# Patient Record
Sex: Female | Born: 1940
Health system: Southern US, Community
[De-identification: ages and names within clinical notes are randomized; demographics above are authoritative.]

## PROBLEM LIST (undated history)

## (undated) DIAGNOSIS — R002 Palpitations: Secondary | ICD-10-CM

## (undated) DIAGNOSIS — K219 Gastro-esophageal reflux disease without esophagitis: Secondary | ICD-10-CM

## (undated) DIAGNOSIS — R079 Chest pain, unspecified: Secondary | ICD-10-CM

## (undated) DIAGNOSIS — K5790 Diverticulosis of intestine, part unspecified, without perforation or abscess without bleeding: Secondary | ICD-10-CM

## (undated) DIAGNOSIS — T7840XA Allergy, unspecified, initial encounter: Secondary | ICD-10-CM

## (undated) DIAGNOSIS — H6060 Unspecified chronic otitis externa, unspecified ear: Secondary | ICD-10-CM

## (undated) DIAGNOSIS — K635 Polyp of colon: Secondary | ICD-10-CM

## (undated) HISTORY — DX: Palpitations: R00.2

## (undated) HISTORY — DX: Chest pain, unspecified: R07.9

## (undated) HISTORY — DX: Allergy, unspecified, initial encounter: T78.40XA

## (undated) HISTORY — DX: Polyp of colon: K63.5

## (undated) HISTORY — DX: Unspecified chronic otitis externa, unspecified ear: H60.60

## (undated) HISTORY — DX: Gastro-esophageal reflux disease without esophagitis: K21.9

## (undated) HISTORY — PX: BREAST SURGERY: SHX581

## (undated) HISTORY — PX: COLONOSCOPY W/ BIOPSIES: SHX1374

## (undated) HISTORY — PX: BUNIONECTOMY: SHX129

## (undated) HISTORY — DX: Diverticulosis of intestine, part unspecified, without perforation or abscess without bleeding: K57.90

---

## 1984-01-31 HISTORY — PX: CATARACT EXTRACTION, BILATERAL: SHX1313

## 2002-04-13 ENCOUNTER — Encounter: Payer: Self-pay | Admitting: Emergency Medicine

## 2002-04-13 ENCOUNTER — Observation Stay (HOSPITAL_COMMUNITY): Admission: EM | Admit: 2002-04-13 | Discharge: 2002-04-14 | Payer: Self-pay | Admitting: Emergency Medicine

## 2002-04-13 ENCOUNTER — Encounter: Payer: Self-pay | Admitting: Orthopedic Surgery

## 2004-05-16 ENCOUNTER — Ambulatory Visit: Payer: Self-pay | Admitting: Family Medicine

## 2004-06-13 ENCOUNTER — Ambulatory Visit: Payer: Self-pay | Admitting: Family Medicine

## 2004-11-30 ENCOUNTER — Encounter (INDEPENDENT_AMBULATORY_CARE_PROVIDER_SITE_OTHER): Payer: Self-pay | Admitting: *Deleted

## 2004-11-30 LAB — CONVERTED CEMR LAB

## 2004-12-01 ENCOUNTER — Ambulatory Visit: Payer: Self-pay | Admitting: Family Medicine

## 2005-01-03 ENCOUNTER — Encounter: Admission: RE | Admit: 2005-01-03 | Discharge: 2005-01-03 | Payer: Self-pay | Admitting: Sports Medicine

## 2005-09-18 ENCOUNTER — Ambulatory Visit: Payer: Self-pay | Admitting: Family Medicine

## 2005-09-18 HISTORY — PX: FRACTURE SURGERY: SHX138

## 2005-09-19 ENCOUNTER — Encounter: Admission: RE | Admit: 2005-09-19 | Discharge: 2005-09-19 | Payer: Self-pay | Admitting: Sports Medicine

## 2005-12-26 ENCOUNTER — Ambulatory Visit: Payer: Self-pay | Admitting: Family Medicine

## 2006-01-24 ENCOUNTER — Ambulatory Visit: Payer: Self-pay | Admitting: Family Medicine

## 2006-01-31 ENCOUNTER — Ambulatory Visit: Payer: Self-pay | Admitting: Sports Medicine

## 2006-01-31 ENCOUNTER — Encounter (INDEPENDENT_AMBULATORY_CARE_PROVIDER_SITE_OTHER): Payer: Self-pay | Admitting: Family Medicine

## 2006-01-31 LAB — CONVERTED CEMR LAB
ALT: 32 units/L (ref 0–35)
AST: 26 units/L (ref 0–37)
Albumin: 4.5 g/dL (ref 3.5–5.2)
Alkaline Phosphatase: 97 units/L (ref 39–117)
BUN: 14 mg/dL (ref 6–23)
CO2: 26 meq/L (ref 19–32)
Calcium: 9.7 mg/dL (ref 8.4–10.5)
Chloride: 104 meq/L (ref 96–112)
Cholesterol: 247 mg/dL — ABNORMAL HIGH (ref 0–200)
Creatinine, Ser: 0.74 mg/dL (ref 0.40–1.20)
Glucose, Bld: 80 mg/dL (ref 70–99)
HDL: 52 mg/dL (ref >39)
LDL Cholesterol: 154 mg/dL — ABNORMAL HIGH (ref 0–99)
Potassium: 4.2 meq/L (ref 3.5–5.3)
Sodium: 142 meq/L (ref 135–145)
TSH: 2.425 microintl units/mL (ref 0.350–5.50)
Total Bilirubin: 0.6 mg/dL (ref 0.3–1.2)
Total CHOL/HDL Ratio: 4.8
Total Protein: 7.4 g/dL (ref 6.0–8.3)
Triglycerides: 206 mg/dL — ABNORMAL HIGH (ref <150)
VLDL: 41 mg/dL — ABNORMAL HIGH (ref 0–40)

## 2006-02-15 ENCOUNTER — Encounter (INDEPENDENT_AMBULATORY_CARE_PROVIDER_SITE_OTHER): Payer: Self-pay | Admitting: Family Medicine

## 2006-02-15 ENCOUNTER — Ambulatory Visit: Payer: Self-pay | Admitting: Family Medicine

## 2006-02-28 ENCOUNTER — Encounter: Admission: RE | Admit: 2006-02-28 | Discharge: 2006-02-28 | Payer: Self-pay | Admitting: Sports Medicine

## 2006-03-29 DIAGNOSIS — H269 Unspecified cataract: Secondary | ICD-10-CM

## 2006-03-30 ENCOUNTER — Encounter (INDEPENDENT_AMBULATORY_CARE_PROVIDER_SITE_OTHER): Payer: Self-pay | Admitting: *Deleted

## 2006-09-19 HISTORY — PX: CHOLECYSTECTOMY: SHX55

## 2006-11-27 ENCOUNTER — Ambulatory Visit: Payer: Self-pay | Admitting: Family Medicine

## 2006-11-27 DIAGNOSIS — I10 Essential (primary) hypertension: Secondary | ICD-10-CM | POA: Insufficient documentation

## 2006-11-27 DIAGNOSIS — G44209 Tension-type headache, unspecified, not intractable: Secondary | ICD-10-CM | POA: Insufficient documentation

## 2006-11-27 DIAGNOSIS — H9190 Unspecified hearing loss, unspecified ear: Secondary | ICD-10-CM | POA: Insufficient documentation

## 2006-11-27 HISTORY — DX: Tension-type headache, unspecified, not intractable: G44.209

## 2006-11-27 HISTORY — DX: Unspecified hearing loss, unspecified ear: H91.90

## 2006-11-27 HISTORY — DX: Essential (primary) hypertension: I10

## 2006-11-29 ENCOUNTER — Encounter (INDEPENDENT_AMBULATORY_CARE_PROVIDER_SITE_OTHER): Payer: Self-pay | Admitting: *Deleted

## 2006-12-04 ENCOUNTER — Encounter (INDEPENDENT_AMBULATORY_CARE_PROVIDER_SITE_OTHER): Payer: Self-pay | Admitting: Family Medicine

## 2006-12-04 ENCOUNTER — Ambulatory Visit: Payer: Self-pay | Admitting: Family Medicine

## 2006-12-04 DIAGNOSIS — F529 Unspecified sexual dysfunction not due to a substance or known physiological condition: Secondary | ICD-10-CM | POA: Insufficient documentation

## 2006-12-04 LAB — CONVERTED CEMR LAB
ALT: 26 units/L (ref 0–35)
AST: 23 units/L (ref 0–37)
Albumin: 4.5 g/dL (ref 3.5–5.2)
Alkaline Phosphatase: 86 units/L (ref 39–117)
BUN: 16 mg/dL (ref 6–23)
CO2: 27 meq/L (ref 19–32)
Calcium: 9.6 mg/dL (ref 8.4–10.5)
Chloride: 106 meq/L (ref 96–112)
Cholesterol: 251 mg/dL — ABNORMAL HIGH (ref 0–200)
Creatinine, Ser: 0.85 mg/dL (ref 0.40–1.20)
Glucose, Bld: 86 mg/dL (ref 70–99)
HDL: 46 mg/dL (ref >39)
LDL Cholesterol: 159 mg/dL — ABNORMAL HIGH (ref 0–99)
Potassium: 4.2 meq/L (ref 3.5–5.3)
Sodium: 144 meq/L (ref 135–145)
Total Bilirubin: 0.5 mg/dL (ref 0.3–1.2)
Total CHOL/HDL Ratio: 5.5
Total Protein: 7.4 g/dL (ref 6.0–8.3)
Triglycerides: 229 mg/dL — ABNORMAL HIGH (ref <150)
VLDL: 46 mg/dL — ABNORMAL HIGH (ref 0–40)

## 2006-12-18 ENCOUNTER — Encounter: Admission: RE | Admit: 2006-12-18 | Discharge: 2006-12-18 | Payer: Self-pay | Admitting: Family Medicine

## 2006-12-18 ENCOUNTER — Ambulatory Visit: Payer: Self-pay | Admitting: Family Medicine

## 2006-12-18 DIAGNOSIS — M1990 Unspecified osteoarthritis, unspecified site: Secondary | ICD-10-CM | POA: Insufficient documentation

## 2006-12-18 DIAGNOSIS — M479 Spondylosis, unspecified: Secondary | ICD-10-CM

## 2006-12-18 HISTORY — DX: Spondylosis, unspecified: M47.9

## 2007-03-13 ENCOUNTER — Encounter: Admission: RE | Admit: 2007-03-13 | Discharge: 2007-03-13 | Payer: Self-pay | Admitting: Family Medicine

## 2007-07-22 ENCOUNTER — Ambulatory Visit: Payer: Self-pay | Admitting: Family Medicine

## 2007-11-14 ENCOUNTER — Encounter (INDEPENDENT_AMBULATORY_CARE_PROVIDER_SITE_OTHER): Payer: Self-pay | Admitting: Family Medicine

## 2007-11-14 ENCOUNTER — Ambulatory Visit: Payer: Self-pay | Admitting: Family Medicine

## 2007-11-14 DIAGNOSIS — R05 Cough: Secondary | ICD-10-CM

## 2007-11-14 DIAGNOSIS — K219 Gastro-esophageal reflux disease without esophagitis: Secondary | ICD-10-CM

## 2007-11-14 HISTORY — DX: Gastro-esophageal reflux disease without esophagitis: K21.9

## 2007-11-14 LAB — CONVERTED CEMR LAB
ALT: 23 units/L (ref 0–35)
AST: 25 units/L (ref 0–37)
Albumin: 4.6 g/dL (ref 3.5–5.2)
Alkaline Phosphatase: 95 units/L (ref 39–117)
BUN: 11 mg/dL (ref 6–23)
CO2: 27 meq/L (ref 19–32)
Calcium: 9.6 mg/dL (ref 8.4–10.5)
Chloride: 105 meq/L (ref 96–112)
Creatinine, Ser: 0.7 mg/dL (ref 0.40–1.20)
Direct LDL: 168 mg/dL — ABNORMAL HIGH
Glucose, Bld: 83 mg/dL (ref 70–99)
H Pylori IgG: NEGATIVE
Potassium: 4.4 meq/L (ref 3.5–5.3)
Sodium: 141 meq/L (ref 135–145)
Total Bilirubin: 0.9 mg/dL (ref 0.3–1.2)
Total Protein: 7.3 g/dL (ref 6.0–8.3)

## 2007-11-25 ENCOUNTER — Ambulatory Visit: Payer: Self-pay | Admitting: Family Medicine

## 2007-11-25 ENCOUNTER — Encounter (INDEPENDENT_AMBULATORY_CARE_PROVIDER_SITE_OTHER): Payer: Self-pay | Admitting: Family Medicine

## 2008-05-18 ENCOUNTER — Encounter: Admission: RE | Admit: 2008-05-18 | Discharge: 2008-05-18 | Payer: Self-pay | Admitting: Family Medicine

## 2008-06-01 ENCOUNTER — Ambulatory Visit: Payer: Self-pay | Admitting: Family Medicine

## 2008-06-01 DIAGNOSIS — N39498 Other specified urinary incontinence: Secondary | ICD-10-CM

## 2008-06-01 HISTORY — DX: Other specified urinary incontinence: N39.498

## 2008-06-01 LAB — CONVERTED CEMR LAB
Direct LDL: 143 mg/dL — ABNORMAL HIGH
Vit D, 25-Hydroxy: 19 ng/mL — ABNORMAL LOW (ref 30–89)

## 2008-06-24 ENCOUNTER — Encounter: Payer: Self-pay | Admitting: Family Medicine

## 2008-09-07 ENCOUNTER — Encounter: Payer: Self-pay | Admitting: Family Medicine

## 2008-11-09 ENCOUNTER — Ambulatory Visit: Payer: Self-pay | Admitting: Family Medicine

## 2008-11-09 DIAGNOSIS — J45901 Unspecified asthma with (acute) exacerbation: Secondary | ICD-10-CM

## 2008-11-09 DIAGNOSIS — J301 Allergic rhinitis due to pollen: Secondary | ICD-10-CM | POA: Insufficient documentation

## 2008-11-09 HISTORY — DX: Unspecified asthma with (acute) exacerbation: J45.901

## 2008-11-09 HISTORY — DX: Allergic rhinitis due to pollen: J30.1

## 2008-12-01 ENCOUNTER — Ambulatory Visit: Payer: Self-pay | Admitting: Family Medicine

## 2008-12-03 ENCOUNTER — Encounter: Payer: Self-pay | Admitting: Family Medicine

## 2008-12-07 ENCOUNTER — Encounter: Payer: Self-pay | Admitting: Family Medicine

## 2009-06-21 ENCOUNTER — Ambulatory Visit: Payer: Self-pay | Admitting: Family Medicine

## 2009-06-21 DIAGNOSIS — L723 Sebaceous cyst: Secondary | ICD-10-CM

## 2009-06-23 ENCOUNTER — Encounter: Admission: RE | Admit: 2009-06-23 | Discharge: 2009-06-23 | Payer: Self-pay | Admitting: Family Medicine

## 2009-06-29 ENCOUNTER — Ambulatory Visit: Payer: Self-pay | Admitting: Family Medicine

## 2009-06-29 ENCOUNTER — Encounter: Payer: Self-pay | Admitting: Family Medicine

## 2009-11-01 ENCOUNTER — Ambulatory Visit: Payer: Self-pay | Admitting: Family Medicine

## 2009-11-21 ENCOUNTER — Encounter: Payer: Self-pay | Admitting: Family Medicine

## 2010-01-17 ENCOUNTER — Ambulatory Visit: Payer: Self-pay | Admitting: Family Medicine

## 2010-02-01 ENCOUNTER — Ambulatory Visit (HOSPITAL_COMMUNITY)
Admission: RE | Admit: 2010-02-01 | Discharge: 2010-02-01 | Payer: Self-pay | Source: Home / Self Care | Admitting: Family Medicine

## 2010-02-01 ENCOUNTER — Other Ambulatory Visit: Payer: Self-pay

## 2010-02-01 ENCOUNTER — Encounter: Payer: Self-pay | Admitting: Family Medicine

## 2010-02-01 ENCOUNTER — Ambulatory Visit: Admission: RE | Admit: 2010-02-01 | Discharge: 2010-02-01 | Payer: Self-pay | Source: Home / Self Care

## 2010-02-01 DIAGNOSIS — R002 Palpitations: Secondary | ICD-10-CM | POA: Insufficient documentation

## 2010-02-01 DIAGNOSIS — R5381 Other malaise: Secondary | ICD-10-CM | POA: Insufficient documentation

## 2010-02-01 DIAGNOSIS — R5383 Other fatigue: Secondary | ICD-10-CM

## 2010-02-01 HISTORY — DX: Palpitations: R00.2

## 2010-02-04 ENCOUNTER — Encounter: Payer: Self-pay | Admitting: Family Medicine

## 2010-02-04 ENCOUNTER — Ambulatory Visit: Admission: RE | Admit: 2010-02-04 | Discharge: 2010-02-04 | Payer: Self-pay | Source: Home / Self Care

## 2010-02-24 ENCOUNTER — Encounter: Payer: Self-pay | Admitting: Family Medicine

## 2010-02-24 LAB — CONVERTED CEMR LAB
ALT: 19 units/L (ref 0–35)
AST: 20 units/L (ref 0–37)
Albumin: 4 g/dL (ref 3.5–5.2)
Alkaline Phosphatase: 63 units/L (ref 39–117)
BUN: 13 mg/dL (ref 6–23)
CO2: 26 meq/L (ref 19–32)
Calcium: 8.8 mg/dL (ref 8.4–10.5)
Chloride: 107 meq/L (ref 96–112)
Cholesterol: 241 mg/dL — ABNORMAL HIGH (ref 0–200)
Creatinine, Ser: 0.74 mg/dL (ref 0.40–1.20)
Glucose, Bld: 78 mg/dL (ref 70–99)
HCT: 45.8 % (ref 36.0–46.0)
HDL: 50 mg/dL (ref >39)
Hemoglobin: 14.8 g/dL (ref 12.0–15.0)
LDL Cholesterol: 157 mg/dL — ABNORMAL HIGH (ref 0–99)
MCV: 91.2 fL (ref 78.0–100.0)
Platelets: 194 10*3/uL (ref 150–400)
RDW: 13.3 % (ref 11.5–15.5)
TSH: 1.388 microintl units/mL (ref 0.350–4.500)
Total Bilirubin: 0.8 mg/dL (ref 0.3–1.2)
Total CHOL/HDL Ratio: 4.8
Total Protein: 6.2 g/dL (ref 6.0–8.3)
Triglycerides: 168 mg/dL — ABNORMAL HIGH (ref <150)
VLDL: 34 mg/dL (ref 0–40)
WBC: 6.2 10*3/uL (ref 4.0–10.5)

## 2010-03-01 NOTE — Assessment & Plan Note (Signed)
Summary: boil on back shoulder/[place]   Vital Signs:  Patient profile:   70 year old female Menstrual status:  postmenopausal Height:      137 inches Weight:      128.3 pounds Temp:     98.1 degrees F Pulse rate:   80 / minute BP sitting:   122 / 76  Vitals Entered By: Golden Circle RN ([name] 21, 2009 2:57 PM)  Primary Care Provider:  Zachery Dauer MD   History of Present Illness: Visit conducted in [place].   Patient with longstanding (several years) epidermoid cyst on the R shoulder which had not been bothering her.  In the past 2 weeks the area has become tender and red; she reports a small amount of purulence oozing from the area in recent days.  Denies objective fevers or chills.  Denies feeling ill otherwise.   No known bleeding disorders, no anticoagulant medications.  Uses Ventolin as needed, not using now.   Current Medications (verified): 1)  Ibuprofen 200 Mg Tabs (Ibuprofen) .... 2 As Needed Headache 2)  Ventolin Hfa 108 (90 Base) Mcg/act Aers (Albuterol Sulfate) .... 2 Puffs As Needed Shortness of Breath  Allergies (verified): 1)  ! Amitriptyline Hcl 2)  Fish Oil (Fish Oil) 3)  * Simvastatin 4)  Lipitor (Atorvastatin Calcium)  Physical Exam  General:  well appearing, no apparent distress.  Neck:  Neck supple.  Msk:  R upper back with 1.5cm2 round fluctuant mass, erythematous and tender to touch.  No surrounding skin erythema. Not suppurative.    Additional Exam:  After discussing risks and benefits to I&D of infected epidermoid cyst, the patient gives verbal and written consent to proceed with procedure.  Consent done in Spanish, and patient given opportunity to ask questions.   Area prepped with betadine, infiltrated with 1%lidocaine with epinephrine.  After adequate anesthesia, 11=blade used to incise a 1cm incision across the most fluctuant part of the lesion.  Cheesy contents evacuated and the walls of the cyst are scraped with the scalpel.  EBL minimal.   Patient reports relief from pain of the distended capsule.  Tolerated procedure well.  Topical antibiotics and gauze covering applied by nursing staff. Given instructions on follow-up.     Impression & Recommendations:  Problem # 1:  EPIDERMOID CYST, BACK (ICD-706.2)  Epidermoid cyst on R upper back, measuring 1.5cm squared, with fluctuance and erythema.  Area incised and drained after verbal and written consent, and application of 1% lido with epinephrine.  Will recheck in 1 week or sooner if needed.  Instructions about indications for earlier follow-up.  We discussed risk of bleeding, infection, injury and recurrence of the cyst.    Orders: [facility]- Est Level  3 (16109) I&D Abcess, simple- [facility] (10060)  Complete Medication List: 1)  Ibuprofen 200 Mg Tabs (Ibuprofen) .... 2 as needed headache 2)  Ventolin Hfa 108 (90 Base) Mcg/act Aers (Albuterol sulfate) .... 2 puffs as needed shortness of breath  Patient Instructions: 1)  Fue un placer verle hoy.  Vito Backers abri' el quiste que tenia infestado en el hombro derecho.  Quiero que vuelva en una semana para chequearle de nuevo.  2)  Si Ud nota que se le pone muy rojo, si duele mucho o si supura pus, o si tiene Ud sintomas de [place], Dentist o se empeora de otra forma, por favor llame para ser atendida lo antes posible.  3)  Mantenga el area tapado por 24 horas despues de la visita de hoy. Entonces puede banarse.  Puede mantener el area tapado por algunos dias para evitar que se le frote con la ropa/brasier. 4)  FOLLOW UP APPT IN 1 WEEK WITH DR Mauricio Po. WOUND CHECK AFTER INCISION AND DRAINAGE

## 2010-03-01 NOTE — Miscellaneous (Signed)
Summary: asthma classification  Clinical Lists Changes  Problems: Changed problem from EXTRINSIC ASTHMA, WITH EXACERBATION (ICD-493.02) to ASTHMA, INTERMITTENT, MILD (ICD-493.90)

## 2010-03-01 NOTE — Miscellaneous (Signed)
Summary: Procedure Consent  Procedure Consent   Imported By: De Nurse 06/30/2009 16:22:17  _____________________________________________________________________  External Attachment:    Type:   Image     Comment:   External Document

## 2010-03-01 NOTE — Miscellaneous (Signed)
Summary: walk in   Clinical Lists Changes walked in with spouse. showed me a large eruption on L shoulder under brastrap. states it is painful. it is raised tense and red. they have a band aid on it. [facility] as there was a cancellation today. they are fine with waiting.. BP 122/76 p80 temp 98.1 wt 128.3.Marland KitchenMarland KitchenGolden Circle RN  [name] 21, 2009 2:36 PM

## 2010-03-01 NOTE — Assessment & Plan Note (Signed)
Summary: F/U WOUND/[facility]   Vital Signs:  Patient profile:   70 year old female Menstrual status:  postmenopausal Weight:      128 pounds Pulse rate:   77 / minute BP sitting:   134 / 82  (left arm)  Vitals Entered By: Arlyss Repress CMA, ([name] 29, 2009 8:38 AM) CC: f/up boil...on back.  Is Patient Diabetic? No Pain Assessment Patient in pain? yes     Location: back Intensity: 2   Primary Care Provider:  Zachery Dauer MD  CC:  f/up boil...on back. .  History of Present Illness: Visit in Spanish.  Husband Lawanna Kobus present for part of the visit.   Hendy reports that the site of I&D on R upper back is better than it was; still with an uncomfortable itching sensation.  No suppuration for a few days now.  Husband has been putting iodine on it.  No fevers or chills.   Reports that she gets short of breath almost nightly, has to use Albuterol HFA nearly every night.  Gets better with this. Worse from March to May/June.  Never had asthma before.  [facility] in Nov 2010 [facility] without reversibility with SABA use.  Attributed her shortness of breath to reflux.   She was seen in an urgent care on [address] in March for the shortness of breath, was told she had hypertension and started on HCTZ 12.5mg  daily.  She has been taking, and her BP is better controlled.   Habits & Providers  Alcohol-Tobacco-Diet     Tobacco Status: quit > 6 months     Tobacco Counseling: not to resume use of tobacco products  Current Medications (verified): 1)  Ibuprofen 200 Mg Tabs (Ibuprofen) .... 2 As Needed Headache 2)  Ventolin Hfa 108 (90 Base) Mcg/act Aers (Albuterol Sulfate) .... 2 Puffs As Needed Shortness of Breath 3)  Bactrim Ds 800-160 Mg Tabs (Sulfamethoxazole-Trimethoprim) .... 2 Tabs By Mouth Two Times A Day For 7 Days 4)  Hydrochlorothiazide 12.5 Mg Tabs (Hydrochlorothiazide) .Marland Kitchen.. 1 Cap By Mouth Daily  Allergies (verified): 1)  ! Amitriptyline Hcl 2)  Fish Oil (Fish Oil) 3)  * Simvastatin 4)   Lipitor (Atorvastatin Calcium)  Social History: Smoking Status:  quit > 6 months  Physical Exam  General:  well appearing, no apparent distress Neck:  Neck supple.  Lungs:  Good air movement.  Trace scattered expiratory wheezes without rales.  Heart:  Normal rate and regular rhythm. S1 and S2 normal without gallop, murmur, click, rub or other extra sounds. Skin:  R upper back; area of erythema that blanches, measures 1x1.5cm.  No suppuration.     Impression & Recommendations:  Problem # 1:  EPIDERMOID CYST, BACK (ICD-706.2)  Improved from date of I&D.  Plan to add systemic bactrim for 1 week due to what appears to be early cellulitis. No signs/symptoms of systemic infection.  Orders: [facility]- Est Level  3 (16109)  Problem # 2:  EXTRINSIC ASTHMA, WITH EXACERBATION (ICD-493.02)  Question RAD associated wtih GERD, also appears to have a seasonal component in Spring.  She reports subjective improvement with albuterol HFA, whcih she is using nearly daily.  May consider formal PFTs to see if corroborates the Spirometry done in this office in Nov 2010.  For now, to increase H1blocker use and schedule follow-up in the coming. month.  Her updated medication list for this problem includes:    Ventolin Hfa 108 (90 Base) Mcg/act Aers (Albuterol sulfate) .Marland Kitchen... 2 puffs as needed shortness of  breath  Orders: [facility]- Est Level  3 (95284)  Problem # 3:  INCREASED BLOOD PRESSURE (ICD-796.2) Given HCTZ at Urgent Care on [address] She has been taking daily. BP controlled today.  Continue for now.  Her updated medication list for this problem includes:    Hydrochlorothiazide 12.5 Mg Tabs (Hydrochlorothiazide) .Marland Kitchen... 1 cap by mouth daily  Complete Medication List: 1)  Ibuprofen 200 Mg Tabs (Ibuprofen) .... 2 as needed headache 2)  Ventolin Hfa 108 (90 Base) Mcg/act Aers (Albuterol sulfate) .... 2 puffs as needed shortness of breath 3)  Bactrim Ds 800-160 Mg Tabs (Sulfamethoxazole-trimethoprim) .... 2  tabs by mouth two times a day for 7 days 4)  Hydrochlorothiazide 12.5 Mg Tabs (Hydrochlorothiazide) .Marland Kitchen.. 1 cap by mouth daily  Patient Instructions: 1)  Fue un placer verle hoy.  Creo que la herida en la espalda se esta' sanando bien hoy.  Estoy recomendando que tome el antibiotico Bactrim DS, 2 tabletas dos veces por dia, por una semana.  2)  Continue usando la bombita de albuterol cuando la necesite para la dificultad en respirar.  3)  Puede usar un antihistaminico (Zyrtec) 10mg  una vez por dia.  4)  Elfredia Nevins que vuelva con el Dr Sheffield Slider para seguimiento del tema de los ronquidos y [place]. Prescriptions: BACTRIM DS 800-160 MG TABS (SULFAMETHOXAZOLE-TRIMETHOPRIM) 2 tabs by mouth two times a day for 7 days  #28 x 0   Entered and Authorized by:   Paula Compton MD   Signed by:   Paula Compton MD on 06/29/2009   Method used:   Electronically to        [facility] [phone]* (retail)       [address]       [place], [place]  [address]       Ph: [phone]       Fax: [phone]   RxID:   [id number]

## 2010-03-01 NOTE — Assessment & Plan Note (Signed)
Summary: FLU SHOT/[facility]  Nurse Visit   Vital Signs:  Patient profile:   70 year old female Menstrual status:  postmenopausal Temp:     98.5 degrees F  Vitals Entered By: Theresia Lo RN (November 01, 2009 4:30 PM)  Allergies: 1)  ! Amitriptyline Hcl 2)  Fish Oil (Fish Oil) 3)  * Simvastatin 4)  Lipitor (Atorvastatin Calcium)  Immunizations Administered:  Influenza Vaccine # 1:    Vaccine Type: Fluvax MCR    Site: left deltoid    Mfr: GlaxoSmithKline    Dose: 0.5 ml    Route: IM    Given by: Theresia Lo RN    Exp. Date: 07/27/2010    Lot #: [id number]    VIS given: 08/24/09 version given November 01, 2009.  Flu Vaccine Consent Questions:    Do you have a history of severe allergic reactions to this vaccine? no    Any prior history of allergic reactions to egg and/or gelatin? no    Do you have a sensitivity to the preservative Thimersol? no    Do you have a past history of Guillan-Barre Syndrome? no    Do you currently have an acute febrile illness? no    Have you ever had a severe reaction to latex? no    Vaccine information given and explained to patient? yes    Are you currently pregnant? no  Orders Added: 1)  Influenza Vaccine MCR [00025] 2)  Administration Flu vaccine - MCR [G0008]

## 2010-03-03 NOTE — Assessment & Plan Note (Signed)
Summary: F/U ON ASTHMA/[facility]   Vital Signs:  Patient profile:   70 year old female Menstrual status:  postmenopausal Height:      66.25 inches Weight:      122 pounds BMI:     19.61 Temp:     98.1 degrees F oral Pulse rate:   84 / minute BP sitting:   156 / 81  (left arm) BP standing:   130 / 76 Cuff size:   regular  Vitals Entered By: Tessie Fass CMA (February 01, 2010 2:54 PM)  Serial Vital Signs/Assessments:  Time      Position  BP       Pulse  Resp  Temp     By 3:08 PM             144/80                         Zachery Dauer MD           Lying LA  136/78                         Zachery Dauer MD           Standing  130/76                         Zachery Dauer MD                                PEF    PreRx  PostRx Time      O2 Sat  O2 Type     L/min  L/min  L/min   By 2:55 PM                       380                   Tessie Fass CMA 2:55 PM                       400                   Molly Maduro Busick CMA 2:55 PM                       380                   Molly Maduro Busick CMA  CC: F/U asthma   Primary Care Provider:  Zachery Dauer MD  CC:  F/U asthma.  History of Present Illness: Breathing much better. Finished Prednisone day before yesterday. Blowing up to 400 on peak flows.  Does have occasional chest pressure not associated with exertion and nocturnal cough.   Feels mildly dizzy with standing and vision gets blurry. Also some spinning feeling. One episode of palpitations  Feels week in the legs at times.   Allergies: 1)  ! Amitriptyline Hcl 2)  Fish Oil (Fish Oil) 3)  * Simvastatin 4)  Lipitor (Atorvastatin Calcium)  Physical Exam  General:  well appearing, no apparent distress Neck:  No thyromegaly Lungs:  normal respiratory effort, no crackles, and no wheezes.   Heart:  regular rhythm and no murmur.   Abdomen:  Mildly tender in the epigastrium that she attributes to cough.  Extremities:  No clubbing,  cyanosis, edema, or deformity noted with normal full range of  motion of all joints.     Impression & Recommendations:  Problem # 1:  ASTHMA, PERSISTENT, MILD (ICD-493.90) Much improved, but still consistent with mild persistent asthma and needs a controller The following medications were removed from the medication list:    Prednisone 20 Mg Tabs (Prednisone) .Marland Kitchen... Take 3 tabs daily for 3 days then 2 daily for 3 days then one daily till gone Her updated medication list for this problem includes:    Proventil Hfa 108 (90 Base) Mcg/act Aers (Albuterol sulfate) .Marland Kitchen... Take 2 puffs every 4 hr as needed    Ipratropium-albuterol 0.5-2.5 (3) Mg/34ml Soln (Ipratropium-albuterol) ..... Use in nebulizer every 4 hr as needed    Qvar 40 Mcg/act Aers (Beclomethasone dipropionate) .Marland Kitchen... Take 2 inhalations two times a day  Orders: [facility]- Est Level  3 (04540)  Problem # 2:  PALPITATIONS (ICD-785.1) EKG normal.Needs recheck of lipids Orders: EKG- [facility] (EKG) [facility]- Est Level  3 (99213)Future Orders: TSH-[facility] ([id number]) ... 02/04/2011  Complete Medication List: 1)  Ibuprofen 200 Mg Tabs (Ibuprofen) .... 2 as needed headache 2)  Proventil Hfa 108 (90 Base) Mcg/act Aers (Albuterol sulfate) .... Take 2 puffs every 4 hr as needed 3)  Ipratropium-albuterol 0.5-2.5 (3) Mg/94ml Soln (Ipratropium-albuterol) .... Use in nebulizer every 4 hr as needed 4)  Qvar 40 Mcg/act Aers (Beclomethasone dipropionate) .... Take 2 inhalations two times a day  Other Orders: Lipid-[facility] (95621-30865) Future Orders: CBC-[facility] (78469) ... 02/04/2011 Comp Met-[facility] ([id number]) ... 02/04/2011  Patient Instructions: 1)  Return in 2 days for fasting labs  2)  Please schedule a follow-up appointment in 1 month.  3)  Voy a [employer] [place]bas de [place]. Prescriptions: QVAR 40 MCG/ACT AERS (BECLOMETHASONE DIPROPIONATE) Take 2 inhalations two times a day  #1 x 11   Entered and Authorized by:   Zachery Dauer MD   Signed by:   Zachery Dauer MD on 02/01/2010   Method used:    Electronically to        [facility] [address] [phone]* (retail)       [address]       [address]       [place], [place]  [address]       Ph: [phone]       Fax: [phone]   RxID:   [id number] QVAR 40 MCG/ACT AERS (BECLOMETHASONE DIPROPIONATE) Take 2 inhalations two times a day  #1 x 11   Entered and Authorized by:   Zachery Dauer MD   Signed by:   Zachery Dauer MD on 02/01/2010   Method used:   [facility]ron[facility] to        [facility]  [address] [id number] (retail)       [address]       [place], [place]  [address]       Ph: [phone]       Fax: [phone]   RxID:   [id number]    Orders Added: 1)  EKG- [facility] [EKG] 2)  [facility]- Est Level  3 [99213] 3)  Lipid-[facility] [80061-22930] 4)  CBC-[facility] [85027] 5)  Comp Met-[facility] [80053-22900] 6)  TSH-[facility] [[id number]]

## 2010-03-03 NOTE — Assessment & Plan Note (Signed)
Summary: [place] for asthma attack and refill/kf   Vital Signs:  Patient profile:   70 year old female Menstrual status:  postmenopausal Height:      66.25 inches Weight:      122.4 pounds BMI:     19.68 O2 Sat:      94 % on Room air Temp:     99.1 degrees F oral Pulse rate:   80 / minute BP sitting:   130 / 80  (right arm)  Vitals Entered By: Arlyss Repress CMA, (January 17, 2010 1:54 PM)  O2 Flow:  Room air CC: asthma. cough x 3 weeks. Is Patient Diabetic? No Pain Assessment Patient in pain? yes     Location: chest Intensity: 8 Onset of pain  coughing    Primary Care Provider:  Zachery Dauer MD  CC:  asthma. cough x 3 weeks.Marland Kitchen  History of Present Illness: 3 weeks ago she had a uri with cough that didn't improve. 2 weeks ago she began increasing chest discomfort, dyspnea when lying, and wheezing. Despite using Alb/Ipratropium nebs (last at 8 AM)  or Albuterol MDI (last at noon) she increasingly feels that she is drowning. No fever. White phlegm. Poor appetite.   No ankle edema.   Interview conducted in Spanish  Asthma History    Initial Asthma Severity Rating:    Age range: 12+ years    Symptoms: >2 days/week; not daily    Nighttime Awakenings: >1/week but not nightly    Interferes w/ normal activity: some limitations    Asthma Severity Assessment: Moderate Persistent    Habits & Providers  Alcohol-Tobacco-Diet     Tobacco Status: never  Allergies: 1)  ! Amitriptyline Hcl 2)  Fish Oil (Fish Oil) 3)  * Simvastatin 4)  Lipitor (Atorvastatin Calcium)  Family History: MI, sisters x 5 alive, 2 had breast cancer - ages 21, 3 mother died at 30 of MI father AT 7 stroke, brother at 72  Social History: Live in [place] ,with husband (married for 30 years) from [place] in 2004 where she met her husband. 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    Jehova's  witnesses (do no received blood products) Born in [place] but husband born in [place].  Smoking Status:  never  Physical Exam  Mouth:  Oral mucosa and oropharynx without lesions or exudates.  Teeth in good repair. Lungs:  no crackles, R wheezes, and L wheezes.  Moderately prolonged exp. Initial peak flow max 210. Improved to 245 after alb/Ipra neb.  Heart:  Normal rate and regular rhythm. S1 and S2 normal without gallop, murmur, click, rub or other extra sounds. Abdomen:  soft, non-tender, no distention, no hepatomegaly, no splenomegaly, and RUQ abdominal surgical scar.     Impression & Recommendations:  Problem # 1:  ASTHMA, PERSISTENT, MILD (ICD-493.90) Acute exacerbation Her updated medication list for this problem includes:    Proventil Hfa 108 (90 Base) Mcg/act Aers (Albuterol sulfate) .Marland Kitchen... Take 2 puffs every 4 hr as needed    Ipratropium-albuterol 0.5-2.5 (3) Mg/71ml Soln (Ipratropium-albuterol) ..... Use in nebulizer every 4 hr as needed    Prednisone 20 Mg Tabs (Prednisone) .Marland Kitchen... Take 3 tabs daily for 3 days then 2 daily for 3 days then one daily till gone  Problem # 2:  ALLERGIC RHINITIS, SEASONAL (ICD-477.0)  Problem # 3:  GERD (ICD-530.81)  Orders: [facility]- Est  Level  4 (91478)  Complete Medication List: 1)  Ibuprofen 200 Mg Tabs (Ibuprofen) .... 2 as needed headache 2)  Proventil Hfa 108 (90 Base) Mcg/act Aers (Albuterol sulfate) .... Take 2 puffs every 4 hr as needed 3)  Ipratropium-albuterol 0.5-2.5 (3) Mg/79ml Soln (Ipratropium-albuterol) .... Use in nebulizer every 4 hr as needed 4)  Prednisone 20 Mg Tabs (Prednisone) .... Take 3 tabs daily for 3 days then 2 daily for 3 days then one daily till gone  Other Orders: Albuterol Sulfate Sol 1mg  unit dose (G9562) Atrovent 1mg  (Neb) [phone])  Patient Instructions: 1)  Usar albuterol por inhaladora o albuterol/Ipratropium por nebulizadora cada cuatro horas para asma 2)  Usar Prednisone para inflamacion en los  pulmones hasta esta has completado la receta.  3)  Va a la sala de urgencia si no puede soplar mas de 200 cc en la machina despues del tratamiento.  4)  Regrese el 3 de enero.  5)  Recheck Jan 3rd.  Prescriptions: PROVENTIL HFA 108 (90 BASE) MCG/ACT AERS (ALBUTEROL SULFATE) Take 2 puffs every 4 hr as needed  #1 x 11   Entered and Authorized by:   Zachery Dauer MD   Signed by:   Zachery Dauer MD on 01/17/2010   Method used:   Print then Give to Patient   RxID:   [id number] PREDNISONE 20 MG TABS (PREDNISONE) Take 3 tabs daily for 3 days then 2 daily for 3 days then one daily till gone  #21 x 0   Entered and Authorized by:   Zachery Dauer MD   Signed by:   Zachery Dauer MD on 01/17/2010   Method used:   Electronically to        [facility] [address] [phone]* (retail)       [address]       [address]       [place], [place]  [address]       Ph: [phone]       Fax: [phone]   RxID:   [id number] IPRATROPIUM-ALBUTEROL 0.5-2.5 (3) MG/3ML SOLN (IPRATROPIUM-ALBUTEROL) Use in nebulizer every 4 hr as needed  #1 box x 11   Entered and Authorized by:   Zachery Dauer MD   Signed by:   Zachery Dauer MD on 01/17/2010   Method used:   [facility]        [facility] [address] [phone]* (retail)       [address]       [place], [place]  [address]       Ph: [phone] or [phone]       Fax: [phone]   RxID:   [id number] PROVENTIL HFA 108 (90 BASE) MCG/ACT AERS (ALBUTEROL SULFATE) Take 2 puffs every 4 hr as needed  #1 x 11   Entered and Authorized by:   Zachery Dauer MD   Signed by:   Zachery Dauer MD on 01/17/2010   Method used:   [facility]ically to        [facility] [address] [phone]* (retail)       [address]       [place], [place]  [address]       Ph: [phone] or [phone]       Fax: [phone]   RxID:   [id number]    Medication Administration  Medication # 1:    Medication: Albuterol Sulfate Sol 1mg  unit dose  Diagnosis: ASTHMA, INTERMITTENT, MILD (ICD-493.90)    Dose: 2.5mg     Route: inhaled    Exp Date: 03/2011    Lot #: [id number]    Mfr: nephron    Comments: 2.5mg /7ml given    Patient tolerated medication without complications    Given by: Tessie Fass CMA (January 17, 2010 2:33 PM)  Medication # 2:    Medication: Atrovent 1mg  (Neb)    Diagnosis: ASTHMA, INTERMITTENT, MILD (ICD-493.90)    Dose: 0.5mg     Route: inhaled    Exp Date: 07/2011    Lot #: [id number]    Mfr: nephron    Comments: 0.5mg /2.21ml given    Patient tolerated medication without complications    Given by: Tessie Fass CMA (January 17, 2010 2:34 PM)  Orders Added: 1)  Albuterol Sulfate Sol 1mg  unit dose [J7613] 2)  Atrovent 1mg  (Neb) [F6213] 3)  [facility]- Est  Level 4 [08657]

## 2010-03-03 NOTE — Letter (Signed)
Summary: Results Follow-up Letter  [facility]  [address]   [place], [place] [address]   Phone: [phone]  Fax: [phone]    02/24/2010  [address] [place], [place]  [address]  Dear [name],   The following are the results of your recent test(s): Patient: Anne Warren Note: All result statuses are Final unless otherwise noted.  Tests: (1) CBC NO Diff (Complete Blood Count) (10000)   Order Note: FASTING   WBC                       6.2 K/uL                    4.0-10.5   RBC                       5.02 MIL/uL                 3.87-5.11   Hemoglobin                14.8 g/dL                   62.9-52.8   Hematocrit                45.8 %                      36.0-46.0   MCV                       91.2 fL                     78.0-100.0 ! MCH                       29.5 pg                     26.0-34.0   MCHC                      32.3 g/dL                   41.3-24.4   RDW                       13.3 %                      11.5-15.5   Platelet Count            194 K/uL                    150-400 No padece de anemia. Tests: (2) Comprehensive Metabolic Panel (01027)   Sodium                    142 mEq/L                   135-145   Potassium                 5.0 mEq/L                   3.5-5.3   Chloride                  107 mEq/L  96-112   CO2                       26 mEq/L                    19-32   Glucose                   78 mg/dL                    16-10   BUN                       13 mg/dL                    9-60   Creatinine                0.74 mg/dL                  0.40-1.20   Bilirubin, Total          0.8 mg/dL                   4.5-4.0   Alkaline Phosphatase      63 U/L                      39-117   AST/SGOT                  20 U/L                      0-37   ALT/SGPT                  19 U/L                      0-35   Total Protein             6.2 g/dL                    9.8-1.1   Albumin                   4.0 g/dL                     9.1-4.7   Calcium                   8.8 mg/dL                   8.2-95.6 La funcion del higado y rinones queda normal. Tests: (3) Lipid Profile (21308)   Cholesterol          [H]  241 mg/dL                   6-578     ATP III Classification:           < 200        mg/dL        Desirable          200 - 239     mg/dL        Borderline High          >= 240        mg/dL        High         Triglyceride         [  H]  168 mg/dL                   <161   HDL Cholesterol           50 mg/dL                    >09   Total Chol/HDL Ratio      4.8 Ratio  VLDL Cholesterol (Calc)                             34 mg/dL                    6-04  LDL Cholesterol (Calc)                        [H]  157 mg/dL                   5-40           Total Cholesterol/HDL Ratio:CHD Risk                            Coronary Heart Disease Risk Table                                            Men       Women              1/2 Average Risk              3.4        3.3                  Average Risk              5.0        4.4              2 X Average Risk              9.6        7.1              3 X Average Risk             23.4       11.0     Use the calculated Patient Ratio above and the CHD Risk table      to determine the patient's CHD Risk.     ATP III Classification (LDL):           < 100        mg/dL         Optimal          100 - 129     mg/dL         Near or Above Optimal          130 - 159     mg/dL         Borderline High          160 - 189     mg/dL         High           > 190        mg/dL  Very High El nivel de su colesterol malo, LDL queda alta. Porque no tiene otras factores de riesgo su meta es un nivel menos de 160. Muy importante evitar comida con grasa saturada.       Tests: (4) TSH (23280)   TSH                       1.388 uIU/mL                0.350-4.500 Funcion de la glandula tiroides esta normal. Document Creation Date: 02/04/2010 11:25 PM Sincerely,  Zachery Dauer MD [facility]           Appended Document: Results Follow-up Letter mailed

## 2010-03-03 NOTE — Miscellaneous (Signed)
Summary: Pt needs refill for proventil  Pt came by today and needs refill of Proventil. Anne Warren  January 17, 2010 1:31 PM    Done in office visit

## 2010-04-11 ENCOUNTER — Encounter: Payer: Self-pay | Admitting: Home Health Services

## 2010-04-21 ENCOUNTER — Encounter: Payer: Self-pay | Admitting: Family Medicine

## 2010-04-21 ENCOUNTER — Ambulatory Visit (INDEPENDENT_AMBULATORY_CARE_PROVIDER_SITE_OTHER): Payer: Medicare Other | Admitting: Family Medicine

## 2010-04-21 VITALS — BP 148/84 | HR 76 | Temp 98.0°F | Ht 59.25 in | Wt 123.0 lb

## 2010-04-21 DIAGNOSIS — M479 Spondylosis, unspecified: Secondary | ICD-10-CM

## 2010-04-21 DIAGNOSIS — J4530 Mild persistent asthma, uncomplicated: Secondary | ICD-10-CM

## 2010-04-21 DIAGNOSIS — R05 Cough: Secondary | ICD-10-CM

## 2010-04-21 DIAGNOSIS — M5412 Radiculopathy, cervical region: Secondary | ICD-10-CM

## 2010-04-21 DIAGNOSIS — J45909 Unspecified asthma, uncomplicated: Secondary | ICD-10-CM

## 2010-04-21 DIAGNOSIS — R030 Elevated blood-pressure reading, without diagnosis of hypertension: Secondary | ICD-10-CM

## 2010-04-21 DIAGNOSIS — G44209 Tension-type headache, unspecified, not intractable: Secondary | ICD-10-CM

## 2010-04-21 DIAGNOSIS — R5383 Other fatigue: Secondary | ICD-10-CM

## 2010-04-21 DIAGNOSIS — R059 Cough, unspecified: Secondary | ICD-10-CM

## 2010-04-21 HISTORY — DX: Radiculopathy, cervical region: M54.12

## 2010-04-21 MED ORDER — BECLOMETHASONE DIPROPIONATE 80 MCG/ACT IN AERS: 2.0000 | INHALATION_SPRAY | Freq: Two times a day (BID) | RESPIRATORY_TRACT | Status: DC

## 2010-04-21 MED ORDER — ACETAMINOPHEN 500 MG PO TABS: 1000.0000 mg | ORAL_TABLET | Freq: Four times a day (QID) | ORAL | Status: AC | PRN

## 2010-04-21 MED ORDER — GABAPENTIN 300 MG PO CAPS: 300.0000 mg | ORAL_CAPSULE | Freq: Three times a day (TID) | ORAL | Status: DC

## 2010-04-21 MED ORDER — PROMETHAZINE-DM 6.25-15 MG/5ML PO SYRP: 5.0000 mL | ORAL_SOLUTION | Freq: Four times a day (QID) | ORAL | Status: AC | PRN

## 2010-04-21 MED ORDER — PREDNISONE 20 MG PO TABS: ORAL_TABLET | ORAL | Status: DC

## 2010-04-21 MED ORDER — GABAPENTIN (ONCE-DAILY) 300 MG PO TABS: 1.0000 | ORAL_TABLET | Freq: Two times a day (BID) | ORAL | Status: DC | PRN

## 2010-04-21 NOTE — Assessment & Plan Note (Signed)
Put under asthma

## 2010-04-21 NOTE — Progress Notes (Signed)
  Subjective:    Patient ID: Anne Warren, female    DOB: 01/31/1941, 70 y.o.   MRN: [id number]  HPIShe has had worsening of her asthma sx recently with recurrence of her itchy eyes, sneezing, congestion that she generally has in the spring. She has been taking Zyrtec, but not consistently. Is taking the Qvar 40 and the Albuterol works well for rescue. The Ipratropium in the Duoneb seems to cause headache. Can't sleep at night due to the cough which also causes nausea. She requests more Phenergan cough syrup.  On the January visit after a course of prednisone, her peak flow was 400. Predicted for her height and age is 350. Excedrine headache medicine without caffeine has been helping.   She also has headache at other times with pain in her L neck and upper arm with numbness down into her L middle finger. These sx began 23 years ago after a fall onto her face and the doctor then said that she has a pinched nerve. No bowel or bladder or leg sx are now present.    history of injection years ago into her L palm for trigger finger sx, still has trouble straightening that middle finger at times.   Review of Systems     Objective:   Physical Exam  Constitutional: She is oriented to person, place, and time.  Eyes: Conjunctivae and EOM are normal. Pupils are equal, round, and reactive to light.    Neck: Normal range of motion. No mass and no thyromegaly present.    Cardiovascular: Normal rate, regular rhythm and normal heart sounds.   No murmur heard. Pulmonary/Chest: No accessory muscle usage. No respiratory distress. She has wheezes. She has no rales.  Neurological: She is alert and oriented to person, place, and time. She displays abnormal reflex. She displays no atrophy and no tremor. No cranial nerve deficit or sensory deficit. She exhibits normal muscle tone.  Reflex Scores:      Tricep reflexes are 1+ on the right side and 1+ on the left side.      Bicep reflexes are 1+ on the right side  and 1+ on the left side.      Brachioradialis reflexes are 1+ on the right side and 1+ on the left side.      Weaker flexion and extension on Left          Assessment & Plan:

## 2010-04-21 NOTE — Assessment & Plan Note (Signed)
Likely has spondylosis contributing to radiculopathy, Consider MRI if sx worsen or more definite weakness

## 2010-04-21 NOTE — Assessment & Plan Note (Signed)
Asthma exacerbation with more allergic rhinitis sx. Needs more controller medication. Ipratropium not working well and Duoneb causes headache.

## 2010-04-21 NOTE — Progress Notes (Signed)
  Subjective:    Patient ID: Anne Warren, female    DOB: 05/03/1940, 70 y.o.   MRN: [id number]  HPI    Review of Systems     Objective:   Physical Exam        Assessment & Plan:  Prior height was incorrect

## 2010-04-21 NOTE — Patient Instructions (Addendum)
Por el asma:  -Deje de usar Iprotropium por la nebulizadora.   -Vamos a Adult nurse QVAR (dos [employer] veces por dia)  Por las alergias: tome Zyrtec todos los dias.  Por el dolor de cuello: tome la medicine Neurontin antes de dormir.   Favor de regresar a Facilities manager. (FOLLOW-UP IN 3 WEEKS).

## 2010-04-21 NOTE — Assessment & Plan Note (Signed)
Ipratropium can cause this and she doesn't have and indication for its use so will discontinue. I recommended Acetaminophen

## 2010-04-21 NOTE — Assessment & Plan Note (Signed)
Systolic elevated today. Will recheck when not acutely ill

## 2010-04-21 NOTE — Assessment & Plan Note (Signed)
resolved 

## 2010-04-22 ENCOUNTER — Encounter: Payer: Self-pay | Admitting: Home Health Services

## 2010-04-22 ENCOUNTER — Ambulatory Visit (INDEPENDENT_AMBULATORY_CARE_PROVIDER_SITE_OTHER): Payer: Medicare Other | Admitting: Home Health Services

## 2010-04-22 VITALS — BP 139/83 | HR 80 | Temp 98.4°F | Ht 59.0 in | Wt 126.0 lb

## 2010-04-22 DIAGNOSIS — Z0000 Encounter for general adult medical examination without abnormal findings: Secondary | ICD-10-CM

## 2010-04-22 NOTE — Progress Notes (Signed)
Patient here for annual wellness visit, patient reports: Risk Factors/Conditions needing evaluation or treatment: Patient expressed concern about her last cholesterol test results.   Home Safety: Patient reported having smoke detectors and does not have adaptive equipment in bathroom.  Other Information: Corrective lens: Patient wears corrective lens daily and visits eye doctor annually.  Dentures: Patient has a partial and visits a dentist annually. Memory: Patient reports some memory loss.   Balance max value patientvalue  Sitting balance 1 1  Arise 2 2  Attempts to arise 2 2  Immediate standing balance 2 2  Standing balance 1 1  Nudge 2 2  Eyes closed 1 1  360 degree turn 1 1  Sitting down 2 2   Gait max value patient value  Initiation of gait 1 1  Step length-left 1 1  Step length-right 1 1  Step height-left 1 1  Step height-right 1 1  Step symmetry 1 1  Step continuity 1 1  Path 2 2  Trunk 2 2  Walking stance 1 1   Balance/Gait Score: 26/26 Patient did report some dizziness when standing with eyes close.   Mental Status Exam max value patient value  Orientation to time 5 5  Orientation to place 5 5  Registration 3 3  Attention 5 5  Recall 3 3  Language (name 2 objects) 2 2  Language-repeat 1 1  Language-follow 3 step command 3 3  Language-read and follow directions 1 1  Write a sentence    Draw a pentagon     Mental Status Exam:28/28   Annual Wellness Visit Requirements Recorded Today In  Medical, family, social history Past Medical, Family, Social History Section  Current providers Care team  Current medications Medications  Wt, BP, Ht, BMI Vital signs  Hearing assessment (welcome visit) Hearing/Vision  Tobacco, alcohol, illicit drug use History  ADL Nurse Assessment  Depression Screening Nurse Assessment  Cognitive impairment Nurse Assessment  Fall Risk Nurse Assessment  Home Safety Progress Note  End of Life Planning (welcome visit) Social  Documentation  Medicare preventative services Progress Note  Risk factors/conditions needing evaluation/treatment Progress Note  Personalized health advice Patient Instructions, goals, letter  Diet & Exercise Social Documentation  Emergency Contact Social Documentation  Seat Belts Social Documentation  Sun exposure/protection Social Documentation    Prevention Plan: Patient is up to date on recommended screenings.  Recommended Medicare Prevention Screenings Women over 61 Test For Frequency Date of Last- BOLD if needed  Breast Cancer 1-2 yrs 05/2009  Cervical Cancer 1-3 yrs 2006  Colorectal Cancer 1-10 yrs 2010  Osteoporosis once 2006  Cholesterol 5 yrs 2012  Diabetes yearly Non diabetic  HIV yearly declined  Influenza Shot yearly 10/2009  Pneumonia Shot once 10/2007  Zostavax Shot once [facility] patient pamphlet

## 2010-04-22 NOTE — Patient Instructions (Addendum)
1. Look into joining the [employer] program at [employer]. 2. Review the cholesterol information that I have included in the letter.                   Hipertrigliceridemia  Dieta para niveles altos de triglicridos en sangre (Hypertriglyceridemia, Diet for High  blood levels of Triglycerides) La mayora de las grasas que contienen los alimentos son los triglicridos. Los triglicridos en la sangre se almacenan como grasa en el cuerpo. Niveles altos de triglicridos en la sangre pueden ponerlo en un mayor riesgo de insuficiencia cardaca e ictus.  El nivel normal de triglicridos es menos del 150 mg/dl. Los niveles al lmite de ser altos estn entre 150 y 199 mg/dl. Los niveles altos estn entre 200 - 499 mg/dL, y los [address] altos son mayores que 500 mg/dL. La decisin de Pensions consultant se basa en Education officer, community de un nivel elevado de los mismos. Para personas con niveles de triglicridos altos o en el lmite, el tratamiento incluye prdida de peso y ejercicios. Se recomiendan los medicamentos en aquellas personas con niveles de triglicridos [employer]. Muchas personas que necesitan tratamiento por tener niveles altos de triglicridos tienen sndrome metablico. Esto consiste en un conjunto de trastornos que incluyen: resistencia a la insulina, presin arterial elevada, problemas de coagulacin, colesterol y triglicridos altos. PROCEDIMIENTO PARA EL ANLISIS DE LOS TRIGLICRIDOS  No deber comer nada durante las 4 horas antes del anlisis. El rango normal de triglicridos es de 10 a 250 miligramos por decilitro (mg/dl). Algunas personas podrn Gap Inc extremos (1000 o ms) pero el nivel de triglicridos es muy alto si est por encima de 150 mg/dl, dependiendo de qu otros factores de riesgo tiene para una enfermedad cardaca.   Las personas con niveles altos de triglicridos tambin podrn Warehouse manager altos niveles de colesterol. Si tiene [employer] de colesterol y de triglicridos, el  riesgo de falla cardaca es mayor que si slo tiene niveles altos de triglicridos. Los Ryerson Inc de colesterol es uno de los factores principales de riesgo de [place].  CAMBIOS EN SU DIETA Su peso puede afectar el nivel de triglicridos en la [place]. Si est un 20% o ms por encima de su peso ideal, deber Energy Transfer Partners de triglicridos mediante la prdida de [place]. La mejor forma de combatir esto es comer menos y Restaurant manager, fast food. Las grasas proporcionan ms caloras que cualquier otra comida. La mejor forma de perder peso es consumir menos grasa. Slo un 30% de las caloras totales debern provenir de grasas. Menos del 7% de las caloras totales de su dieta debern provenir de grasas saturadas. Una dieta baja en grasas y grasas saturadas es la misma que la que se realiza para Advertising account executive. Al consumir una dieta baja en grasas, perder peso, bajar sus niveles de colesterol y [place] de triglicridos.  Consumir una dieta baja en grasas, en especial grasas saturadas, tambin ayudar a su cuerpo a bajar el nivel de triglicridos. Pregunte al nutricionista como saber cunta grasa puede comer segn el nmero de caloras que el mdico le ha indicado.  El ejercicio, adems de ayudar en la prdida de peso tambin puede ayudarlo a bajar los [place] de triglicridos.   El alcohol puede aumentar los [place] de triglicridos. Podr ser necesario que deje de consumir bebidas alcohlicas.   Demasiada cantidad de carbohidratos en su dieta tambin puede aumentar su nivel de triglicridos en sangre. Algunos carbohidratos complejos son necesarios en su dieta. Entre ellos  se puede incluir pan, arroz, patatas, otros vegetales que posean almidn y cereales.   Reduzca los hidratos de carbono "simples". Esto puede incluir azcares puros, dulces, miel, y gelatina sin perder otros nutrientes. Si tiene el tipo de triglicridos altos que estn afectados por el tipo de carbohidratos en su dieta, necesitar  consumir menos azcar y menos alimentos con International aid/development worker. El profesional que lo asiste lo ayudar.   Aadir 2 a 4 gramos de aceite de pescado (AEP + ADH) tambin puede ayudar a disminuir triglicridos. Consulte con el mdico antes de aadir suplementos a su dieta.  Siga la dieta Mantenga un peso corporal adecuado. El profesional que lo asiste lo ayudar. En general, comer menos y hacer ms ejercicio lo ayudarn a Curator. Puede ser de utilidad que segn un grupo de apoyo para el control del [place]. Consulte con su especialista acerca de los grupos de [place] de control de peso en su zona.  Consuma comidas bajas en grasa. Esto tambin puede ayudar a [employer].  Estos alimentos son bajos en grasas. Coma MS alimentos de los siguientes:   Porotos y guisantes secos; lentejas.   Clara de [employer].   Requesn bajo en grasa   Pescado.   Cortes magros de carne, como [place], lomito, cuadril, Chiropodist (qutele la grasa extra).   Cereales, pastas y panes integrales.   Leche en polvo descremada sin grasa.   Yogur descremado.   Pollo sin piel.   Queso a base de [address] o semidescremada, como [place], parmesano, [place] fresco, ricota o requesn.   Estos son alimentos Counsellor. Coma MENOS alimentos de los siguientes:   Leche entera y derivados, como queso [place], [place], [place], [place] y [place].   Carnes altas en grasas, como fiambres, embutidos, salchichas de viena, bratwurst, costillas, carne en conserva, carne de cerdo picada y carne picada de vaca sin Herbalist.   Alimentos fritos.  Limite las grasas saturadas en su dieta. Sustituir las grasas insaturadas por saturadas puede ayudarlo a disminuir el nivel de triglicridos. Necesitar leer las etiquetas para saber qu productos contienen grasas saturadas.  Estos alimentos son altos en grasas saturadas. Coma MENOS alimentos de los siguientes:   [place].  Leche entera.   Piel y [place] de [place].   Aceite de palma.    [place].   [place].   Queso crema.   [place].   Margarinas y productos de repostera que contengan los aceites mencionados.   Margarinas vegetales.   Tripas de cerdo.  Grasas de carnes.   Aceite de coco.   Aceite de almendra de palma.   Grasa de cerdo.   [place]rem[place].   Nata.   [place] salado.   Blanqueadores para caf y cremas no lcteas hechas con los aceites mencionados.   Quesos de East[employer] Kodak.   Utilice grasas no saturadas (poliinsaturadas y monoinsaturadas) de forma moderada. Recuerde, aunque las grasas no saturadas son mejores que las saturadas, [place] debera consumir una dieta baja en grasas totales.  Estos alimentos son altos en grasas no saturadas:   Aceite de canola.  Aceite de girasol.   [place].   Almendras.   Manes   Pias.   Margarinas hechas con los aceites mencionados.   Aceite de Engineer, production.  Aceite de oliva.   [place]guac[place]   Castaas de caj.   [place] de man.   Semillas de girasol.   Aceite de soja  Aceite de man.   Aceitunas   Pacanes.   Trixie Deis de castilla.   Semillas de calabaza.  Evite el azcar y los alimentos altos en azcar. Esto disminuir los carbohidratos sin disminuir otros nutrientes. El azcar en la sangre va rpidamente al cerebro. Cuando hay un exceso de azcar en la sangre, el hgado podr [place] para producir ms triglicridos. Los azcares tambin contienen caloras con otros nutrientes importantes.  Coma MENOS alimentos de los siguientes:   International aid/development worker, azcar negro, azcar impalpable, jaleas, gelatinas, mermeladas, miel, almbar, melaza, pasteles, dulces, tortas, galletitas, glaseados, pasteles, bebidas cola, gaseosas, refrescos y jugos de frutas y gelatina normal.   Evite el alcohol. El alcohol puede aumentar los niveles de triglicridos, incluso ms que Insurance claims handler. Adems, el alcohol es alto en caloras y bajo en nutrientes. Pida agua mineral o una gaseosa de bajas caloras en vez de una bebida alcohlica.   Sugerencias para planear y preparar alimentos  [place]erva, hornee o ase las carnes en vez de frerlas.   Quite la grasa de la carne y la piel de las aves antes de cocinarlas.   Aada especias, hierbas, jugo de limn o vinagre a los vegetales en vez de sal, salsas pesadas o salsas de carne.   Utilice una sartn antiadherente sin grasas o use sprays antiadherentes.   Refrigere guisos y caldos. Luego elimine la grasa endurecida que flota en la superficie antes de servir.   Refrigere las grasas de la carne asada y desgrsela para hacer salsas de carne sin grasa.   Coma ms pescado.   Utilice menos [place], margarina y otras grasas en panes o vegetales.   Utilice leche en polvo descremada o reconstituida para cocinar.   Cocine con quesos bajos en grasas.   Sustituya el yogur bajo en grasa o queso cottage en toda o parte de las cremas de las recetas de salsas, bocaditos o ensaladas espesas.   Use mitad yogur y [employer].   Sustituya la crema por leche evaporada descremada. La crema batida puede sustituirse por leche evaporada descremada o reconstituida sin grasas en algunas recetas.   Elija frutas frescas para el postre en vez de alimentos altos en grasas como pasteles o tortas. Las frutas son naturalmente bajas en grasas.  Al comer afuera  Pida aperitivos bajos en grasa como jugos de frutas y vegetales, pasta con vegetales o salsa de [place].   Seleccione sopas normales en vez de sopas crema.   Pida que las salsas y condimentos se le sirvan a un costado. Use menos cantidad de ellos.   Pida comidas horneadas, hervidas, a fuego lento, al vapor, fritas en poco aceite o asadas.   Pida margarina en vez de [place], y [place] pequea cantidad.   Beba agua mineral, t o caf sin azcar o gaseosas dietticas en vez de alcohol o bebidas dulces.  PREGUNTAS Y RESPUESTAS ACERCA DE OTRAS GRASAS EN LA SANGRE:  GRASAS SATURADAS, GRASAS TRANS Y COLESTEROL Qu son las  grasas trans? Son un tipo de grasa que se forma cuando los aceites vegetales se endurecen a Glass blower/designer de un proceso llamado hidrogenacin. Este proceso ayuda a hacer los alimentos ms slidos, darles forma y Economist su conservacin. Las grasas trans tambin se denominan aceites hidrogenados o parcialmente hidrogenados.  Qu tienen que ver las grasas saturadas, las grasas trans y Print production planner en los alimentos con las enfermedades cardacas? Las grasas saturadas, las grasas trans y Print production planner en la dieta elevan los niveles de colesterol LDL "malo" en la [place]. Cuanto ms elevado sea el nivel de colesterol LDL, ms elevado ser el riesgo de sufrir enfermedad  cardaca. Las grasas saturadas y las grasas trans elevan el colesterol de [place] similar.  Qu alimentos contienen grasas saturadas, grasas trans y colesterol? Las [employer] Communications cantidades de grasas saturadas se encuentran en productos animales, como cortes grasos de carne, piel de ave y productos lcteos enteros como [place], [place], crema y [place], y en aceites vegetales tropicales como el de [place], de almendra de palma y de [place]. Las grasas trans se encuentran a veces en los mismos alimentos como grasas saturadas, como en la [place] vegetal, Environmental health practitioner (en especial las que son duras o en barra), galletas saladas, galletitas, productos de panadera, comidas fritas, aderezo para ensaladas y otros alimentos procesados hechos con aceites vegetales parcialmente hidrogenados. Tambin existen naturalmente pequeas cantidades de grasas trans en algunos productos animales, como lcteos, carne de vaca y cordero. Los United Auto en colesterol incluyen el hgado, otras vsceras, yema de [place], [place], y productos lcteos enteros. Cmo puedo [employer] las nuevas etiquetas de los alimentos para hacer elecciones saludables? Observe la pirmide nutricional en la etiqueta del producto. Elija alimentos bajos en grasas saturadas, grasas trans y colesterol.  Para grasas saturadas y colesterol, tambin podr Chemical engineer el [place] de Valor Diario (%VD): 5% VD o menos es bajo, y 20% VD o ms es alto. (No hay %VD para las grasas trans.) Utilice el panel de la pirmide nutricional para elegir alimentos bajos en grasas saturadas y colesterol, y si las grasas saturadas no aparecen, lea los ingredientes y [place] productos que contengan aderezos de aceites hidrogenados o parcialmente hidrogenados, que tienden a ser ms altos en grasas trans. PUNTOS A RECORDAR: NECESITAR UN PEQUEO CVT (CAMBIO DE VIDA TERAPUTICO)  Converse con el profesional acerca de su enfermedad cardaca y los pasos que debe tomar para reducir factores de riesgo.   Cambie su dieta. Elija alimentos bajos en grasas saturadas, grasas trans y colesterol.   Aada ejercicio a su rutina diaria si an no lo realiza. Participe en actividad fsica moderada, como una caminata enrgica por al menos 30 minutos preferiblemente [employer]. Falta de tiempo? Reparta los 30 minutos en 3 segmentos de 10 minutos durante el da.   Deje de fumar. Si fuma, contctese con el mdico para conversar sobre cmo puede ayudarlo.   No consuma drogas.   Mantenga un peso corporal adecuado.   Mantenga una presin [place].   Contine con el control de grasas en la sangre segn le haya indicado el mdico.  Document Released: 07/06/2009  [facility] Patient Information 2011 [place], [place].

## 2010-05-12 ENCOUNTER — Encounter: Payer: Self-pay | Admitting: Home Health Services

## 2010-05-12 NOTE — Progress Notes (Signed)
I have reviewed this visit and discussed with Suzanne Lineberry and agree with her documentation  

## 2010-06-17 NOTE — Op Note (Signed)
NAME:  Anne Warren, Anne Warren                           ACCOUNT NO.:  [id number]   MEDICAL RECORD NO.:  [id number]                   PATIENT TYPE:  INP   LOCATION:  0102                                 FACILITY:  [facility]   PHYSICIAN:  Burnard Bunting, M.D.                 DATE OF BIRTH:  [birth year]   DATE OF PROCEDURE:  04/13/2002  DATE OF DISCHARGE:                                 OPERATIVE REPORT   PREOPERATIVE DIAGNOSIS:  Left trimalleolar ankle fracture.   POSTOPERATIVE DIAGNOSIS:  Left trimalleolar ankle fracture.   PROCEDURE:  Left trimalleolar ankle fracture open reduction and internal  fixation.   SURGEON:  Burnard Bunting, M.D.   ANESTHESIA:  General endotracheal.   ESTIMATED BLOOD LOSS:  25 mL.   DRAINS:  None.   TOURNIQUET TIME:  Ankle Esmarch utilized for 55 minutes.   DESCRIPTION OF PROCEDURE:  The patient was brought to the operating room,  where general endotracheal anesthesia was induced, preoperative IV  antibiotics were administered, and the left foot was prepped with Duraprep  solution and draped in a sterile manner.  Collier Flowers was used to cover the  operative field.  Ankle Esmarch was utilized.  Lateral incision was made 9  cm above the tip of the lateral malleolus down distally.  Skin and  subcutaneous tissue were sharply divided.  Care was taken to avoid injury to  the superficial peroneal nerve, the fracture site was identified, periosteal  elevation was utilized to elevate the periosteum anteriorly and posteriorly.  The fracture was reduced under direct visualization.  Anatomic reduction was  achieved.  A 3.5 mm lag screw was then placed from proximal anterior to  distal posterior.  Good compression was achieved.  A six-hole locking plate  was then applied to the somewhat osteopenic bone.  Fracture reduction was  confirmed in the AP and lateral planes under fluoroscopy.  Attention was  then directed toward the medial side.  With the lateral malleolus reduced,  good  reduction of the medial malleolus was noted.  A lateral incision was  made.  Skin and subcutaneous tissue were sharply divided. Crossing branch of  the saphenous vein was coagulated.  The fracture was then reduced under  direct visualization.  Periosteum was removed from the fracture site.  Two  50 mm 4.0 cannulated screws were placed.  Good, stable reduction was  achieved.  The reduction was confirmed in the AP and lateral planes under  fluoroscopy.  The syndesmosis was checked and was found to be stable with  Cotton test.  At this time both incisions were thoroughly irrigated, the  tourniquet was released.  Bleeding points encountered were controlled using  bipolar electrocautery.  Skin was closed using interrupted, inverted 3-0  nylon to reapproximate the subdermal tissue, then 3-0 Vicryl to  reapproximate the subdermal layer, then 3-0 nylon simple sutures were used  to reapproximate the skin  edges.  Xeroform and a posterior splint with the  ankle at neutral dorsiflexion was placed.  The patient was extubated and  transferred to the recovery room in stable condition.                                              Burnard Bunting, M.D.   GSD/MEDQ  D:  04/13/2002  T:  04/14/2002  Job:  [id number]

## 2010-06-17 NOTE — H&P (Signed)
   NAME:  Anne, Warren                           ACCOUNT NO.:  [id number]   MEDICAL RECORD NO.:  [id number]                   PATIENT TYPE:  EMS   LOCATION:  ED                                   FACILITY:  [facility]   PHYSICIAN:  Burnard Bunting, M.D.                 DATE OF BIRTH:  [name] 11, 1940   DATE OF ADMISSION:  04/13/2002  DATE OF DISCHARGE:                                HISTORY & PHYSICAL   CHIEF COMPLAINT:  Left ankle pain.   HISTORY OF PRESENT ILLNESS:  Anne Warren is a 70 year old female who slipped  on the [place] today and report left ankle pain. She denies any other  orthopedic complaints. Denies any upper extremity pain or loss of  consciousness.   CURRENT MEDICATIONS:  Lipitor.   ALLERGIES:  No known drug allergies.   PAST MEDICAL HISTORY:  Notable for hypercholesterolemia.   PAST SURGICAL HISTORY:  Cholecystectomy, remotely.   PHYSICAL EXAMINATION:  GENERAL: Alert and oriented times three.  CHEST: Clear to auscultation.  HEART: Regular rate and rhythm.  ABDOMEN: Benign.  EXTREMITIES: Extremity examination demonstrates full range of motion of the  neck and upper extremities with good motor and sensory function. Lower  extremities demonstrate no groin pain with internal and external rotation of  the leg. She has swelling in the ankle. She has palpable DP pulses.  Sensation is intact on the dorsal and plantar aspect of the foot. She has  swelling but intact skin in the foot region. There is no knee effusion or  instability.   DIAGNOSTIC IMPRESSION:  X-ray's demonstrate trimalleolar ankle fracture. EKG  is pending.   LABORATORY DATA:  Pending.   IMPRESSION:  Left ankle fracture.   PLAN:  Open reduction internal fixation. Risks and benefits are discussed.  Primary risks include infection, non-union, malunion and the need for  hardware removal, nerve and vessel damage, deep vein thrombosis, amputation,  and death. The patient understands the risks and  benefits and will proceed.                                               Burnard Bunting, M.D.    GSD/MEDQ  D:  04/13/2002  T:  04/13/2002  Job:  [id number]

## 2010-07-11 ENCOUNTER — Other Ambulatory Visit: Payer: Self-pay | Admitting: Family Medicine

## 2010-07-11 DIAGNOSIS — Z1231 Encounter for screening mammogram for malignant neoplasm of breast: Secondary | ICD-10-CM

## 2010-07-14 ENCOUNTER — Ambulatory Visit
Admission: RE | Admit: 2010-07-14 | Discharge: 2010-07-14 | Disposition: A | Discharge status: Home / Self Care | Payer: Medicare Other | Source: Ambulatory Visit | Attending: Family Medicine | Admitting: Family Medicine

## 2010-07-14 DIAGNOSIS — Z1231 Encounter for screening mammogram for malignant neoplasm of breast: Secondary | ICD-10-CM

## 2010-12-30 ENCOUNTER — Ambulatory Visit (INDEPENDENT_AMBULATORY_CARE_PROVIDER_SITE_OTHER): Payer: Medicare Other | Admitting: Family Medicine

## 2010-12-30 ENCOUNTER — Encounter: Payer: Self-pay | Admitting: Family Medicine

## 2010-12-30 DIAGNOSIS — M25519 Pain in unspecified shoulder: Secondary | ICD-10-CM | POA: Insufficient documentation

## 2010-12-30 DIAGNOSIS — R42 Dizziness and giddiness: Secondary | ICD-10-CM

## 2010-12-30 DIAGNOSIS — S46919A Strain of unspecified muscle, fascia and tendon at shoulder and upper arm level, unspecified arm, initial encounter: Secondary | ICD-10-CM

## 2010-12-30 DIAGNOSIS — IMO0002 Reserved for concepts with insufficient information to code with codable children: Secondary | ICD-10-CM

## 2010-12-30 DIAGNOSIS — R599 Enlarged lymph nodes, unspecified: Secondary | ICD-10-CM

## 2010-12-30 DIAGNOSIS — R030 Elevated blood-pressure reading, without diagnosis of hypertension: Secondary | ICD-10-CM

## 2010-12-30 NOTE — Assessment & Plan Note (Addendum)
No red flags on today's exam. Seems to be orthostatic in nature in that it usually accompanies change of position. Patient is to monitor symptoms so that she can describe these episodes better to her PCP. For now patient to improve by mouth liquid intake since she currently drinks only one glass per day. Insufficient by mouth fluid intake is a likely cause of symptoms.  Pt to return to discuss in more detail with primary Dr.

## 2010-12-30 NOTE — Progress Notes (Signed)
  Subjective:    Patient ID: Anne Warren, female    DOB: 10-11-1940, 70 y.o.   MRN: [id number]  HPI Left shoulder pain/neck pain: Off and on x2 weeks. Worse when turning head to the left. Positive tenderness on palpation over the left shoulder area. Some tenderness up into back of neck. Patient denies trauma neck. No numbness in arms or legs. No fever.  Enlarged lymph node: Located behind left ear. Has noticed x2 weeks. Had small pimple became up on left scalp area. This area is now healing.  Dizziness: Patient states that she has dizziness off and on for the past 2 weeks. Usually is when she's changing direction or when she gets up quickly. Denies symptoms of spinning. Denies feeling off balance. States that she drinks very little water-approximately 1 last per day. Does drink some juice. Os reports headaches off and on as a chronic problem.   Review of Systems As per above.    Objective:   Physical Exam  Constitutional: She is oriented to person, place, and time. She appears well-developed and well-nourished.  HENT:  Head: Normocephalic.       Small healing 2 mm scab with surrounding erythema present left upper scalp area.  Approximately 2-3 mm small lymph node palpated behind the left ear. No tenderness on palpation.   Cardiovascular: Normal rate.   Pulmonary/Chest: Effort normal and breath sounds normal. No respiratory distress.  Neurological: She is alert and oriented to person, place, and time. She exhibits normal muscle tone. Coordination normal.       Normal gait, Dix-Hallpike maneuver did not reproduce symptoms. No symptoms of dizziness during exam. No nystagmus.  Psychiatric: She has a normal mood and affect. Her behavior is normal.          Assessment & Plan:

## 2010-12-30 NOTE — Assessment & Plan Note (Signed)
Elevated at today's appointment was systolic of 160. Will followup on this at the patient's next appointment.

## 2010-12-30 NOTE — Assessment & Plan Note (Signed)
Small lymph node palpable behind left ear, most likely this is secondary to healing pustule that was on left scalp area. Will follow lymph node. Most likely will resolve the next 2-3 weeks. If it does not resolve or if it gets worse patient is to return to PCP.

## 2010-12-30 NOTE — Assessment & Plan Note (Signed)
Muscle strain is most likely cause the discomfort in left shoulder neck area. Patient to do home shoulder and neck exercises. Patient is take Motrin as needed. Return if no improvement in the next 2-3 weeks or if new or worsening symptoms.

## 2011-04-03 ENCOUNTER — Ambulatory Visit (INDEPENDENT_AMBULATORY_CARE_PROVIDER_SITE_OTHER): Payer: Medicare Other | Admitting: Family Medicine

## 2011-04-03 ENCOUNTER — Encounter: Payer: Self-pay | Admitting: Family Medicine

## 2011-04-03 VITALS — BP 160/90 | HR 63 | Ht 59.0 in | Wt 125.3 lb

## 2011-04-03 DIAGNOSIS — D485 Neoplasm of uncertain behavior of skin: Secondary | ICD-10-CM

## 2011-04-03 DIAGNOSIS — E7800 Pure hypercholesterolemia, unspecified: Secondary | ICD-10-CM

## 2011-04-03 DIAGNOSIS — I10 Essential (primary) hypertension: Secondary | ICD-10-CM

## 2011-04-03 DIAGNOSIS — M948X9 Other specified disorders of cartilage, unspecified sites: Secondary | ICD-10-CM

## 2011-04-03 DIAGNOSIS — G44209 Tension-type headache, unspecified, not intractable: Secondary | ICD-10-CM

## 2011-04-03 DIAGNOSIS — H6060 Unspecified chronic otitis externa, unspecified ear: Secondary | ICD-10-CM

## 2011-04-03 DIAGNOSIS — K219 Gastro-esophageal reflux disease without esophagitis: Secondary | ICD-10-CM

## 2011-04-03 DIAGNOSIS — E785 Hyperlipidemia, unspecified: Secondary | ICD-10-CM

## 2011-04-03 DIAGNOSIS — J45909 Unspecified asthma, uncomplicated: Secondary | ICD-10-CM

## 2011-04-03 DIAGNOSIS — R002 Palpitations: Secondary | ICD-10-CM

## 2011-04-03 HISTORY — DX: Unspecified chronic otitis externa, unspecified ear: H60.60

## 2011-04-03 HISTORY — DX: Pure hypercholesterolemia, unspecified: E78.00

## 2011-04-03 MED ORDER — NEOMYCIN-POLYMYXIN-HC 3.5-10000-1 OT SOLN: 3.0000 [drp] | Freq: Three times a day (TID) | OTIC | Status: AC | PRN

## 2011-04-03 MED ORDER — ALBUTEROL SULFATE HFA 108 (90 BASE) MCG/ACT IN AERS: 2.0000 | INHALATION_SPRAY | RESPIRATORY_TRACT | Status: DC | PRN

## 2011-04-03 MED ORDER — BECLOMETHASONE DIPROPIONATE 80 MCG/ACT IN AERS: 2.0000 | INHALATION_SPRAY | Freq: Two times a day (BID) | RESPIRATORY_TRACT | Status: DC

## 2011-04-03 NOTE — Assessment & Plan Note (Signed)
It has remained above goal and should be treated

## 2011-04-03 NOTE — Patient Instructions (Addendum)
Please return to see Dr Sheffield Slider in 3 weeks to review results and to have forehead lesion removed.  Return soon fasting for lab tests Schedule a pulmonary function test with Dr Nelda Marseille en ayunas para prueba de [place] y en 3 semanas para cita con Dr Sheffield Slider  We will arrange a bone mineral density test   Evite sal en la dieta, Cuando tengo los [address] de las pruebas de [place], [place] a Corporate investment banker con medicamento para Personal assistant presion.  Avoid salt. When I have lab test results I'm going to begin medicine for high blood pressure.

## 2011-04-03 NOTE — Progress Notes (Signed)
  Subjective:    Patient ID: Anne Warren, female    DOB: [birth year], 71 y.o.   MRN: [id number]  HPI She continues to intermittently have shortness of breath. She uses her beclomethasone inhaler only at nighttime. She coughs up most nights. She is out of her albuterol inhaler.   Occasional palpitations with no chest pain. She drinks one cup of coffee daily plus tea. No other caffeinated drinks  Her left ear is sore in the entrance of the canal and feels blocked at times.   She has a bump on her right anterior hairline that is slowly growing over the past year  She's never taken medicine for elevated blood pressure. She says she is already restricting salt.   Her husband brings the recommendations we had a healthcare about prevention screening. She hasn't had a bone mineral density test since 2009 when it was normal.   lReview of Systems     Objective:   Physical Exam  HENT:  Head: Normocephalic and atraumatic.  Right Ear: External ear normal.  Left Ear: External ear normal.       Few blackheads posterior to the ear canal entrance Both ear canals lacked any cerumen  Eyes: Conjunctivae are normal.  Neck: No tracheal deviation present. No thyromegaly present.  Cardiovascular: Normal rate and regular rhythm.   Pulmonary/Chest: Effort normal and breath sounds normal. She has no wheezes.  Lymphadenopathy:    She has no cervical adenopathy.  Neurological: She is alert.  Skin:       Just above the anterior hairline above the middle of the right forehead is a 3 mm verrucoid lesion that is pearly in the backside          Assessment & Plan:

## 2011-04-04 DIAGNOSIS — M948X9 Other specified disorders of cartilage, unspecified sites: Secondary | ICD-10-CM | POA: Insufficient documentation

## 2011-04-04 DIAGNOSIS — D485 Neoplasm of uncertain behavior of skin: Secondary | ICD-10-CM | POA: Insufficient documentation

## 2011-04-04 NOTE — Assessment & Plan Note (Signed)
She will use full dose inhaled steroid and rescue Albuterol with spacer. Pharmacy visit for PFT's. I need to ask her not to use the Albuterol before the visit, so they can be done before and after Albuterol.

## 2011-04-04 NOTE — Assessment & Plan Note (Signed)
Check thyroid function as possible cause

## 2011-04-04 NOTE — Assessment & Plan Note (Signed)
Consider analgesic withdrawal component.

## 2011-04-04 NOTE — Assessment & Plan Note (Signed)
Bone mineral density and vitamin D to rule out osteoporosis component

## 2011-04-04 NOTE — Assessment & Plan Note (Signed)
Mild external otitis, probably from over-cleaning ears. She is to avoid removing cerumen from the canal, use the drops when sore, and just hydrocortisone ointment externally for itching.

## 2011-04-04 NOTE — Assessment & Plan Note (Signed)
Check lipids, family The patient has a history of of heart attacks.

## 2011-04-10 ENCOUNTER — Other Ambulatory Visit: Payer: Medicare Other

## 2011-04-10 DIAGNOSIS — R002 Palpitations: Secondary | ICD-10-CM

## 2011-04-10 DIAGNOSIS — K219 Gastro-esophageal reflux disease without esophagitis: Secondary | ICD-10-CM

## 2011-04-10 DIAGNOSIS — I10 Essential (primary) hypertension: Secondary | ICD-10-CM

## 2011-04-10 DIAGNOSIS — E785 Hyperlipidemia, unspecified: Secondary | ICD-10-CM

## 2011-04-10 LAB — COMPREHENSIVE METABOLIC PANEL
ALT: 25 U/L (ref 0–35)
AST: 26 U/L (ref 0–37)
Alkaline Phosphatase: 104 U/L (ref 39–117)
BUN: 12 mg/dL (ref 6–23)
Calcium: 10.1 mg/dL (ref 8.4–10.5)
Chloride: 103 mEq/L (ref 96–112)
Creat: 0.7 mg/dL (ref 0.50–1.10)

## 2011-04-10 LAB — TSH: TSH: 1.792 u[IU]/mL (ref 0.350–4.500)

## 2011-04-10 LAB — LIPID PANEL
HDL: 47 mg/dL (ref >39)
LDL Cholesterol: 155 mg/dL — ABNORMAL HIGH (ref 0–99)
Total CHOL/HDL Ratio: 5 Ratio
VLDL: 35 mg/dL (ref 0–40)

## 2011-04-10 LAB — CBC
Platelets: 291 10*3/uL (ref 150–400)
RBC: 5.05 MIL/uL (ref 3.87–5.11)
RDW: 13 % (ref 11.5–15.5)
WBC: 6.3 10*3/uL (ref 4.0–10.5)

## 2011-04-10 NOTE — Progress Notes (Signed)
CMP,FLP,TSH AND CBC DONE TODAY Anne Warren 

## 2011-04-11 ENCOUNTER — Encounter: Payer: Self-pay | Admitting: Family Medicine

## 2011-04-21 ENCOUNTER — Encounter: Payer: Self-pay | Admitting: Home Health Services

## 2011-04-21 ENCOUNTER — Encounter: Payer: Self-pay | Admitting: Family Medicine

## 2011-04-24 ENCOUNTER — Ambulatory Visit: Payer: Medicare Other

## 2011-04-25 ENCOUNTER — Ambulatory Visit (INDEPENDENT_AMBULATORY_CARE_PROVIDER_SITE_OTHER): Payer: Medicare Other | Admitting: Pharmacist

## 2011-04-25 ENCOUNTER — Encounter: Payer: Self-pay | Admitting: Pharmacist

## 2011-04-25 VITALS — BP 162/100 | HR 82 | Ht 58.5 in | Wt 129.0 lb

## 2011-04-25 DIAGNOSIS — J45909 Unspecified asthma, uncomplicated: Secondary | ICD-10-CM

## 2011-04-25 NOTE — Progress Notes (Signed)
Patient ID: Anne Warren, female   DOB: [birth year], 71 y.o.   MRN: [id number]  S: Patient arrives in good spirits, accompanied by her husband. Reports using her albuterol inhaler approximately 2x per day. Last used her albuterol inhaler around 9:30 this am.   O: See Documentation Flowsheet (discrete results - PFTs) for complete Spirometry results. Patient provided good effort while attempting spirometry.   Lung Age = N/A  A/P: Patient has been experiencing SOB despite and taking QVAR 80 mcg 2 puffs BID. Normal spirometry showed no change since 11/2008.  increase to QVAR 80 mcg 3 puffs BID with spacer (provided).  Reviewed results of pulmonary function tests.  Pt verbalized understanding of results.  Written pt instructions provided.  F/U Clinic visit scheduled with Dr Sheffield Slider on 4/1.   Total time in face to face counseling 20 minutes.  Patient seen with Birdena Crandall, PharmD Candidate and [place] Smith, Pharmacy Resident. Marland Kitchen

## 2011-04-25 NOTE — Patient Instructions (Addendum)
It was a pleasure to see you today.  -Lung function test shows similar results as last test in 2010 -Use 3 puffs of QVAR inhaler twice a day using the spacer. Rinse mouth after using. -Continue to use albuterol inhaler as needed for shortness of breath.

## 2011-04-25 NOTE — Assessment & Plan Note (Addendum)
Patient has been experiencing SOB despite and taking QVAR 80 mcg 2 puffs BID. Normal spirometry showed no change since 11/2008.  increase to QVAR 80 mcg 3 puffs BID with spacer (provided).  Reviewed results of pulmonary function tests.  Pt verbalized understanding of results.  Written pt instructions provided.  F/U Clinic visit scheduled with Dr Sheffield Slider on 4/1.   Total time in face to face counseling 20 minutes.  Patient seen with Birdena Crandall, PharmD Candidate and [place] Smith, Pharmacy Resident.

## 2011-04-26 ENCOUNTER — Other Ambulatory Visit: Payer: Self-pay | Admitting: Family Medicine

## 2011-04-26 DIAGNOSIS — Z1231 Encounter for screening mammogram for malignant neoplasm of breast: Secondary | ICD-10-CM

## 2011-05-01 ENCOUNTER — Ambulatory Visit: Payer: Medicare Other

## 2011-05-01 NOTE — Progress Notes (Signed)
  Subjective:    Patient ID: Anne Warren, female    DOB: Dec 31, 1940, 71 y.o.   MRN: [id number]  HPI Reviewed and agree with Dr. Macky Lower management.    Review of Systems     Objective:   Physical Exam        Assessment & Plan:

## 2011-05-08 ENCOUNTER — Ambulatory Visit (INDEPENDENT_AMBULATORY_CARE_PROVIDER_SITE_OTHER): Payer: Medicare Other | Admitting: Family Medicine

## 2011-05-08 VITALS — BP 149/84 | HR 70 | Ht 58.5 in | Wt 130.0 lb

## 2011-05-08 DIAGNOSIS — G44209 Tension-type headache, unspecified, not intractable: Secondary | ICD-10-CM

## 2011-05-08 DIAGNOSIS — J45909 Unspecified asthma, uncomplicated: Secondary | ICD-10-CM

## 2011-05-08 DIAGNOSIS — K219 Gastro-esophageal reflux disease without esophagitis: Secondary | ICD-10-CM

## 2011-05-08 DIAGNOSIS — D485 Neoplasm of uncertain behavior of skin: Secondary | ICD-10-CM

## 2011-05-08 DIAGNOSIS — J301 Allergic rhinitis due to pollen: Secondary | ICD-10-CM

## 2011-05-08 MED ORDER — OMEPRAZOLE 20 MG PO CPDR: 20.0000 mg | DELAYED_RELEASE_CAPSULE | Freq: Every day | ORAL | Status: DC

## 2011-05-08 NOTE — Assessment & Plan Note (Signed)
I suspect GERD is causing her asthma symptoms. CXR to rule out other causes.

## 2011-05-08 NOTE — Assessment & Plan Note (Signed)
Use Cetirizine during problem seasons.

## 2011-05-08 NOTE — Patient Instructions (Addendum)
Empiece Omeprazole 20 mg 1-2 tabletas cada dia para prevenir reflujo, agruras y asma secondario  Tome Cetirazine uno cada dia para sintomas de rinitis alergica  Voy a contactarle si hay problemas en la placa del pecho.  Regrese en un mes, antes si hay sintomas de infeccion donde le quite' el lunar  Please return to see Dr Sheffield Slider in 1 month

## 2011-05-08 NOTE — Assessment & Plan Note (Signed)
Suspect these are mainly from her neck . Encouraged use of Acetaminophen rather than Ibuprofen.

## 2011-05-08 NOTE — Assessment & Plan Note (Signed)
Treatment trial with Omeprazole

## 2011-05-08 NOTE — Progress Notes (Signed)
  Subjective:    Patient ID: Anne Warren, female    DOB: 1940/09/05, 71 y.o.   MRN: [id number]  HPIThe dyspnea persists with the increased dose of the Beclomethasone. It is worse in the evenings. She hasn't been taking anything other than an occasional antacid for reflux symptoms. Admits to some hoarseness. Only mild dysphagia. Drinks one cup of coffee in the AM. Only drinks green tea.   Takes Ibuprofen some days for occipital headache.   Pimples in left auricle hurt. Not using drops inside ear canals and no itching there.   She has sneezing and itchy eyes now and in various seasons. Her husband has otc Cetirizine, but she hasn't been taking it      Review of Systems     Objective:   Physical Exam  HENT:  Right Ear: External ear normal.  Left Ear: External ear normal.       TM's normal  No cerumen in canals Closed comedones (2) without inflammation, not expressible.  Eyes: Conjunctivae are normal. Right eye exhibits no discharge. Left eye exhibits no discharge.  Cardiovascular: Normal rate and regular rhythm.   Pulmonary/Chest: Effort normal and breath sounds normal. She has no wheezes. She has no rales.          Assessment & Plan:

## 2011-05-08 NOTE — Assessment & Plan Note (Signed)
Removed by currettage after xylocaine anethesia with epi, base was coagulated with hyfrecator.

## 2011-05-16 ENCOUNTER — Ambulatory Visit
Admission: RE | Admit: 2011-05-16 | Discharge: 2011-05-16 | Disposition: A | Discharge status: Home / Self Care | Payer: Medicare Other | Source: Ambulatory Visit | Attending: Family Medicine | Admitting: Family Medicine

## 2011-05-16 DIAGNOSIS — J45909 Unspecified asthma, uncomplicated: Secondary | ICD-10-CM

## 2011-05-22 ENCOUNTER — Encounter: Payer: Self-pay | Admitting: Family Medicine

## 2011-06-06 ENCOUNTER — Ambulatory Visit (INDEPENDENT_AMBULATORY_CARE_PROVIDER_SITE_OTHER): Payer: Medicare Other | Admitting: Family Medicine

## 2011-06-06 ENCOUNTER — Encounter: Payer: Self-pay | Admitting: Family Medicine

## 2011-06-06 VITALS — BP 149/90 | HR 73 | Ht 58.5 in | Wt 122.0 lb

## 2011-06-06 DIAGNOSIS — K219 Gastro-esophageal reflux disease without esophagitis: Secondary | ICD-10-CM

## 2011-06-06 DIAGNOSIS — I10 Essential (primary) hypertension: Secondary | ICD-10-CM

## 2011-06-06 DIAGNOSIS — J45909 Unspecified asthma, uncomplicated: Secondary | ICD-10-CM

## 2011-06-06 DIAGNOSIS — L709 Acne, unspecified: Secondary | ICD-10-CM | POA: Insufficient documentation

## 2011-06-06 DIAGNOSIS — L708 Other acne: Secondary | ICD-10-CM

## 2011-06-06 DIAGNOSIS — D485 Neoplasm of uncertain behavior of skin: Secondary | ICD-10-CM

## 2011-06-06 MED ORDER — TRETINOIN 0.01 % EX GEL: Freq: Every day | CUTANEOUS | Status: AC

## 2011-06-06 NOTE — Assessment & Plan Note (Signed)
Versus sebaceous hyperplasia in some sites. Will try Tretoin gel in low dose

## 2011-06-06 NOTE — Assessment & Plan Note (Signed)
Seems to be at least partial cause of her wheezing

## 2011-06-06 NOTE — Patient Instructions (Signed)
Please return to see Dr Sheffield Slider in 3months.  Usar la Retin A menos frequente si hay demasiado irritacion de la cara.

## 2011-06-06 NOTE — Progress Notes (Signed)
  Subjective:    Patient ID: Anne Warren, female    DOB: 08/03/1940, 71 y.o.   MRN: [id number]  HPIHer cough is much improved as is her wheezing. She's taking the omeprazole just in the morning and has less heartburn. She can't use thepacer because inhaling hard through that causes her a headache. She continues both the inhaled steroid and albuterol.  She still had has some discomfort in the left external ear canal but it's improved using the drops.  She has bumps that come and go on her face which appear to have blocked pores. Her sister can squeeze out some white material, but no pus.  She brought a form from [place] health care that was completed indicating that she has had health maintenance.    Review of Systems     Objective:   Physical Exam  Constitutional: She is oriented to person, place, and time. She appears well-developed and well-nourished.  Cardiovascular: Normal rate and regular rhythm.   Pulmonary/Chest: Effort normal and breath sounds normal. She has no wheezes.  Neurological: She is alert and oriented to person, place, and time.  Skin:       Anterior scalp site of wart removal is well healed 3 mm raised lesion in glabellar area the appears to be sebaceous hyperplasia Sparse papule on face that appear to be closed comedones. Open comedones inside left ear helix          Assessment & Plan:

## 2011-06-06 NOTE — Assessment & Plan Note (Signed)
At least partially due to GERD. If she remains improved on Omeprazole, consider stopping Beclomethasone next visit

## 2011-06-07 ENCOUNTER — Telehealth: Payer: Self-pay | Admitting: *Deleted

## 2011-06-07 NOTE — Telephone Encounter (Signed)
PA required for tretinoin gel. Form placed in MD box.

## 2011-06-09 NOTE — Telephone Encounter (Signed)
The form was completed and I received a fax that the Tretoin was approved.

## 2011-06-12 ENCOUNTER — Telehealth: Payer: Self-pay | Admitting: Family Medicine

## 2011-06-12 NOTE — Assessment & Plan Note (Signed)
In reviewing her blood pressures and chronic headaches, I will call her and recommend that she take Verapamil 120 mg daily.

## 2011-06-12 NOTE — Telephone Encounter (Signed)
I phoned her and left a message that I recommend that she start a medication (Verapamil) to lower her blood pressure and try to prevent her headaches. I also informed her that I had received authorization for her to receive the Tretinoin gel from the pharmacy.

## 2011-06-12 NOTE — Telephone Encounter (Signed)
Pharmacy notified.

## 2011-06-27 ENCOUNTER — Encounter: Payer: Self-pay | Admitting: Family Medicine

## 2011-06-27 NOTE — Telephone Encounter (Signed)
error 

## 2011-07-17 ENCOUNTER — Ambulatory Visit: Payer: Medicare Other

## 2011-07-25 ENCOUNTER — Ambulatory Visit: Payer: Medicare Other

## 2011-07-28 ENCOUNTER — Ambulatory Visit
Admission: RE | Admit: 2011-07-28 | Discharge: 2011-07-28 | Disposition: A | Discharge status: Home / Self Care | Payer: Medicare Other | Source: Ambulatory Visit | Attending: Family Medicine | Admitting: Family Medicine

## 2011-07-28 DIAGNOSIS — Z1231 Encounter for screening mammogram for malignant neoplasm of breast: Secondary | ICD-10-CM

## 2011-12-13 ENCOUNTER — Ambulatory Visit (INDEPENDENT_AMBULATORY_CARE_PROVIDER_SITE_OTHER): Payer: Medicare Other | Admitting: *Deleted

## 2011-12-13 DIAGNOSIS — Z23 Encounter for immunization: Secondary | ICD-10-CM

## 2012-01-09 ENCOUNTER — Encounter: Payer: Self-pay | Admitting: Family Medicine

## 2012-01-09 ENCOUNTER — Ambulatory Visit (INDEPENDENT_AMBULATORY_CARE_PROVIDER_SITE_OTHER): Payer: Medicare Other | Admitting: Family Medicine

## 2012-01-09 VITALS — BP 144/86 | HR 78 | Temp 98.2°F | Ht 58.5 in | Wt 129.0 lb

## 2012-01-09 DIAGNOSIS — M5412 Radiculopathy, cervical region: Secondary | ICD-10-CM

## 2012-01-09 DIAGNOSIS — I10 Essential (primary) hypertension: Secondary | ICD-10-CM

## 2012-01-09 DIAGNOSIS — J45909 Unspecified asthma, uncomplicated: Secondary | ICD-10-CM

## 2012-01-09 MED ORDER — GABAPENTIN (ONCE-DAILY) 300 MG PO TABS: 1.0000 | ORAL_TABLET | Freq: Two times a day (BID) | ORAL | Status: DC | PRN

## 2012-01-09 NOTE — Patient Instructions (Addendum)
I'm sending the Gabapentin to the pharmacy to help with the neck discomfort  I will discuss your asthma with our pharmacist and send in medicine tomorrow for asthma.   We will schedule MRI of your neck and I will call you with the result  Please return to see Dr Sheffield Slider in 1 month

## 2012-01-10 MED ORDER — BECLOMETHASONE DIPROPIONATE 80 MCG/ACT IN AERS: 3.0000 | INHALATION_SPRAY | Freq: Two times a day (BID) | RESPIRATORY_TRACT | Status: DC

## 2012-01-10 NOTE — Assessment & Plan Note (Signed)
Because she is developing subjective weakness, I ordered MRI of her neck.  She is to restart the Gabapentin. Try to sleep only on back

## 2012-01-10 NOTE — Assessment & Plan Note (Signed)
It appears that she hasn't been on the Beclomethasone for some time. She isn't actively wheezing now. The pharmacists are not here today, but I wonder if a combination with a LABA would be a better choice for her to decrease confusion. I'll discuss with Dr Raymondo Band

## 2012-01-10 NOTE — Assessment & Plan Note (Addendum)
Repeat blood pressure 144/86 Start medication if still elevated next visit

## 2012-01-10 NOTE — Progress Notes (Signed)
  Subjective:    Patient ID: Anne Warren, female    DOB: 11/04/1940, 71 y.o.   MRN: [id number]  HPI Asthma - gets dyspnea on exertion including bending over to put on her shoes.   Insomnia - 1-2 times weekly. Awakens and can't go back to sleep. No problem going to sleep, but awakens at midnight without pain or other identified cause. Denies deprssion  Neck discomfort - Uses the support pillow, but does sleep on her sides at times. Gets numbness in the left middle fingers and is having problems holding things on that side.   She is happy that they will be going on a cruise together in 5 days Review of Systems     Objective:   Physical Exam  Neck: No tracheal deviation present. No thyromegaly present.       Decreased rotation and extension   Cardiovascular: Normal rate and regular rhythm.   No murmur heard. Pulmonary/Chest: Effort normal and breath sounds normal. She has no wheezes. She has no rales.  Musculoskeletal: She exhibits no edema.  Lymphadenopathy:    She has no cervical adenopathy.  Psychiatric: She has a normal mood and affect.          Assessment & Plan:

## 2012-01-16 ENCOUNTER — Ambulatory Visit: Payer: Medicare Other | Admitting: Family Medicine

## 2012-01-16 ENCOUNTER — Other Ambulatory Visit: Payer: Medicare Other

## 2012-01-25 ENCOUNTER — Ambulatory Visit
Admission: RE | Admit: 2012-01-25 | Discharge: 2012-01-25 | Disposition: A | Discharge status: Home / Self Care | Payer: Medicare Other | Source: Ambulatory Visit | Attending: Family Medicine | Admitting: Family Medicine

## 2012-01-25 DIAGNOSIS — M5412 Radiculopathy, cervical region: Secondary | ICD-10-CM

## 2012-02-01 ENCOUNTER — Encounter: Payer: Self-pay | Admitting: Family Medicine

## 2012-03-08 ENCOUNTER — Encounter: Payer: Self-pay | Admitting: Family Medicine

## 2012-03-08 ENCOUNTER — Ambulatory Visit (INDEPENDENT_AMBULATORY_CARE_PROVIDER_SITE_OTHER): Payer: Medicare Other | Admitting: Family Medicine

## 2012-03-08 VITALS — BP 130/71 | HR 81 | Temp 98.1°F | Ht 59.0 in | Wt 125.0 lb

## 2012-03-08 DIAGNOSIS — B0229 Other postherpetic nervous system involvement: Secondary | ICD-10-CM | POA: Insufficient documentation

## 2012-03-08 DIAGNOSIS — B029 Zoster without complications: Secondary | ICD-10-CM

## 2012-03-08 DIAGNOSIS — R209 Unspecified disturbances of skin sensation: Secondary | ICD-10-CM

## 2012-03-08 DIAGNOSIS — R200 Anesthesia of skin: Secondary | ICD-10-CM

## 2012-03-08 DIAGNOSIS — M5412 Radiculopathy, cervical region: Secondary | ICD-10-CM

## 2012-03-08 MED ORDER — OXYCODONE-ACETAMINOPHEN 5-325 MG PO TABS: 1.0000 | ORAL_TABLET | ORAL | Status: DC | PRN

## 2012-03-08 MED ORDER — VALACYCLOVIR HCL 500 MG PO TABS: 1000.0000 mg | ORAL_TABLET | Freq: Three times a day (TID) | ORAL | Status: DC

## 2012-03-08 MED ORDER — CAPSAICIN 0.1 % EX CREA: 1.0000 | TOPICAL_CREAM | Freq: Four times a day (QID) | CUTANEOUS | Status: DC | PRN

## 2012-03-08 NOTE — Patient Instructions (Signed)
Please pick up 3 prescriptions at your pharmacy: Acyclovir, Zostrix cream, and Percocet for pain. Follow instructions on the label. If you develop worsening pain, associated fevers, difficulty breathing, or worsening rash, please call your doctor. Schedule follow up appointment with Dr. Sheffield Slider in 2 weeks or sooner as needed.  Culebrilla (Shingles) La causa de la culebrilla es el mismo virus que provoca la varicela (el virus varicella zoster o VZV). Generalmente aparece varios aos o dcadas despus de sufrir varicela. Por eso es ms frecuente entre [employer] de 50 aos. El virus se reactiva e irrumpe como una infeccin en una raz nerviosa.  SNTOMAS  La sensacin inicial puede ser de dolor. Este dolor generalmente se describe como:  Ardor.  Lacerante.  Punzante.  Hormigueo en la raz nerviosa.  Luego de un par [employer] erupcin. La erupcin puede aparecer en cualquier zona del cuerpo y habitualmente de un solo lado (unilateral) siguiendo un patrn de banda o cinturn. Generalmente comienza como ampollas (vesculas) muy pequeas. Estas se secarn luego de 7 a 10 das. En general, no es un problema significativo, excepto por el dolor que causa.  Es ms frecuente que el dolor de larga duracin (crnico) se presente en personas de edad avanzada. Puede persistir desde [employer]. Este trastorno se denomina neuralgia postherptica. La culebrilla puede transformarse en una infeccin muy grave en algunas personas que sufren de [place], que tienen un sistema inmunolgico debilitado o en algunas formas de leucemia. Tambin puede llegar a ser grave si est tomando medicamentos por haber recibido un transplante u otros medicamentos que debilitan el sistema inmunolgico. TRATAMIENTO El profesional que lo asiste lo tratar con:  Medicacin antiviral.  Medicamentos antiinflamatorios.  Medicamentos para Primary school teacher. Es muy importante hacer reposo en cama para evitar el dolor  asociado al herpes zoster (neuralgia postherptica). Se recomienda la aplicacin de calor con una botella llena de agua caliente o una almohadilla elctrica, o aplicando presin con la mano, para Engineer, materials o las [place]. PREVENCIN Hay disponible una vacuna que ayuda a la proteccin contra la varicela zoster. La Food and Drug Administration aprob la vacuna para los individuos de 50 o ms aos de [place]. INSTRUCCIONES PARA EL CUIDADO DOMICILIARIO  Las compresas fras en la zona de la erupcin pueden ser de [place].  Solo tome medicamentos que se pueden comprar sin receta o recetados para Chief Technology Officer, Dentist o fiebre, como le indica el mdico.  Evite el contacto con:  Bebs.  Mujeres embarazadas.  Nios con eczema.  Personas mayores que han recibido un transplante.  Personas con enfermedades crnicas, como leucemia y [place].  Si la zona involucrada est en el rostro, ser derivado a un especialista para Presenter, broadcasting. Es muy importante que cumpla con las visitas para Animator. Esto le ayudar a Librarian, academic en los ojos y dolor crnico o discapacidad. SOLICITE ATENCIN MDICA DE INMEDIATO SI:  Presenta algn dolor (cefalea) en la zona del rostro o los ojos. Deber controlar este problema con el profesional que lo asiste o con un oftalmlogo. Una infeccin en una parte del ojo (crnea) puede ser muy grave. Podra conducir a la ceguera.  No obtiene alivio de los [employer].  El enrojecimiento o inflamacin se extiende.  La zona tratada se hincha o duele.  Tiene fiebre.  Nota alguna lnea roja o dolorosa que se extiende hacia fuera de la zona afectada en direccin al corazn (limpangitis).  El trastorno [place] o es diferente al  que lo trajo a Youth worker. Document Released: 10/26/2004 Document Revised: 04/10/2011 [facility] Patient Information 2013 [place], [place].

## 2012-03-08 NOTE — Progress Notes (Signed)
  Subjective:    Patient ID: Anne Warren, female    DOB: 1940/09/07, 72 y.o.   MRN: [id number]  HPI  Patient presents to clinic LT shoulder and arm pain.  Pain starts LT side of neck and radiates down to back of shoulder and arm.  Pain started 4 days ago.  Rash on right arm started yesterday.  She says rash feels "funny" and "tingling", but not itchy, painful or tender on palpation.   RT arm is fine.  She has a hx of neck pain due cervical spondylotic changes, but she says this pain is different. + Nausea, excessive sweating, and shortness of breath but these symptoms are better today.  Not diabetic.  Strong family hx of CAD, mother and father both had MI.  Patient concerned that LT shoulder and arm pain could be cardiac.  However, she denies any CP, SOB at this time.  Patient says rash is similar to when she had poison [place] last summer, however she has not been outside or exposed to any plants or shrubs recently.  Review of Systems Per HPI    Objective:   Physical Exam  Constitutional: She appears well-nourished. No distress.  Eyes: Conjunctivae normal and EOM are normal.  Neck:       Full ROM of neck, but slowly due to chronic neck pain  Cardiovascular: Normal rate and normal heart sounds.   Pulmonary/Chest: Effort normal and breath sounds normal. She has no wheezes. She has no rales.  Musculoskeletal:       Able to abduct both arms fully  Neurological: She is alert. No cranial nerve deficit.  Skin:       LT upper extremity rash is red, macular with vesicles and dermatomal distribution; non tender on palpation      Assessment & Plan:

## 2012-03-08 NOTE — Assessment & Plan Note (Signed)
Rash appeared a few days after pain presented.  Likely secondary to shingles.  Discussed with Dr. Mauricio Po who spoke to patient in Spanish about herpes zoster and treatment.  Will treat with Valacyclovir, Percocet PRN pain, and Capsaicin cream.  Handout given in Spanish.  Discussed transmission of disease.  Follow up in 2 weeks or sooner as needed if symptoms worsen.

## 2012-03-08 NOTE — Addendum Note (Signed)
Addended by: Farrell Ours on: 03/08/2012 04:47 PM   Modules accepted: Orders

## 2012-03-17 ENCOUNTER — Emergency Department (HOSPITAL_COMMUNITY)
Admission: EM | Admit: 2012-03-17 | Discharge: 2012-03-17 | Disposition: A | Discharge status: Home / Self Care | Payer: Medicare Other | Source: Home / Self Care

## 2012-03-17 ENCOUNTER — Encounter (HOSPITAL_COMMUNITY): Payer: Self-pay | Admitting: *Deleted

## 2012-03-17 DIAGNOSIS — B029 Zoster without complications: Secondary | ICD-10-CM

## 2012-03-17 MED ORDER — HYDROCODONE-ACETAMINOPHEN 10-325 MG PO TABS: 0.5000 | ORAL_TABLET | Freq: Four times a day (QID) | ORAL | Status: DC | PRN

## 2012-03-17 MED ORDER — TRAMADOL HCL 50 MG PO TABS: 50.0000 mg | ORAL_TABLET | Freq: Four times a day (QID) | ORAL | Status: DC | PRN

## 2012-03-17 NOTE — ED Provider Notes (Signed)
Medical screening examination/treatment/procedure(s) were performed by non-physician practitioner and as supervising physician I was immediately available for consultation/collaboration.  Leslee Home, M.D.  Reuben Likes, MD 03/17/12 [id number]

## 2012-03-17 NOTE — Discharge Instructions (Signed)
Thank you for coming in today. You have shingles.  It will hurt for weeks.  See Dr. Sheffield Slider or another doctor at the family practice center next week.  Take tramadol for pain as needed. Take 1/2-1 tablet of the hydrocodone 10 as needed for really bad pain.  Come back or go to the emergency room if you notice new weakness new numbness problems walking or bowel or bladder problems.

## 2012-03-17 NOTE — ED Notes (Signed)
Patient complains of left back, axillary, and arm pain; rash along left back and arm.

## 2012-03-17 NOTE — ED Provider Notes (Signed)
Anne Warren is a 72 y.o. female who presents to Urgent Care today for left arm pain.  Patient has pain radiating from her left shoulder blade to her forearm starting about 2 weeks ago. Additionally she notes a initially vesicular rash.  She notes the pain is quite significant and burning in nature. She was seen by her primary care provider's work in office and diagnosed with shingles. She was treated with oxycodone, and valacyclovir.  She notes this is not very effective in the pain medication makes her sleepy.  She denies any neck pain weakness or numbness. She denies any chest pains palpitations or shortness of breath.    PMH reviewed. Significant for hypertension History  Substance Use Topics  . Smoking status: Former Smoker -- 0.10 packs/day for 1 years    Types: Cigarettes  . Smokeless tobacco: Never Used  . Alcohol Use: No   ROS as above Medications reviewed. No current facility-administered medications for this encounter.   Current Outpatient Prescriptions  Medication Sig Dispense Refill  . albuterol (PROAIR HFA) 108 (90 BASE) MCG/ACT inhaler Inhale 2 puffs into the lungs every 4 (four) hours as needed.  2 Inhaler  prn  . beclomethasone (QVAR) 80 MCG/ACT inhaler Inhale 3 puffs into the lungs 2 (two) times daily.  1 Inhaler  11  . Capsaicin 0.1 % CREA Apply 1 applicator topically 4 (four) times daily as needed.  43 g  0  . cetirizine (ZYRTEC) 10 MG tablet Take 10 mg by mouth daily.        . Gabapentin, PHN, 300 MG TABS Take 1 tablet by mouth 2 (two) times daily as needed.  60 tablet  3  . HYDROcodone-acetaminophen (NORCO) 10-325 MG per tablet Take 0.5-1 tablets by mouth every 6 (six) hours as needed for pain.  30 tablet  0  . ibuprofen (ADVIL,MOTRIN) 200 MG tablet Take 400 mg by mouth as needed. For headaches       . neomycin-polymyxin-hydrocortisone (CORTISPORIN) otic solution Place 2 drops into both ears Twice daily as needed. For itching      . omeprazole (PRILOSEC) 20 MG capsule  Take 1 capsule (20 mg total) by mouth daily.  30 capsule  11  . oxyCODONE-acetaminophen (PERCOCET/ROXICET) 5-325 MG per tablet Take 1 tablet by mouth every 4 (four) hours as needed for pain.  30 tablet  0  . traMADol (ULTRAM) 50 MG tablet Take 1 tablet (50 mg total) by mouth every 6 (six) hours as needed for pain.  30 tablet  0  . tretinoin (RETIN-A) 0.01 % gel Apply topically at bedtime.  45 g  11  . valACYclovir (VALTREX) 500 MG tablet Take 2 tablets (1,000 mg total) by mouth 3 (three) times daily.  21 tablet  0    Exam:  BP 157/95  Pulse 80  Temp(Src) 97.8 F (36.6 C) (Oral)  Resp 18  SpO2 95% Gen: Well NAD LEFT ARM: Erythematous macular rash in a dermatomal fashion. No vesicles currently.  Normal sensation pulses capillary refill.  Normal arm and shoulder motion and strength.  Neck: Nontender spinal midline normal neck range of motion negative Spurling's test.  No results found for this or any previous visit (from the past 24 hour(s)). No results found.  Assessment and Plan: 72 y.o. female with shingles left arm.  Pain poorly controlled with oxycodone. Will use tramadol during the day and 10 mg Norco at night.  Followup with primary care provider next week.  Advised that shingles can take a  long time to feel better. Patient expresses understanding and agreement.    Rodolph Bong, MD 03/17/12 [phone]

## 2012-03-26 ENCOUNTER — Ambulatory Visit (INDEPENDENT_AMBULATORY_CARE_PROVIDER_SITE_OTHER): Payer: Medicare Other | Admitting: Family Medicine

## 2012-03-26 ENCOUNTER — Encounter: Payer: Self-pay | Admitting: Family Medicine

## 2012-03-26 ENCOUNTER — Ambulatory Visit (HOSPITAL_COMMUNITY)
Admission: RE | Admit: 2012-03-26 | Discharge: 2012-03-26 | Disposition: A | Discharge status: Home / Self Care | Payer: Medicare Other | Source: Ambulatory Visit | Attending: Family Medicine | Admitting: Family Medicine

## 2012-03-26 VITALS — BP 138/78 | HR 71 | Temp 98.2°F | Ht 58.5 in | Wt 126.9 lb

## 2012-03-26 DIAGNOSIS — R079 Chest pain, unspecified: Secondary | ICD-10-CM | POA: Insufficient documentation

## 2012-03-26 DIAGNOSIS — B029 Zoster without complications: Secondary | ICD-10-CM

## 2012-03-26 DIAGNOSIS — M5412 Radiculopathy, cervical region: Secondary | ICD-10-CM

## 2012-03-26 DIAGNOSIS — I10 Essential (primary) hypertension: Secondary | ICD-10-CM

## 2012-03-26 MED ORDER — GABAPENTIN 600 MG PO TABS: 300.0000 mg | ORAL_TABLET | Freq: Three times a day (TID) | ORAL | Status: DC

## 2012-03-26 NOTE — Assessment & Plan Note (Signed)
Radiculopathy and myelopathy on cervical MRI, but no bowel or bladder changes.  Hopefully Gabapentin in larger doses will help

## 2012-03-26 NOTE — Assessment & Plan Note (Signed)
Severe.Will increase Gabapentin dose. Recommended Zostrix also. Can use the Oxycodone and Hydrocodone that she already has.

## 2012-03-26 NOTE — Assessment & Plan Note (Signed)
Elevated, likely due to pain

## 2012-03-26 NOTE — Assessment & Plan Note (Signed)
Normal EKG. This seems to relate to the postherpetic neuralgia

## 2012-03-26 NOTE — Patient Instructions (Signed)
    [employer] Patient Information 2013 [employer].

## 2012-03-26 NOTE — Progress Notes (Signed)
  Subjective:    Patient ID: Anne Warren, female    DOB: [birth year], 72 y.o.   MRN: [id number]  HPI Post herpetic neuralgia - left arm and chest pain - She remains in severe pain in the left arm and less so in the left pectoral area. Burning pain that doesn't change with movement. Even the oxycodone or hydrocodone doesn't help much. She finished the [employer]. Didn't get the Capsaicin because the pharmacy said it was for arthritis.   Neck pain - persists, but nothing compared to the neuralgia in the left arm.  Review of Systems     Objective:   Physical Exam  Constitutional: She appears well-developed.  Cardiovascular: Normal rate and regular rhythm.   Pulmonary/Chest: Effort normal and breath sounds normal.  Musculoskeletal: Normal range of motion.  Skin:  Healed patches of erythema in T1 distribution left arm.   Psychiatric:  Worried uncomfortable affect          Assessment & Plan:

## 2012-04-01 ENCOUNTER — Telehealth: Payer: Self-pay | Admitting: Family Medicine

## 2012-04-01 NOTE — Telephone Encounter (Signed)
Pt called to let Dr. Sheffield Slider know about her medication. Pt doing good with it .  Pt will like Dr. Sheffield Slider call her for EKG result.  Thank You  MJ

## 2012-05-24 ENCOUNTER — Other Ambulatory Visit: Payer: Medicare Other

## 2012-05-24 ENCOUNTER — Other Ambulatory Visit: Payer: Self-pay | Admitting: Family Medicine

## 2012-05-24 DIAGNOSIS — E785 Hyperlipidemia, unspecified: Secondary | ICD-10-CM

## 2012-05-24 DIAGNOSIS — I10 Essential (primary) hypertension: Secondary | ICD-10-CM

## 2012-05-24 LAB — COMPREHENSIVE METABOLIC PANEL
ALT: 20 U/L (ref 0–35)
Albumin: 4.2 g/dL (ref 3.5–5.2)
CO2: 28 mEq/L (ref 19–32)
Chloride: 106 mEq/L (ref 96–112)
Potassium: 4.2 mEq/L (ref 3.5–5.3)
Sodium: 141 mEq/L (ref 135–145)
Total Bilirubin: 0.7 mg/dL (ref 0.3–1.2)
Total Protein: 7 g/dL (ref 6.0–8.3)

## 2012-05-24 LAB — LIPID PANEL
HDL: 50 mg/dL (ref >39)
LDL Cholesterol: 148 mg/dL — ABNORMAL HIGH (ref 0–99)

## 2012-05-24 NOTE — Progress Notes (Signed)
CMP AND FLP DONE TODAY Anne Warren 

## 2012-06-04 ENCOUNTER — Ambulatory Visit (INDEPENDENT_AMBULATORY_CARE_PROVIDER_SITE_OTHER): Payer: Medicare Other | Admitting: Family Medicine

## 2012-06-04 ENCOUNTER — Encounter: Payer: Self-pay | Admitting: Family Medicine

## 2012-06-04 VITALS — BP 155/92 | HR 76 | Ht 58.5 in | Wt 126.7 lb

## 2012-06-04 DIAGNOSIS — Z6710 Type A blood, Rh positive: Secondary | ICD-10-CM | POA: Insufficient documentation

## 2012-06-04 DIAGNOSIS — M5412 Radiculopathy, cervical region: Secondary | ICD-10-CM

## 2012-06-04 DIAGNOSIS — B0229 Other postherpetic nervous system involvement: Secondary | ICD-10-CM

## 2012-06-04 DIAGNOSIS — J45909 Unspecified asthma, uncomplicated: Secondary | ICD-10-CM

## 2012-06-04 DIAGNOSIS — E785 Hyperlipidemia, unspecified: Secondary | ICD-10-CM

## 2012-06-04 DIAGNOSIS — I10 Essential (primary) hypertension: Secondary | ICD-10-CM

## 2012-06-04 NOTE — Patient Instructions (Addendum)
Trate el pache de lidocaine para dolor  Continue con medicamento QVAR para prevenir asma

## 2012-06-04 NOTE — Assessment & Plan Note (Signed)
Doing well on the current regimin

## 2012-06-04 NOTE — Assessment & Plan Note (Signed)
Not well controlled, but aggravated today

## 2012-06-04 NOTE — Assessment & Plan Note (Signed)
Not able to tolerate higher doses of Gabapentin. Recommended regular doses of Acetaminophen.

## 2012-06-04 NOTE — Assessment & Plan Note (Signed)
I reviewed her risk and recommended that she take a statin

## 2012-06-04 NOTE — Assessment & Plan Note (Signed)
Improving, but still significant pain. She agreed to try the Lidoderm patch

## 2012-06-04 NOTE — Progress Notes (Signed)
  Subjective:    Patient ID: Anne Warren, female    DOB: 1940/03/28, 72 y.o.   MRN: [id number]  HPI Herpetic neuralgia - improving, but still 5/10 at times. Burning pain.Can only take low doses of the Gabapentin due to it causing dizziness.   Cervical radiculopathy - improved, but limited neck motion, but continues poor function of the right hand.   Blood type - She's aggravated that I didn't get this done on her blood draw because it is required for her home countries visa process. Our lab doesn't do it. She says the [employer] will only do it if one is giving blood, which she can't do as a Jehova's Witness. She didn't seem to agree with my rationale that her religion prevents her from receiving blood also, so there's no need for typing her blood.   Asthma - only occasionally has to take her Albuterol MDI and is using her QVAR regularly.  Review of Systems     Objective:   Physical Exam  Cardiovascular: Normal rate and regular rhythm.   No murmur heard. Pulmonary/Chest: Effort normal and breath sounds normal. No respiratory distress. She has no wheezes.  Musculoskeletal:  Mildly decreased range of motion of neck  Neurological: She is alert.  Psychiatric:  Sour disposition          Assessment & Plan:

## 2012-07-12 ENCOUNTER — Encounter: Payer: Self-pay | Admitting: Family Medicine

## 2012-08-26 ENCOUNTER — Encounter: Payer: Self-pay | Admitting: Home Health Services

## 2012-09-17 ENCOUNTER — Ambulatory Visit (INDEPENDENT_AMBULATORY_CARE_PROVIDER_SITE_OTHER): Payer: Medicare Other | Admitting: Family Medicine

## 2012-09-17 ENCOUNTER — Encounter: Payer: Self-pay | Admitting: Family Medicine

## 2012-09-17 VITALS — BP 172/84 | HR 64 | Ht 59.0 in | Wt 123.0 lb

## 2012-09-17 DIAGNOSIS — L80 Vitiligo: Secondary | ICD-10-CM

## 2012-09-17 DIAGNOSIS — I10 Essential (primary) hypertension: Secondary | ICD-10-CM

## 2012-09-17 DIAGNOSIS — M5412 Radiculopathy, cervical region: Secondary | ICD-10-CM

## 2012-09-17 DIAGNOSIS — L819 Disorder of pigmentation, unspecified: Secondary | ICD-10-CM

## 2012-09-17 DIAGNOSIS — J45909 Unspecified asthma, uncomplicated: Secondary | ICD-10-CM

## 2012-09-17 HISTORY — DX: Vitiligo: L80

## 2012-09-17 LAB — POCT SKIN KOH: Skin KOH, POC: NEGATIVE

## 2012-09-17 MED ORDER — HYDROCHLOROTHIAZIDE 12.5 MG PO TABS: 12.5000 mg | ORAL_TABLET | Freq: Every day | ORAL | Status: DC

## 2012-09-17 MED ORDER — ALBUTEROL SULFATE HFA 108 (90 BASE) MCG/ACT IN AERS: 2.0000 | INHALATION_SPRAY | RESPIRATORY_TRACT | Status: DC | PRN

## 2012-09-17 MED ORDER — BECLOMETHASONE DIPROPIONATE 80 MCG/ACT IN AERS: 3.0000 | INHALATION_SPRAY | Freq: Two times a day (BID) | RESPIRATORY_TRACT | Status: DC

## 2012-09-17 NOTE — Assessment & Plan Note (Signed)
improved

## 2012-09-17 NOTE — Assessment & Plan Note (Signed)
Some superficial desquamation, but KOH negative. Will still try course of Lamisil

## 2012-09-17 NOTE — Assessment & Plan Note (Signed)
Stable on Beclomethasone.

## 2012-09-17 NOTE — Patient Instructions (Addendum)
Empiece de tomar HCTZ para presion arterial alta.   Favor de regresar en un mes para chequeo de su presion arterial.  Please return to see Dr Randolm Idol in 1 month   Informacin acerca de la hipertensin arterial (Hypertension Information)  Cuando el corazn late [employer] sangre a travs de las arterias. La fuerza que se origina es la presin arterial. Si la presin es anormalmente alta, usted tiene hipertensin. La hipertensin es peligrosa porque fuerza a su corazn y a sus arterias. Reducir su presin arterial a niveles ms normales puede ayudarlo a reducir el riesgo de padecer ataques cardacos, insuficiencia cardaca y apoplejas. Una forma comn de medir la presin arterial se ve de esta forma: 120/80. Usted tiene hipertensin si su presin arterial sistlica (el primer nmero) es mayor a 140 mmHg, o si su presin arterial diastlica (el segundo nmero) es mayor a 90 mmHg. Los nmeros para los cuales se considera una alta presin arterial pueden ser menores si padece ciertas enfermedades tales como:  Insuficiencia cardaca.   Ataques cardacos previos.   Diabetes.   Enfermedades renales crnicas.   Apoplejas previas.   Mltiples factores de riesgo para las enfermedades cardacas.  Para saber si tiene hipertensin, su presin sangunea debe medirse mientras est sentado y relajado. Debe medirse al [facility]. Una nica lectura de presin arterial elevada (especialmente el la Sala [employer] Emergencias) no significa que Insurance underwriter. Puede ser Neomia Dear reaccin muy normal a una situacin estresante. Es importante que vea a su mdico en un tiempo breve para realizar un nuevo control. Normalmente no se puede saber por qu su presin arterial es elevada. Puede deberse a muchos factores relacionados con el estilo de vida:  El estrs.   El consumo de cigarrillos.   La falta de actividad fsica.   Sobrepeso.   Consumo de alcohol, tabaco o drogas.   Factores hereditarios y [place].  La  mayor parte de las personas no tienen sntomas que indiquen la presencia de una presin arterial elevada hasta que sta causa daos en el cuerpo. Un tratamiento efectivo puede a menudo Engineer, manufacturing o reducir [employer]. TRATAMIENTO La hipertensin puede tratarse, en primera instancia, modificando los factores relacionados con el estilo de vida que se mencionaron anteriormente. Si esto no [employer], [address] se recomienda el uso de medicamentos. Hay muchos medicamentos para tratar la hipertensin. Se ubican en distintas categoras, y su mdico lo ayudar a elegir los [employer] que son mejores para usted basndose en normas nacionales con alta aceptacin. Los medicamentos pueden tener efectos secundarios. Debe conversar acerca de estos efectos secundarios con el profesional que lo asiste. Si su presin arterial ha sido elevada durante su estada en el hospital:  Sus medicamentos pueden haber cambiado.   Le pueden haber prescrito medicamentos nuevos.   Asegrese de comprender todas las indicaciones en el momento del alta.   Asegrese de comprender todos los cambios que existan en los medicamentos que utilizaba antes de ir al hospital.   Donaciano Eva de realizar un seguimiento con su mdico dentro del tiempo establecido (normalmente dentro de [place]) para que vuelva a Chief Operating Officer su presin y revise sus medicamentos.  Document Released: 04/25/2007 Document Revised: 09/28/2010 [facility] Patient Information 2012 [place], [place].

## 2012-09-17 NOTE — Progress Notes (Signed)
  Subjective:    Patient ID: Anne Warren, female    DOB: 1940/11/08, 72 y.o.   MRN: [id number]  Hypertension  Asthma - hasn't been requiring Albuterol. Requests refill of Beclomethasone MDI  Spots on hands and forearms lighter patches of skin have been appearing here only for the past few weeks. She denies applying steroids or other topicals  Gastroenteritis - Nausea, abdominal pain, and diarrhea beginning a couple days ago and improving on [place] tea.   Herperetic neuralgia. No longer painful  Neck pain is much improved.  Her husband is in [place] until September which she says does increase her stress  Interview conducted in Spanish  Review of Systems     Objective:   Physical Exam  Cardiovascular: Normal rate and regular rhythm.   Pulmonary/Chest: Effort normal. She has wheezes.  Sparse scattered wheezes Expiration not prolonged  Neurological: She is alert.  Skin:  Circular, conjoined patches of hypopigmentation with superficial desquamation dorsal hands and wrists.   Psychiatric: Her behavior is normal. Thought content normal.  Dysphoric affect as usual          Assessment & Plan:

## 2012-09-17 NOTE — Assessment & Plan Note (Signed)
Worsened will start low dose HCTZ

## 2012-10-22 ENCOUNTER — Ambulatory Visit (INDEPENDENT_AMBULATORY_CARE_PROVIDER_SITE_OTHER): Payer: Medicare Other | Admitting: Family Medicine

## 2012-10-22 VITALS — BP 170/78 | HR 64 | Ht 58.5 in | Wt 122.8 lb

## 2012-10-22 DIAGNOSIS — I10 Essential (primary) hypertension: Secondary | ICD-10-CM

## 2012-10-22 DIAGNOSIS — L819 Disorder of pigmentation, unspecified: Secondary | ICD-10-CM

## 2012-10-22 DIAGNOSIS — R42 Dizziness and giddiness: Secondary | ICD-10-CM

## 2012-10-22 DIAGNOSIS — Z23 Encounter for immunization: Secondary | ICD-10-CM

## 2012-10-22 HISTORY — DX: Dizziness and giddiness: R42

## 2012-10-22 LAB — CBC WITH DIFFERENTIAL/PLATELET
Basophils Absolute: 0 10*3/uL (ref 0.0–0.1)
Basophils Relative: 1 % (ref 0–1)
Hemoglobin: 14.6 g/dL (ref 12.0–15.0)
MCHC: 34.4 g/dL (ref 30.0–36.0)
Neutro Abs: 3.4 10*3/uL (ref 1.7–7.7)
Neutrophils Relative %: 62 % (ref 43–77)
Platelets: 242 10*3/uL (ref 150–400)
RDW: 13.4 % (ref 11.5–15.5)

## 2012-10-22 NOTE — Patient Instructions (Addendum)
High Blood Pressure - please call your pharmacy today to have refill of HCTZ (blood pressure medication).  Skin - referral was made to dermatology, expect to hear from our office or dermatology office in one week  Dizziness - drink plenty of fluid during the day, check lab work  Return in 2 weeks to followup on blood pressure and dizziness

## 2012-10-22 NOTE — Assessment & Plan Note (Signed)
Identified as Verruca Vulgaris. Patient would like referral to dermatology. Referral was made.

## 2012-10-22 NOTE — Assessment & Plan Note (Signed)
Patient has lightheadedness/dizziness that sounds orthostatic in nature per history. However orthostatic blood pressures were normal today. Exam negative for BPPV. Neuro exam was unremarkable. -Will check CBC to rule out anemia -Patient was encouraged to increase fluid intake -If symptoms continue to persist despite increased fluid intake and lab work within normal limits would consider imaging of her brain to rule out schwannoma given history of tinnitus.

## 2012-10-22 NOTE — Progress Notes (Signed)
  Subjective:    Patient ID: Anne Warren, female    DOB: 26-Jul-1940, 72 y.o.   MRN: [id number]  HPI 72 year old Hispanic female presents for followup of hypertension.  Hypertension-patient is currently on hydrochlorothiazide 12.5 mg daily however she is been out of this medication for one week, denies chest pain, no vision changes, no headache, occasional lightheadedness  Lightheadedness-this has been chronic for this patient for at least the past year, she describes lightheadedness when going from a seated to standing position, she does have associated tinnitus of the left ear which has previously been evaluated with a hearing evaluation, patient did previously have hearing aids however does not wear them, denies loss of consciousness, she states that the dizziness and the tinnitus do not always occur at the same time.  Hypopigmented lesions on hands, these have been persistent despite antifungal. Please see pathology report as noted in the objective section.  Review of Systems  Constitutional: Negative for fever, chills and fatigue.  HENT: Negative for congestion, rhinorrhea, neck pain and neck stiffness.   Respiratory: Negative for cough and shortness of breath.   Gastrointestinal: Negative for nausea, abdominal pain and diarrhea.  Genitourinary: Negative for dysuria.  Neurological: Positive for light-headedness. Negative for dizziness, syncope, facial asymmetry and headaches.       Objective:   Physical Exam Vitals: Reviewed, Orthostatics within normal limits General: Pleasant Hispanic female, no acute distress HEENT: Normocephalic, bilateral TMs pearly-gray without erythema or bulging, pupils are equal round and reactive to light, extraocular movements are intact, no scleral icterus, nasal septum midline, uvula midline, moist mucous members, no pharyngeal erythema or exudate noted, neck was supple, no anterior posterior cervical lymphadenopathy was appreciated Cardiac: Regular in  rhythm, S1 and S2 present, no murmurs, no heaves or thrills appreciated Respiratory: Occasional expiratory wheeze, normal effort Neuro: Cranial nerves II through XII are intact, strength was 5 out of 5 in all extremity, gait was normal, sensation to light touch was grossly intact, ectopic maneuver was performed today and was unremarkable Skin: Patient continues to have hypopigmented patches over the bilateral hands and extend up to the upper arm  Recent dermatologic pathology report identified lesions has verruca vulgaris.       Assessment & Plan:  Please see problem specific assessment and plan.

## 2012-10-22 NOTE — Assessment & Plan Note (Signed)
Not currently controlled. This is likely due to the fact the patient has been out of her medication for the past week. -Refill of HCTZ sent to pharmacy

## 2012-10-23 ENCOUNTER — Encounter: Payer: Self-pay | Admitting: Family Medicine

## 2012-10-28 ENCOUNTER — Encounter: Payer: Self-pay | Admitting: Family Medicine

## 2012-11-12 ENCOUNTER — Ambulatory Visit: Payer: Medicare Other | Admitting: Family Medicine

## 2013-01-14 ENCOUNTER — Ambulatory Visit: Payer: Medicare Other | Admitting: Home Health Services

## 2013-04-04 ENCOUNTER — Ambulatory Visit (INDEPENDENT_AMBULATORY_CARE_PROVIDER_SITE_OTHER): Payer: Medicare Other | Admitting: Family Medicine

## 2013-04-04 ENCOUNTER — Encounter: Payer: Self-pay | Admitting: Family Medicine

## 2013-04-04 VITALS — BP 133/88 | HR 87 | Ht 58.5 in | Wt 127.0 lb

## 2013-04-04 DIAGNOSIS — G44209 Tension-type headache, unspecified, not intractable: Secondary | ICD-10-CM

## 2013-04-04 DIAGNOSIS — J301 Allergic rhinitis due to pollen: Secondary | ICD-10-CM

## 2013-04-04 DIAGNOSIS — J45909 Unspecified asthma, uncomplicated: Secondary | ICD-10-CM

## 2013-04-04 DIAGNOSIS — I10 Essential (primary) hypertension: Secondary | ICD-10-CM

## 2013-04-04 MED ORDER — CETIRIZINE HCL 10 MG PO TABS: 10.0000 mg | ORAL_TABLET | Freq: Every day | ORAL | Status: DC

## 2013-04-04 NOTE — Assessment & Plan Note (Signed)
Well-controlled on current regimen -No changes to pharmacotherapy made today

## 2013-04-04 NOTE — Assessment & Plan Note (Signed)
Uncontrolled due to intermittent use of beclomethasone. -Patient counseled to use Qvar twice daily as prescribed -May use albuterol as needed for shortness of breath
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
## 2013-04-04 NOTE — Assessment & Plan Note (Signed)
Allergic rhinitis likely contributing to uncontrolled asthma -Refill of Zyrtec provided
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
## 2013-04-04 NOTE — Patient Instructions (Signed)
High Blood Pressure - toma HCTZ 12.5 cada dia  Asthma - use Qvar (Beclomethasone) twice daily, use Albuterol as needed, toma Claritin cada dia tambien (para los allergias).  Delor de cabeza - toma Ibuprofen cuando necesita  Reresar al [employer] en un mes para hablar sobre su asthma.

## 2013-04-18 ENCOUNTER — Encounter: Payer: Self-pay | Admitting: Family Medicine

## 2013-05-05 ENCOUNTER — Encounter: Payer: Self-pay | Admitting: Family Medicine

## 2013-05-05 ENCOUNTER — Ambulatory Visit (INDEPENDENT_AMBULATORY_CARE_PROVIDER_SITE_OTHER): Payer: Medicare Other | Admitting: Family Medicine

## 2013-05-05 VITALS — BP 154/95 | HR 79 | Temp 98.2°F | Ht 58.5 in | Wt 127.0 lb

## 2013-05-05 DIAGNOSIS — J301 Allergic rhinitis due to pollen: Secondary | ICD-10-CM

## 2013-05-05 DIAGNOSIS — I10 Essential (primary) hypertension: Secondary | ICD-10-CM

## 2013-05-05 DIAGNOSIS — J45901 Unspecified asthma with (acute) exacerbation: Secondary | ICD-10-CM

## 2013-05-05 MED ORDER — AZITHROMYCIN 250 MG PO TABS: ORAL_TABLET | ORAL | Status: DC

## 2013-05-05 MED ORDER — PREDNISONE 50 MG PO TABS: ORAL_TABLET | ORAL | Status: DC

## 2013-05-05 MED ORDER — FLUTICASONE PROPIONATE 50 MCG/ACT NA SUSP: 2.0000 | Freq: Every day | NASAL | Status: DC

## 2013-05-05 NOTE — Patient Instructions (Addendum)
Asthma - use Qvar Owens Shark) twice daily, use Albuterol (Red) as needed for shortness of breath or wheezing, start Prednisone (take for 5 days) and Azithromy (take for 5 days).  Runny nose/nasal congestion - start Flonase daily  Return to office in 2 weeks.

## 2013-05-05 NOTE — Assessment & Plan Note (Signed)
Patient intolerant of Zyrtec. Changed to Flonase.

## 2013-05-05 NOTE — Assessment & Plan Note (Addendum)
Blood pressure elevated today. As previously well controlled no pharmacotherapy added today. -Patient scheduled to return to office in 2 weeks for follow up of asthma, if elevated then will consider increasing dose of HCTZ

## 2013-05-05 NOTE — Progress Notes (Signed)
   Subjective:    Patient ID: Anne Warren, female    DOB: 11-13-1940, 73 y.o.   MRN: [id number]  HPI 73 y/o female presents for evaluation of worsening sob/wheezing over the past few weeks. She has a history of asthma, currently on Qvar which she has been taking multiple times per day, she has not been using Albuterol as previously told to use this "only for emergencies", she has previously been given prednisone and she would like a refill of this, recently prescribed Zyrtec for allergic rhinitis however she discontinued this as it make her dizzy, these symptoms resolved after stopping the Zyrtec, she reports no fevers or chills, has had some nasal congestion and rhinorrhea, no sore throat  Social - previous social smoker, reports smoking 1-2 cigarettes per day for approx. 5 years   Review of Systems  Constitutional: Negative for chills and fatigue.  HENT: Positive for rhinorrhea. Negative for sore throat.   Respiratory: Positive for cough and shortness of breath. Negative for chest tightness.   Cardiovascular: Negative for chest pain.  Gastrointestinal: Negative for diarrhea, constipation and abdominal distention.       Objective:   Physical Exam Vitals: reviewed Gen: pleasant Hispanic female, accompanied by sister who aides with translation HEENT: Normocephalic, bilateral TM's pearly grey without erythema or bulging, PERRL, EOMI, no scleral icterus, nasal septum midline, nasal turbinates boggy, MMM, uvula midline, no pharyngeal erythema or exudate present, neck supple, no anterior or posterior cervical adenopathy Cardiac: RRR, S1 and S2 present, no murmurs, no heaves/thrills Resp: diffuse wheezes, air entry present bilaterally, normal effort, talking in full sentences Ext: no edema      Assessment & Plan:  Please see problem specific assessment and plan.

## 2013-05-05 NOTE — Assessment & Plan Note (Signed)
Patient presents with acute exacerbation of asthma. -Start Prednisone 50 mg Daily for 5 days -Start Z-pack -Patient counseled on correct use of Albuterol and Qvar

## 2013-06-17 ENCOUNTER — Encounter: Payer: Self-pay | Admitting: Family Medicine

## 2013-06-17 ENCOUNTER — Ambulatory Visit (INDEPENDENT_AMBULATORY_CARE_PROVIDER_SITE_OTHER): Payer: Medicare Other | Admitting: Family Medicine

## 2013-06-17 VITALS — BP 152/97 | HR 77 | Temp 98.1°F | Ht 58.5 in | Wt 127.0 lb

## 2013-06-17 DIAGNOSIS — I10 Essential (primary) hypertension: Secondary | ICD-10-CM

## 2013-06-17 DIAGNOSIS — Z0000 Encounter for general adult medical examination without abnormal findings: Secondary | ICD-10-CM

## 2013-06-17 DIAGNOSIS — J45909 Unspecified asthma, uncomplicated: Secondary | ICD-10-CM

## 2013-06-17 DIAGNOSIS — J301 Allergic rhinitis due to pollen: Secondary | ICD-10-CM

## 2013-06-17 LAB — LIPID PANEL
CHOL/HDL RATIO: 5.8 ratio
Cholesterol: 236 mg/dL — ABNORMAL HIGH (ref 0–200)
HDL: 41 mg/dL (ref >39)
LDL CALC: 154 mg/dL — AB (ref 0–99)
Triglycerides: 204 mg/dL — ABNORMAL HIGH (ref <150)
VLDL: 41 mg/dL — AB (ref 0–40)

## 2013-06-17 LAB — BASIC METABOLIC PANEL
BUN: 15 mg/dL (ref 6–23)
CALCIUM: 9.5 mg/dL (ref 8.4–10.5)
CO2: 27 mEq/L (ref 19–32)
Chloride: 107 mEq/L (ref 96–112)
Creat: 0.76 mg/dL (ref 0.50–1.10)
GLUCOSE: 90 mg/dL (ref 70–99)
POTASSIUM: 3.9 meq/L (ref 3.5–5.3)
SODIUM: 143 meq/L (ref 135–145)

## 2013-06-17 MED ORDER — BECLOMETHASONE DIPROPIONATE 80 MCG/ACT IN AERS: 2.0000 | INHALATION_SPRAY | Freq: Two times a day (BID) | RESPIRATORY_TRACT | Status: DC

## 2013-06-17 NOTE — Assessment & Plan Note (Signed)
Patient continues to have intermittent allergic rhinitis. Symptoms are controlled with as needed Flonase. -Patient was counseled on the importance of taking daily

## 2013-06-17 NOTE — Patient Instructions (Addendum)
Asthma - Qvar(Brown) two inhalations twice daily, Use Flonase every day, Use Albuterol (Red) as needed  Your physical exam was normal today. Dr. Ree Kida will call you with your lab results.  Please let Dr. Ree Kida know if you would like to get the Shingles vaccine. Asma - Adultos  (Asthma, Adult)  El asma es una enfermedad que afecta los pulmones. Se caracteriza por la inflamacin y Public librarian de las vas respiratorias, as como aumento de la produccin mucosa. La causa del estrechamiento es el edema y los espasmos musculares que se producen dentro de los conductos por los que pasa el aire. Cuando esto sucede, la respiracin puede ser difcil y [place] tos, sibilancias y falta de Occupational hygienist. Aprender ms sobre su enfermedad le ayudar a Art therapist. El asma no puede curarse, pero los [employer] y los cambios en el estilo de vida lo ayudarn a Therapist, sports. Puede ser un problema menor para [employer], pero si no se controla puede conducir a un ataque potencialmente mortal. El asma puede cambiar con el [place]. Es importante trabajar con su mdico para controlar sus sntomas. CAUSAS  La causa exacta es desconocida. Se cree que la causa son factores heredados (genticosy la exposicin a Secondary school teacher. En el asma hay irritacin e hinchazn (inflamacin) de las vas respiratorias. Puede originarse debido a Set designer, infecciones virales del pulmn o irritantes presentes en el aire. Las Chief of Staff pueden causar sibilancias inmediatamente o varias horas despus de una exposicin. Los desencadenantes del asma son diferentes para cada persona. Es Landscape architect atencin y saber qu lo desencadena.  Algunos desencadenantes comunes para los ataques de asma son:   La caspa que eliminan los animales de la piel, el pelo o las plumas de [place].  Los caros del polvo que se encuentran en el polvo de la casa.  [place].  El plen de los rboles o el csped.  El moho.  El  humo del cigarrillo o del tabaco. NO DEBE PERMITIRSE QUE SE FUME EN LOS HOGARES DE PERSONAS CON ASMA. Las Acupuncturist no deben fumar y no deben Agricultural consultant de fumadores.  Sustancias contaminantes como el polvo, limpiadores hogareos, sprays para el cabello, aerosoles, vapores de [place], sustancias qumicas fuertes u olores intensos.  El aire fro o los cambios climticos. El aire fro puede causar inflamacin. El viento aumenta la cantidad de moho y polen del aire. No hay ningn clima particular que sea el mejor para un asmtico.  Emociones fuertes, como llorar o reir intensamente.  Estrs.  Ciertos medicamentos como la aspirina o los betabloqueantes.  Los sulfitos que se encuentran en medicamentos y bebidas como frutas desecadas y el vino.  Enfermedades infecciosas o inflamatorias como la gripe, el resfro o una inflamacin de las membranas nasales (rinitis).  Reflujo gastroesofgico (ERGE). El ERGE es una enfermedad del estmago en el que los cidos vuelven a la garganta (esfago).  Ejercicios o actividad extenuante. Algunos medicamentos administrados antes del ejercicio permiten que la mayora de las personas puedan participar en los deportes. SNTOMAS   Falta de aire.  Opresin o dolor en el pecho.  Dificultad para dormir debido a la tos, sibilancias o falta de aire.  Un silbido o sibilancias con la espiracin.  Tos o sibilancias que empeoran cuando usted:  Tiene un virus (como un resfriado o gripe).  Sufre de Set designer.  Est expuesto a ciertos vapores o sustancias qumicas.  La prctica de ejercicios. Los signos de que su asma est empeorando son:   [place] y sntomas  de asma ms frecuentes y molestos.  Aumenta la dificultad para respirar. Esto puede medirse con un medidor de flujo mximo, que es un dispositivo simple que se [place] para comprobar como estn funcionando los pulmones.  La necesidad cada vez ms frecuente de Chief Strategy Officer de [place]  rpido. DIAGNSTICO  El diagnstico se realiza revisando su historia clnica a travs de un examen fsico y con [employer]. Algunos estudios de la funcin pulmonar pueden ayudar con el diagnstico.  TRATAMIENTO  El asma no se cura. Sin embargo, en la [employer] de los adultos puede controlarse con [place]. Adems de evitar los desencadenantes, ser necesario que use medicamentos. Hay 2 tipos de medicamento que se utilizan en el tratamiento para el asma: los medicamentos controladores (reducen la inflamacin y los sntomas) y los medicamentos de Art gallery manager o de rescate ( alivian los sntomas durante los ataques agudos). Es posible que necesite medicamentos todos los das para controlar el asma. Los medicamentos controladores ms eficaces para el asma son los corticoides inhalados (reducen la inflamacin). Otros medicamentos de control a largo plazo incluyen:   Antagonistas de los receptores de leucotrienos (bloquea una va de inflamacin).  Beta2-agonistas de accin prolongada (relajan los msculos de las vas respiratorias durante al menos 12 horas) con un corticoides inhalado.  El cromoglicato o nedocromil sdico (modifica la capacidad de ciertas clulas inflamatorias para liberar los productos qumicos que causa inflamacin).  Los inmunomoduladores (alteran el sistema inmunolgico para prevenir los sntomas del asma).  La teofilina (relaja los msculos de las vas respiratorias). Puede ser posible que necesite beta2-agonistas de corta accin para [employer] sntomas del asma durante un ataque agudo.  Usted debe saber qu hacer durante un ataque agudo. Los inhalantes son eficaces cuando se Naval architect. Lea las instrucciones para saber Clinical research associate, y hable con su mdico si tiene dudas. Concurra a los controles regularmente para asegurarse que el asma est bien controlado. Si no est bien controlado, si ha sido hospitalizado por asma o si se necesitan  mltiples medicamentos o dosis medias a altas de corticoides inhalados para controlarlo, pida que lo deriven a un especialista en asma.  [place] todos los medicamentos segn le indic su mdico.  Controle el ambiente hogareo en los siguientes modos para prevenir los ataques de asma:  Cambie el filtro de la calefaccin y del aire acondicionado al menos una vez al mes.  Coloque un filtro o estopa sobre la ventilacin de la calefaccin o del aire acondicionado.  Limite el uso de hogares o estufas a lea.  No fume. No permanezca en lugares en los que otras personas fuman.  Elimine las plagas (cucarachas, ratones) y sus excrementos.  Si observa moho en una planta, elimnela.  Limpie los pisos y elimine el polvo una vez por semana. Utilice productos sin perfume. Utilice una aspiradora con filtros HEPA, siempre que le sea posible. Si la aspiradora o las tareas de limpieza son los desencadenantes de su asma, trate de encontrar a otra persona para hacer estas tareas.  Los pisos de su casa deben ser de [place], [place] o vinilo. Las alfombras pueden retener la caspa y [place].  Use almohadas, mantas y cubre colchones antialrgicos.  [place] sbanas y [place] todas las semanas con agua caliente, y squelas con Freight forwarder.  Use mantas de polister o algodn de tejido apretado.  No use cobertores Environmental manager.  Limpie el bao y la cocina con lavandina y  pntelos con pintura antihongos.  Lvese las manos con frecuencia.  Converse con el mdico acerca del plan de accin para controlar los ataques de asma. Incluye el uso de un medidor de flujo, que mide la gravedad del ataque, y medicamentos que ayuden a Architectural technologist ataque. Un plan de accin que lo ayude a minimizar o [employer] ataques sin necesidad de Sales executive atencin mdica.  Mantenga la calma durante el ataque.  Cuente siempre con un plan para solicitar atencin mdica. Debe incluir la forma de  contactar a su mdico y, en caso de un ataque grave, comunicarse con el servicio de Multimedia programmer de su localidad (911 en los [place]). SOLICITE ATENCIN MDICA SI:   Respira con dificultad an cuando toma la medicina para prevenir los ataques.  Elimina flema cada vez ms espesa.  Su esputo cambia de un color claro o blanco a un color amarillo, verde, gris o sanguinolento.  Tiene trastornos ocasionados por la medicina que est tomando (como urticaria, picazn, hinchazn o dificultades respiratorias).  Necesita un medicamento aliviador ms de 2 o 3 veces por semana.  Su flujo mximo an est en 50-79% del Pharmacist, hospital personal despus de seguir el plan de accin durante 1 hora. SOLICITE ATENCIN MDICA DE INMEDIATO SI:   Le falta el aire, incluso en reposo.  Le falta el aire an cuando hace muy poca actividad fsica.  Tiene dificultad para comer, beber o hablar debido a los sntomas del asma.  Siente dolor en el pecho o el corazn palpita muy rpido.  Tiene los labios o las uas de tono [place].  Usted se siente mareado, dbil o se desmaya.  Tiene fiebre o sntomas que persisten durante ms de 2 o 3 das.  Tiene fiebre y los sntomas empeoran de manera sbita.  Usted parece empeorar y no responde al tratamiento durante un ataque de asma.  Su flujo mximo es Garment/textile technologist del 50% del Pharmacist, hospital personal. ASEGRESE DE QUE:   Comprende estas instrucciones.  Controlar su enfermedad.  Solicitar ayuda de inmediato si no mejora o si empeora. Document Released: 01/16/2005 Document Revised: 01/03/2012 [facility] Patient Information 2014 [place], [place].

## 2013-06-17 NOTE — Assessment & Plan Note (Signed)
Patient presents for annual physical exam. She is up-to-date on immunizations other than Zostavax. She would like to think about this vaccine prior to having a completed. She is up-to-date on colon cancer screening. She is due for a mammogram and information was provided. She is not a candidate for cervical cancer screening at this time. Information was provided on health care power of attorney.

## 2013-06-17 NOTE — Assessment & Plan Note (Signed)
Patient has a controlled asthma. This is likely due to not understanding how to correctly take her medications. We discussed the proper use of Qvar and albuterol.

## 2013-06-17 NOTE — Addendum Note (Signed)
Addended by: Lupita Dawn on: 06/17/2013 04:45 PM   Modules accepted: Level of Service

## 2013-06-17 NOTE — Progress Notes (Signed)
Patient ID: Anne Warren, female   DOB: 12/04/1940, 73 y.o.   MRN: [id number] 73 y.o. year old female presents for well woman/preventative visit and annual GYN examination. Spanish interpreter is present.  Acute Concerns: She reports that she has continued to have uncontrolled asthma, she reports daily shortness of breath, she is requiring her albuterol inhaler at least one to 2 times daily, her symptoms are worse at night, she reports only using Flonase as needed, she's only been using her Qvar once daily, her symptoms had been improved after being treated with a Z-Pak and steroid burst after her last visit  Sexual/Birth History: Patient is postmenopausal, she is not currently active with her husband  POA/Living Will: Patient does not have a living will however she would like for her sister Peter Congo to be her health care power of attorney or her husband Building services engineer  Social:  History   Social History  . Marital Status: Married    Spouse Name: Glenard Haring    Number of Children: 1  . Years of Education: 8   Occupational History  . Retired- [employer]    Social History Main Topics  . Smoking status: Former Smoker -- 0.10 packs/day for 1 years    Types: Cigarettes  . Smokeless tobacco: Never Used  . Alcohol Use: No  . Drug Use: No  . Sexual Activity: Yes   Other Topics Concern  . None   Social History Narrative   Native of [place]   Married to [place], native of [place], visits children of his first marriage there.   They have a daughter, who is a Chief Executive Officer in [place] area   Religion, [place]: Primary: sister Jannett Celestine [phone] Secondary: Broadus John [phone]   Emergency Contact: Husband Claudie Fisherman [phone]   End of Life Plan: DNR     Who lives with you: Lives in 1 story home with husband.   Any pets: Dog, Cosce   Diet: Patient has a varied diet of protein, starch, and vegetables.  Patient tries not to eat red meat.   Exercise: Patient  does not have an exercise routine.  In past she has had problems with her asthma while exercising.   Seatbelts: Patient reports wearing seat belts when in vehicle.    Nancy Fetter Exposure/Protection: Patient reports wearing sun protection.   Hobbies: Likes to bake, especially cheese cake and apple pies.              Immunization:  Tdap/TD: 2013  Influenza: 2014  Pneumococcal: 2013  Herpes Zoster: Never provided, patient has previously had shingles  Cancer Screening:  Pap Smear: No previous history abnormal cervical cancer screen  Mammogram: 2013  Colonoscopy: 2010  Dexa: Has not been completed  Physical Exam: VITALS: Reviewed GEN: Pleasant Hispanic female, no acute distress HEENT: Normocephalic, pupils are equal round and reactive to light, extraocular movements are intact, bilateral TMs are pearly-gray, nasal septum midline, no rhinorrhea, moist mucous members, uvula midline, no pharyngeal erythema or exudate noted, neck was supple, no anterior posterior cervical lymphadenopathy, no thyromegaly CARDIAC: Regular rate and rhythm, S1 and S2 present, no murmurs, no heaves or thrills RESP: Scattered wheezes, normal effort BREAST:Exam performed in the presence of a chaperone. No abnormal masses or lesions noted, no axillary lymphadenopathy, no nipple discharge or drainage ABD: Soft, nontender, bowel sounds present, no rebound, no guarding GU/GYN:Exam performed in the presence of a chaperone. Normal external female anatomy, no speculum exam performed, bimanual exam  was unremarkable EXT: No edema, 2+ radial pulses bilaterally SKIN: No rash, no suspicious lesions noted  ASSESSMENT & PLAN: 73 y.o. female presents for annual well woman/preventative exam and GYN exam. Please see problem specific assessment and plan.

## 2013-06-19 ENCOUNTER — Encounter: Payer: Self-pay | Admitting: Family Medicine

## 2013-06-19 ENCOUNTER — Other Ambulatory Visit: Payer: Self-pay | Admitting: Family Medicine

## 2013-06-19 DIAGNOSIS — E785 Hyperlipidemia, unspecified: Secondary | ICD-10-CM

## 2013-06-19 MED ORDER — ASPIRIN EC 81 MG PO TBEC: 81.0000 mg | DELAYED_RELEASE_TABLET | Freq: Every day | ORAL | Status: DC

## 2013-06-19 NOTE — Assessment & Plan Note (Signed)
Patient has 10 year ASCVD risk of 24.5. As patient is intolerant to Statins will start ASA 81 mg daily. Prescription sent to pharmacy.

## 2013-06-24 ENCOUNTER — Ambulatory Visit (INDEPENDENT_AMBULATORY_CARE_PROVIDER_SITE_OTHER): Payer: Medicare Other | Admitting: Home Health Services

## 2013-06-24 ENCOUNTER — Encounter: Payer: Self-pay | Admitting: Home Health Services

## 2013-06-24 VITALS — BP 140/86 | HR 75 | Temp 98.0°F | Ht 60.5 in | Wt 127.6 lb

## 2013-06-24 DIAGNOSIS — Z0000 Encounter for general adult medical examination without abnormal findings: Secondary | ICD-10-CM

## 2013-06-24 NOTE — Progress Notes (Signed)
Patient here for annual wellness visit, patient reports: Risk Factors/Conditions needing evaluation or treatment: Pt. does have risk factors for asthma that need evaluation.  Home Safety: Pt lives at home, with her husband in a 1 story home.  Pt reports having smoke alarms and not having adaptive equipment.  Other Information: Corrective lens: Pt has corrective lens, has annual eye exams. Dentures: Pt has partial dentures, has annual dental exams. Memory: Pt has memory problems.  Patient's Mini Mental Score (recorded in doc. flowsheet): 29  Bladder:  Pt denies having problems with bladder control.  BMI/Exercise: We discussed BMI and strategies for maintaining weight including eating low fat cheese, reducing serves of bread and eating more green leafy vegetables.  We also discussed  having a regular exercise routine that consists of walking 2-3 times per week.  Med Adherence:  We discussed importance of taking medications for asthma and cholesterol.  Pt reports missing 0 day in the past week.  ADL/IADL:  Pt reports independence in all functions. Balance/Gait: Pt reports 1 fall in the past year.  We discussed home safety and fall prevention.    Balance Abnormal Patient value  Sitting balance    Sit to stand    Attempts to arise    Immediate standing balance    Standing balance    Nudge    Eyes closed- Romberg    Tandem stance x Hard time balancing to complete task  Back lean    Neck Rotation    360 degree turn    Sitting down    Pt. reported having pain on her right side and she felt as though she was lending to her right when she walked. Pt movement also appeared slow.   Annual Wellness Visit Requirements Recorded Today In  Medical, family, social history Past Medical, Family, Social History Section  Current providers Care team  Current medications Medications  Wt, BP, Ht, BMI Vital signs        Tobacco, alcohol, illicit drug use History  ADL Nurse Assessment  Depression  Screening Nurse Assessment  Cognitive impairment Nurse Assessment  Mini Mental Status Document Flowsheet  Fall Risk Fall/Depression  Home Safety Progress Note  End of Life Planning (welcome visit) Social Documentation  Medicare preventative services Progress Note  Risk factors/conditions needing evaluation/treatment Progress Note  Personalized health advice Patient Instructions, goals, letter  Diet & Exercise Social Documentation  Emergency Contact Social Documentation  Seat Belts Social Documentation  Sun exposure/protection Social Documentation

## 2013-06-25 ENCOUNTER — Encounter: Payer: Self-pay | Admitting: Home Health Services

## 2013-06-25 NOTE — Progress Notes (Signed)
Patient ID: Anne Warren, female   DOB: 1940/04/22, 73 y.o.   MRN: [id number] I have reviewed this visit and discussed with Anne Warren and agree with her documentation.

## 2013-08-11 ENCOUNTER — Encounter (INDEPENDENT_AMBULATORY_CARE_PROVIDER_SITE_OTHER): Payer: Self-pay

## 2013-08-11 ENCOUNTER — Ambulatory Visit
Admission: RE | Admit: 2013-08-11 | Discharge: 2013-08-11 | Disposition: A | Discharge status: Home / Self Care | Payer: Medicare Other | Source: Ambulatory Visit

## 2013-08-11 ENCOUNTER — Other Ambulatory Visit: Payer: Self-pay

## 2013-08-11 DIAGNOSIS — Z1231 Encounter for screening mammogram for malignant neoplasm of breast: Secondary | ICD-10-CM

## 2013-08-12 ENCOUNTER — Ambulatory Visit (INDEPENDENT_AMBULATORY_CARE_PROVIDER_SITE_OTHER): Payer: Medicare Other | Admitting: Family Medicine

## 2013-08-12 ENCOUNTER — Encounter: Payer: Self-pay | Admitting: Family Medicine

## 2013-08-12 VITALS — BP 143/78 | HR 80 | Temp 98.3°F | Ht 60.5 in | Wt 125.0 lb

## 2013-08-12 DIAGNOSIS — J45901 Unspecified asthma with (acute) exacerbation: Secondary | ICD-10-CM

## 2013-08-12 DIAGNOSIS — I10 Essential (primary) hypertension: Secondary | ICD-10-CM

## 2013-08-12 DIAGNOSIS — J4521 Mild intermittent asthma with (acute) exacerbation: Secondary | ICD-10-CM

## 2013-08-12 MED ORDER — PREDNISONE 50 MG PO TABS: ORAL_TABLET | ORAL | Status: DC

## 2013-08-12 NOTE — Assessment & Plan Note (Signed)
Slightly elevated likely due to not taking HCTZ over the past 2 days. -recheck blood pressure at next visit.

## 2013-08-12 NOTE — Patient Instructions (Signed)
Asthma - start Prednisone today, take for 5 days, use your albuterol at least 3-4 times per day, continue current Qvar regimen  HTN - continue to take HCTZ cada dia  Return in one week.

## 2013-08-12 NOTE — Assessment & Plan Note (Signed)
Suspect acute exacerbation of asthma.  -5 day course of steroids prescribed -she has had multiple exacerbations this year, patient to follow up in 2 weeks, consider addition of anther agent to current asthma regimen

## 2013-08-12 NOTE — Progress Notes (Signed)
   Subjective:    Patient ID: Anne Warren, female    DOB: 18-Apr-1940, 73 y.o.   MRN: [id number]  HPI 73 y/o female presents for follow up of asthma.   Asthma - feels like breathing is worse, also having worse cough, currently on Qvar (taking 2 times BID), and Albuterol (uses only a few times per week), cough worse at night, cough causes chest pain, no chest pain at rest, non-productive cough  HTN - currently on HCTZ 12.5 mg daily (not taking daily as sometimes forgets), no vision changes, headaches only with cough.   Social - former smoker   Review of Systems Headache and chest pain with cough Having to use 3 pillows at night to sleep due to cough, no weight gain, no increase in LE swelling    Objective:   Physical Exam Vitals: reviewed Gen: pleasant Hispanic female, Interpretor present Cardiac: RRR, S1 and S2 present, no murmurs, no heaves/thrills Resp: diffuse wheezes, good air entry in all lung fields, normal work of breathing Ext: trace pedal edema     Assessment & Plan:  Please see problem specific assessment and plan.

## 2013-08-19 ENCOUNTER — Encounter: Payer: Self-pay | Admitting: Family Medicine

## 2013-08-19 ENCOUNTER — Ambulatory Visit (INDEPENDENT_AMBULATORY_CARE_PROVIDER_SITE_OTHER): Payer: Medicare Other | Admitting: Family Medicine

## 2013-08-19 VITALS — BP 110/68 | HR 69 | Temp 98.2°F | Ht 61.0 in | Wt 123.3 lb

## 2013-08-19 DIAGNOSIS — R42 Dizziness and giddiness: Secondary | ICD-10-CM

## 2013-08-19 DIAGNOSIS — J4531 Mild persistent asthma with (acute) exacerbation: Secondary | ICD-10-CM

## 2013-08-19 DIAGNOSIS — J45901 Unspecified asthma with (acute) exacerbation: Secondary | ICD-10-CM

## 2013-08-19 DIAGNOSIS — I10 Essential (primary) hypertension: Secondary | ICD-10-CM

## 2013-08-19 NOTE — Assessment & Plan Note (Signed)
Patient has had episodic lightheadedness, suspect due to orthostatic hypotension. -stopped HCTZ today

## 2013-08-19 NOTE — Patient Instructions (Signed)
Asthma - continue Qvar daily and Albuterol as needed  Dizziness - likely due to your blood pressure medication, please stop HCTZ  Follow up in one month.

## 2013-08-19 NOTE — Assessment & Plan Note (Signed)
Asthma exacerbation has resolved  -continue daily Qvar and as needed albuterol

## 2013-08-19 NOTE — Progress Notes (Signed)
   Subjective:    Patient ID: Anne Warren, female    DOB: 07-05-1940, 73 y.o.   MRN: [id number]  HPI 73 year old female presents for followup of asthma exacerbation, she was seen one week ago and started on prednisone burst, she reports significant improvement of her symptoms, she's had significant improvement in her shortness of breath, she continues to use Qvar twice daily, she is only needed to use her albuterol a couple of times over the past week  Patient also reports intermittent lightheadedness, primarily when standing from a seated position, no association with head movements, no syncope, no vision changes, no headache, no new numbness or tingling in her extremities, no leg swelling  Social-lives with her husband   Review of Systems  Constitutional: Negative for fever, chills and fatigue.  Respiratory: Negative for cough and shortness of breath.   Cardiovascular: Negative for chest pain and leg swelling.  Neurological: Positive for light-headedness.       Objective:   Physical Exam Vitals: Reviewed including orthostatic blood pressures General: Pleasant Hispanic female, interpreter present HEENT: Normocephalic, pupils are equal round and reactive to light, extraocular movements are intact, no scleral icterus, nasal septum midline, moist mucous membranes, uvula midline, no pharyngeal erythema or exudate noted, neck was supple, no anterior posterior cervical lymphadenopathy Cardiac: Regular rate and rhythm, S1 and S2 present, no murmurs, no heaves or thrills Respiratory: Clear to station bilaterally, normal effort Extremities: No edema Neuro: Alert and oriented x3, normal gait     Assessment & Plan:  Please see problem specific assessment and plan.

## 2013-08-19 NOTE — Assessment & Plan Note (Signed)
Patient slightly hypotensive with evidence of orthostatic hypotension -Stopped HCTZ today

## 2013-10-17 ENCOUNTER — Other Ambulatory Visit: Payer: Self-pay | Admitting: Family Medicine

## 2013-10-20 ENCOUNTER — Ambulatory Visit (INDEPENDENT_AMBULATORY_CARE_PROVIDER_SITE_OTHER): Payer: Medicare Other | Admitting: Family Medicine

## 2013-10-20 ENCOUNTER — Encounter: Payer: Self-pay | Admitting: Family Medicine

## 2013-10-20 VITALS — BP 158/82 | HR 76 | Temp 98.3°F | Ht 61.0 in | Wt 128.1 lb

## 2013-10-20 DIAGNOSIS — J45901 Unspecified asthma with (acute) exacerbation: Secondary | ICD-10-CM

## 2013-10-20 DIAGNOSIS — J45909 Unspecified asthma, uncomplicated: Secondary | ICD-10-CM

## 2013-10-20 DIAGNOSIS — J4531 Mild persistent asthma with (acute) exacerbation: Secondary | ICD-10-CM

## 2013-10-20 DIAGNOSIS — Z23 Encounter for immunization: Secondary | ICD-10-CM

## 2013-10-20 MED ORDER — PREDNISONE 50 MG PO TABS: ORAL_TABLET | ORAL | Status: DC

## 2013-10-20 MED ORDER — IPRATROPIUM BROMIDE 0.02 % IN SOLN
0.5000 mg | Freq: Once | RESPIRATORY_TRACT | Status: AC
  Administered 2013-10-20: 0.5 mg via RESPIRATORY_TRACT

## 2013-10-20 MED ORDER — ALBUTEROL SULFATE (2.5 MG/3ML) 0.083% IN NEBU
2.5000 mg | INHALATION_SOLUTION | Freq: Once | RESPIRATORY_TRACT | Status: AC
  Administered 2013-10-20: 2.5 mg via RESPIRATORY_TRACT

## 2013-10-20 NOTE — Patient Instructions (Signed)
Thank you so much for coming to see me today!  I have sent in a prescription of steroids to take by mouth (prednisone).  Please take one pill a day for 5 days. Dr. Ree Kida has already sent in a refill of your QVAR.  It should be waiting at the pharmacy for you! Please take 2 puffs two times a day. We will give you a nebulizer treatment before you leave the office today to help with your breathing and wheezing. Please follow up with Dr. Ree Kida at your appointment on 10/27/13 at 8:30am or sooner if your breathing worsens.  Thanks again, Dr. Gerlean Ren  Asma (Asthma) El asma es una enfermedad recurrente en la que las vas respiratorias se estrechan y [place]. Puede causar dificultad para respirar. Provoca tos, sibilancias y sensacin de falta de aire. Los episodios de asma, tambin llamados crisis de asma, pueden ser leves o potencialmente mortales. El asma no puede curarse, pero los [employer] y los cambios en el estilo de vida lo ayudarn a Aeronautical engineer enfermedad. CAUSAS Se cree que la causa del asma son factores hereditarios (genticos) y la exposicin a factores ambientales; sin embargo, su causa exacta se desconoce. El asma generalmente es desencadenada por alrgenos, infecciones en los pulmones o sustancias irritantes que se encuentran en el aire. Los desencadenantes del asma son diferentes para cada persona. Los factores desencadenantes comunes incluyen:   Caspa de los [place].  caros del polvo.  [place].  El polen de los rboles o el csped.  [place]o.  Humo.  Sustancias contaminantes como el polvo, limpiadores del hogar, sprays para el cabello, aerosoles, vapores de pintura, sustancias qumicas fuertes u olores intensos.  El [place] fro, los cambios de Scientist, forensic y el viento (que aumenta la cantidad de moho y polen en el aire).  Emociones intensas, como llorar o rer [employer].  Estrs.  Ciertos medicamentos (como la aspirina) o algunos frmacos (como los  betabloqueantes).  Los sulfitos que contienen los alimentos y las bebidas. Los alimentos y bebidas que pueden contener sulfitos son las frutas desecadas, las papas fritas y los vinos espumantes.  Enfermedades infecciosas o inflamatorias, como la gripe, el resfro o la inflamacin de las membranas nasales (rinitis).  El reflujo gastroesofgico (ERGE).  Los ejercicios o actividades extenuantes. SNTOMAS Los sntomas pueden ocurrir inmediatamente despus de que se desencadena el asma o muchas horas ms tarde. [place] sntomas son:  Sibilancias.  Tos excesiva durante la noche o temprano por la maana.  Tos frecuente o intensa durante un resfro comn.  Opresin en el pecho.  Falta de aire. DIAGNSTICO  El diagnstico se realiza mediante la revisin de su historia clnica y de un examen fsico. Es posible que le indiquen algunos estudios. Estos pueden incluir:  Estudios de la funcin pulmonar. Estas pruebas indican cunto aire inhala y exhala.  Pruebas de alergia.  Estudios de diagnstico por imgenes, como radiografas. TRATAMIENTO  El asma no puede curarse, pero puede controlarse. El Tax inspector identificar y Product/process development scientist los factores desencadenantes del asma. Tambin incluye medicamentos. Hay dos tipos de medicamentos utilizados en el tratamiento para el asma:   Medicamentos de control del asma. Impiden que aparezcan los sntomas. Generalmente se [employer].  Medicamentos de [place] o de rescate. Alivian los sntomas rpidamente. Se utilizan cuando es necesario y proporcionan alivio a Control and instrumentation engineer. El mdico lo ayudar a Animal nutritionist de accin para el asma, que es una planificacin por escrito para el control y el tratamiento de las crisis de asma. Incluye  una lista de los factores desencadenantes y el modo en que pueden evitarse. Tambin incluye informacin acerca del momento en que se deben [employer] y cundo se debe cambiar la dosis. Un plan de accin  tambin incluye el uso de un dispositivo llamado espirmetro. El espirmetro es un dispositivo que mide el funcionamiento de los pulmones. Lo ayuda a controlar la enfermedad. [place] los medicamentos solamente como se lo haya indicado el mdico. Hable con el mdico si tiene preguntas acerca de cmo o cundo tomar los medicamentos.  Use un espirmetro de acuerdo con las indicaciones del mdico. Anote y Quarry manager un registro de los [place].  Conozca y [employer] de accin para ayudar a minimizar o detener una crisis de asma sin necesidad de buscar atencin mdica.  Controle el ambiente de su hogar de la siguiente manera para prevenir las crisis de asma:  No fume. Evite la exposicin al humo de otros fumadores.  Cambie regularmente el filtro de la calefaccin y del aire acondicionado.  Limite el uso de hogares o estufas a lea.  Elimine las plagas (como cucarachas, ratones) y sus excrementos.  Elimine las plantas si observa moho en ellas.  Limpie habitualmente los pisos y el polvo. Utilice productos sin perfume.  Intente que otra persona pase la aspiradora con regularidad. Permanezca fuera de las habitaciones mientras son aspiradas y por algn tiempo despus. Si usted pasa la [place], use una mscara para polvo de las que se consiguen en la [place], una bolsa de aspiradora de doble capa o microfiltro o una aspiradora con un filtro HEPA.  Reemplace las alfombras por pisos de [place], baldosas o vinilo. Las alfombras pueden retener la caspa de los animales y [place].  Use almohadas, mantas y cubre colchones antialrgicos.  [place] sbanas y las mantas todas las semanas con agua caliente y squelas con aire caliente.  Use mantas de polister o algodn.  Limpie baos y cocinas con lavandina. Si fuera posible, pdale a alguien que vuelva a pintar las paredes de estas habitaciones con Ardelia Mems pintura resistente a los hongos. Aljese de las  habitaciones que se estn limpiando y pintando.  Lvese las manos con frecuencia. SOLICITE ATENCIN MDICA SI:   Respira con dificultad an cuando toma el medicamento para prevenir las crisis.  La mucosidad coloreada que expectora cuando tose (esputo) es ms espesa que lo habitual.  Su esputo cambia de un color claro o blanco a un color amarillo, verde, gris o sanguinolento.  Tiene trastornos ocasionados por el medicamento que est tomando (como urticaria, picazn, hinchazn o dificultades respiratorias).  Necesita un medicamento aliviador ms de 2 o 3 veces por semana.  Su flujo espiratorio mximo se mantiene entre el 50% y el 79% de su Pharmacist, hospital personal, despus de seguir el plan de accin durante 1hora.  Tiene fiebre. SOLICITE ATENCIN MDICA DE INMEDIATO SI:   Usted parece empeorar y no responde al tratamiento durante una crisis de asma.  Le falta el aire, incluso en reposo.  Le falta el aire an cuando hace muy poca actividad fsica.  Tiene dificultad para comer, beber o hablar debido a los sntomas del asma.  Siente dolor en el pecho.  Se le acelera la frecuencia cardaca.  Tiene los labios o las uas de tono [place].  Siente que est por desvanecerse, est mareado o se desmaya.  Su flujo mximo es Garment/textile technologist del 50% del Pharmacist, hospital personal. ASEGRESE DE QUE:  Comprende estas instrucciones.  Controlar su afeccin.  Recibir ayuda de inmediato si no mejora o si empeora. Document Released: 01/16/2005 Document Revised: 06/02/2013 [facility] Patient Information 2015 [place], [place]. This information is not intended to replace advice given to you by your health care provider. Make sure you discuss any questions you have with your health care provider.

## 2013-10-20 NOTE — Progress Notes (Signed)
Subjective:     Patient ID: Anne Warren, female   DOB: 11-18-1940, 73 y.o.   MRN: [id number]  HPI 73yo female presenting today for same day visit for acute asthma exacerbation. States she first began having problems on Friday (10/17/13) and describes her symptoms as increased shortness of breath, wheezing, and cough.  She is also experiencing throat and chest pain secondary to the cough.  She did not have a cold prior to the exacerbation and is not sure what triggered the attack.  Her symptoms are worse at night and she is having trouble sleeping due to her cough. She is out of her QVAR and has used her albuterol inhaler more often than usually during the past three days as a result.  She had not been using her albuterol inhaler more than normal prior to the exacerbation. She has a history of asthma exacerbations in the past and this is similar to previous exacerbations. No further complaints today.  Review of Systems  HENT: Negative for congestion and rhinorrhea.   Respiratory: Positive for cough, shortness of breath and wheezing.   Cardiovascular: Positive for chest pain.  Gastrointestinal: Negative for abdominal pain.  Psychiatric/Behavioral: Positive for sleep disturbance.       Objective:   Physical Exam  Constitutional: She appears well-developed and well-nourished. No distress.  HENT:  Head: Normocephalic and atraumatic.  Nose: Nose normal.  Mouth/Throat: Oropharynx is clear and moist. No oropharyngeal exudate.  Neck: Neck supple. No JVD present.  Cardiovascular: Normal rate, regular rhythm and normal heart sounds.  Exam reveals no gallop and no friction rub.   No murmur heard. Pulmonary/Chest: Effort normal. No stridor. No respiratory distress. She has wheezes. She has no rales. She exhibits tenderness.  No use of accessory muscles; Prior to Nebulizer Tx: expiratory wheeze with rhonchi and frequent coughs; After Nebulizer: expiratory wheeze still noted but improved, no rhonchi  noted, no coughs during exam  Abdominal: Soft. Bowel sounds are normal. She exhibits no distension and no mass. There is no tenderness.  Skin: Skin is warm and dry. She is not diaphoretic.  Psychiatric: She has a normal mood and affect. Her behavior is normal.       Assessment:     Please see Problem List for Assessment.     Plan:    Please see Problem List for Plan.

## 2013-10-20 NOTE — Assessment & Plan Note (Signed)
-  Duonebs treatment given in office with improvement of wheezing and cough -QVAR refilled earlier today by Dr. Ree Kida  -Counseled Mrs. Jamerson to take 2puffs BID -Albuterol refill not needed  -Counseled Mrs. Doiron to use this as a rescue inhaler -Prednisone 50mg  x 5day -Follow up with Dr. Ree Kida on 9/28 at 8:30am -Instructed to let us know if her breathing worsens

## 2013-10-27 ENCOUNTER — Encounter: Payer: Self-pay | Admitting: Family Medicine

## 2013-10-27 ENCOUNTER — Ambulatory Visit (INDEPENDENT_AMBULATORY_CARE_PROVIDER_SITE_OTHER): Payer: Medicare Other | Admitting: Family Medicine

## 2013-10-27 VITALS — BP 130/77 | HR 86 | Temp 98.0°F | Ht 61.0 in | Wt 127.3 lb

## 2013-10-27 DIAGNOSIS — J45909 Unspecified asthma, uncomplicated: Secondary | ICD-10-CM

## 2013-10-27 DIAGNOSIS — K219 Gastro-esophageal reflux disease without esophagitis: Secondary | ICD-10-CM

## 2013-10-27 DIAGNOSIS — J45901 Unspecified asthma with (acute) exacerbation: Secondary | ICD-10-CM

## 2013-10-27 DIAGNOSIS — J4541 Moderate persistent asthma with (acute) exacerbation: Secondary | ICD-10-CM

## 2013-10-27 MED ORDER — OMEPRAZOLE 20 MG PO CPDR: 20.0000 mg | DELAYED_RELEASE_CAPSULE | Freq: Every day | ORAL | Status: DC

## 2013-10-27 MED ORDER — FLUTICASONE-SALMETEROL 100-50 MCG/DOSE IN AEPB: 1.0000 | INHALATION_SPRAY | Freq: Two times a day (BID) | RESPIRATORY_TRACT | Status: DC

## 2013-10-27 NOTE — Assessment & Plan Note (Signed)
Patient reports symptoms 1-2 times per week. May be contributing to cough at night -start Omeprazole 20 mg daily

## 2013-10-27 NOTE — Assessment & Plan Note (Signed)
Acute exacerbation improved with prednisone -see asthma for plan

## 2013-10-27 NOTE — Assessment & Plan Note (Signed)
Patient continues to have flairs (3-4 over past 12 months) despite increased Qvar dose -stop Qvar -Start Advair one inhalation twice daily -continue albuterol as needed

## 2013-10-27 NOTE — Patient Instructions (Addendum)
Asthma - stop Qvar and start Advair one puff twice daily  Acid reflux - start omeprazole one tablet daily

## 2013-10-27 NOTE — Progress Notes (Addendum)
   Subjective:    Patient ID: ADIN LARICCIA, female    DOB: 08-28-1940, 73 y.o.   MRN: [id number]  HPI 73 y/o female presents for follow up of asthma exacerbation, seen last week in office, started on Prednisone for 5 days, reports improvement of sob, continues to have nightly cough, taking Qvar 80 mg 2 puffs BID, needed albuterol 1-2 times daily during exacerbation, has had 3-4 exacerbations over the past year, no clear trigger, seasonal allergies, takes Flonase daily  Also reports intermittent epigastric burning pain, patient refers to this pain as "acid reflux", occurs 1-2 times per week, no associated chest pain, not currently on medications for this symptom  Social - non-smoker  Phone spanish interpretor [phone] used to obtain history.    Review of Systems  Constitutional: Negative for fever, chills and fatigue.  Respiratory: Positive for cough and wheezing.   Cardiovascular: Negative for chest pain.  Gastrointestinal: Positive for abdominal pain. Negative for nausea, vomiting and diarrhea.       Objective:   Physical Exam Vitals: reveiwed Gen: pleasant female, NAD, accompanied by husband Cardiac: RRR, S1 and S2 present, no murmurs, no heaves/thrills Resp: scattered wheezes, normal work of breathing Abd: soft, no tenderness, normal bowel sounds     Assessment & Plan:  Please see problem specific assessment and plan.

## 2013-10-31 ENCOUNTER — Encounter: Payer: Self-pay | Admitting: Family Medicine

## 2013-10-31 ENCOUNTER — Telehealth: Payer: Self-pay | Admitting: Family Medicine

## 2013-10-31 ENCOUNTER — Ambulatory Visit (INDEPENDENT_AMBULATORY_CARE_PROVIDER_SITE_OTHER): Payer: Medicare Other | Admitting: Family Medicine

## 2013-10-31 ENCOUNTER — Ambulatory Visit (HOSPITAL_COMMUNITY)
Admission: RE | Admit: 2013-10-31 | Discharge: 2013-10-31 | Disposition: A | Discharge status: Home / Self Care | Payer: Medicare Other | Source: Ambulatory Visit | Attending: Family Medicine | Admitting: Family Medicine

## 2013-10-31 VITALS — BP 142/71 | HR 92 | Temp 99.5°F | Wt 124.4 lb

## 2013-10-31 DIAGNOSIS — K5732 Diverticulitis of large intestine without perforation or abscess without bleeding: Secondary | ICD-10-CM | POA: Insufficient documentation

## 2013-10-31 DIAGNOSIS — R300 Dysuria: Secondary | ICD-10-CM

## 2013-10-31 DIAGNOSIS — R110 Nausea: Secondary | ICD-10-CM

## 2013-10-31 DIAGNOSIS — R1030 Lower abdominal pain, unspecified: Secondary | ICD-10-CM | POA: Insufficient documentation

## 2013-10-31 HISTORY — DX: Diverticulitis of large intestine without perforation or abscess without bleeding: K57.32

## 2013-10-31 LAB — POCT URINALYSIS DIPSTICK
BILIRUBIN UA: NEGATIVE
Glucose, UA: NEGATIVE
Ketones, UA: NEGATIVE
LEUKOCYTES UA: NEGATIVE
NITRITE UA: NEGATIVE
PH UA: 8
Protein, UA: 100
Spec Grav, UA: 1.02
Urobilinogen, UA: 0.2

## 2013-10-31 LAB — POCT UA - MICROSCOPIC ONLY

## 2013-10-31 MED ORDER — ONDANSETRON 4 MG PO TBDP: 4.0000 mg | ORAL_TABLET | Freq: Three times a day (TID) | ORAL | Status: DC | PRN

## 2013-10-31 MED ORDER — SULFAMETHOXAZOLE-TMP DS 800-160 MG PO TABS: 1.0000 | ORAL_TABLET | Freq: Two times a day (BID) | ORAL | Status: DC

## 2013-10-31 NOTE — Progress Notes (Addendum)
   Subjective:    Patient ID: Anne Warren, female    DOB: 11/15/1940, 73 y.o.   MRN: [id number]  HPI 73 y/o female presented for nurse visit for abdominal pain, transitioned to acute visit.  Abdominal pain - abdominal pain for 4 days, initially epigastric however now mostly bilateral lower quadrant, associated nausea, no emesis however does report sour taste in mouth, tolerating liquids, nauseated with solids, last BM was last night (small amount and watery), reports associated pain and burning with urination, no vaginal bleeding/discharge/irritation, she is married however no sexual intercourse for over 5 years, recently treated for asthma exacerbation, started on omeprazole on Monday for GERD, no improvement with omeprazole, has also taken Mylanta and Tylenol, no sore throat/runny nose  SH - previous gallbladder removal   Review of Systems  Constitutional: Positive for fever, chills and fatigue.  Respiratory: Negative for shortness of breath and wheezing.   Cardiovascular: Negative for chest pain.  Gastrointestinal: Positive for nausea, abdominal pain and abdominal distention. Negative for vomiting, diarrhea, constipation, blood in stool and anal bleeding.       Objective:   Physical Exam Vitals: reviewed Gen: pleasant female, in mild distress HEENT: normocephalic, PERRL, EOMI, no scleral icterus, nasal septum midline, mild rhinorrhea, uvula midline, MMM, no pharyngeal erythema or exudate noted, neck supple, no adenopathy Cardiac: RRR, S1 and S2 present, no murmurs, no heaves/thrills Resp: scattered wheezes, normal effort Abd: soft, mild distension, diffuse tenderness (most tender in lower quadrants and over bladder), hypoactive bowel sounds, No CVA tenderness Skin: no rash   Urine dipstick - moderate blood present, negative LE and nitrate Urine microscopy: 0-3 WBC, 8-15 RBC, trace bacteria, 1-5 Epi      Assessment & Plan:  Please see problem specific assessment and plan.

## 2013-10-31 NOTE — Assessment & Plan Note (Addendum)
Patient presents for lower quadrant abdominal pain. Differential includes UTI vs nephrolithiasis vs Gastroenteritis. No GYN complaints. Currently treated for GERD. Urinalysis identified blood and some bacteria. UA not entirely consistent with UTI. Nephroilithiasis is also a possibility given blood in urine however symptoms are bilateral. Symptoms not consistent with gallbladder disease and patient is s/p cholecystectomy. Influenza is also in the differential.  -will treat empirically for UTI, urine sent for culture -patient sent for abdominal xray to evaluate for nephronophthisis -Zofran provided for nausea -Will call patient after xray obtained

## 2013-10-31 NOTE — Addendum Note (Signed)
Addended by: [place], Ramani Riva on: 10/31/2013 11:13 AM   Modules accepted: Orders

## 2013-10-31 NOTE — Patient Instructions (Signed)
Please go over to the hospital to have an xray of your abdomen.  Please start antibiotic for possible urinary tract infection.

## 2013-10-31 NOTE — Telephone Encounter (Signed)
Left message on voice mail using spanish line (interpretor [phone]) that xray of the stomach was normal, no evidence of kidney stones, will have nurse call to schedule an appointment early next week, if the pain worsens over the weekend she was told to go to the emergency room  Note to nursing staff - please call patient and schedule follow up Monday or Tuesday (oct 5 or 6) morning with me, ok to double book, thanks

## 2013-11-01 LAB — URINE CULTURE: Colony Count: 35000

## 2013-11-03 NOTE — Telephone Encounter (Signed)
Got ramon to leave a message for her to call back and schedule an appt.Tram Wrenn CMA

## 2013-11-04 NOTE — Telephone Encounter (Signed)
Patient and husband stopped by office as they thought that the message said to come to the office. No acute visits available. Patient states that abdominal pain is much improved. Taking Bactrim as prescribed. Told patient to complete therapy and return if pain recurs/does not fully resolve.

## 2013-11-10 ENCOUNTER — Ambulatory Visit: Payer: Medicare Other | Admitting: Family Medicine

## 2013-11-17 ENCOUNTER — Ambulatory Visit (INDEPENDENT_AMBULATORY_CARE_PROVIDER_SITE_OTHER): Payer: Medicare Other | Admitting: Family Medicine

## 2013-11-17 ENCOUNTER — Telehealth: Payer: Self-pay | Admitting: *Deleted

## 2013-11-17 ENCOUNTER — Telehealth: Payer: Self-pay | Admitting: Family Medicine

## 2013-11-17 ENCOUNTER — Encounter: Payer: Self-pay | Admitting: Family Medicine

## 2013-11-17 VITALS — BP 156/84 | HR 88 | Temp 98.6°F | Ht 61.0 in | Wt 123.2 lb

## 2013-11-17 DIAGNOSIS — R109 Unspecified abdominal pain: Secondary | ICD-10-CM

## 2013-11-17 DIAGNOSIS — R319 Hematuria, unspecified: Secondary | ICD-10-CM

## 2013-11-17 LAB — POCT URINALYSIS DIPSTICK
Bilirubin, UA: NEGATIVE
Glucose, UA: NEGATIVE
Ketones, UA: NEGATIVE
LEUKOCYTES UA: NEGATIVE
NITRITE UA: NEGATIVE
Protein, UA: NEGATIVE
Spec Grav, UA: <=1.005
UROBILINOGEN UA: 0.2
pH, UA: 5

## 2013-11-17 LAB — POCT UA - MICROSCOPIC ONLY

## 2013-11-17 MED ORDER — POLYETHYLENE GLYCOL 3350 17 GM/SCOOP PO POWD: 17.0000 g | Freq: Two times a day (BID) | ORAL | Status: DC | PRN

## 2013-11-17 NOTE — Patient Instructions (Signed)
Please start Miralax daily.  My nurse will call you to schedule and appointment at the hospital for a CT scan of your abdomen.

## 2013-11-17 NOTE — Telephone Encounter (Signed)
Returned call to [employer]. Spoke to physician representative. CT scan for Ms. Barreras approved. Approval number is [id number].

## 2013-11-17 NOTE — Telephone Encounter (Signed)
Called and LMOVM with spanish interpreter (leah [phone]) to call us back and schedule appt to get labs done. [place], Deseree CMA

## 2013-11-18 DIAGNOSIS — R319 Hematuria, unspecified: Secondary | ICD-10-CM | POA: Insufficient documentation

## 2013-11-18 HISTORY — DX: Hematuria, unspecified: R31.9

## 2013-11-18 NOTE — Progress Notes (Signed)
   Subjective:    Patient ID: Anne Warren, female    DOB: Aug 07, 1940, 73 y.o.   MRN: [id number]  HPI 73 y/o female presents for follow up of abdominal pain, seen a few weeks ago with bilateral lower quadrant abdominal pain, treated for presumed UTI at that time with bactrim, seen in passing in office a few days later and pain had improved, patient states that pain has returned, no has associated abdominal bloating and nausea, no emesis, stools are small and hard, no melena, she also reports pain with urination, no blood in urine, has decreased appetite, no fevers/chills, no vaginal discharge or irritation  Social - lives with husband   Review of Systems  Constitutional: Negative for fever, chills and fatigue.  Respiratory: Negative for cough and shortness of breath.   Gastrointestinal: Positive for nausea, abdominal pain, constipation and abdominal distention. Negative for vomiting, diarrhea, blood in stool and rectal pain.       Objective:   Physical Exam Vitals: reviewed Gen: pleasant Hispanic female, accompanied by husband, spanish interpretor present Cardiac: RRR, S1 and S2 present, no murmur, no heaves/thrills Resp: CTAB, normal effort Abd: soft, mild distension, normoactive bowel sounds, bilateral lower quadrant tenderness, no guarding TLX:[id number] present, normal external female genitalia, no evidence of atrophy, no cervical motion tenderness, no gross mass on bimanual exam Rectal: small hemorrhoid at 12 oclock position, soft stool in vault, no internal masses, stool occult negative for blood   Reviewed urine obtained today (see lab results) Reviewed xray from previous visit - no obstruction, no renal calculi Last colonoscopy in 2011 was unremarkable Stool occult negative     Assessment & Plan:  Please see problem specific assessment and plan.

## 2013-11-18 NOTE — Assessment & Plan Note (Signed)
Patient has persistent hematuria on UA.  -will check CT abd/pelvis to evaluate for nephrolithiasis and renal mass especially in setting of associated abdominal pain.

## 2013-11-18 NOTE — Assessment & Plan Note (Addendum)
Patient continues to have bilateral lower quadrant abdominal pain. Symptoms most consistent with constipation. Last colonoscopy in 2011 unremarkable. Rectal/GYN exam unremarkable. Urine culture negative for infection and completed course of Bactrim.  -given length of symptoms will check CT abd/pelvis.   -start trial of Miralax

## 2013-11-19 ENCOUNTER — Other Ambulatory Visit: Payer: Medicare Other

## 2013-11-19 DIAGNOSIS — R109 Unspecified abdominal pain: Secondary | ICD-10-CM

## 2013-11-19 LAB — BASIC METABOLIC PANEL
BUN: 10 mg/dL (ref 6–23)
CALCIUM: 9.4 mg/dL (ref 8.4–10.5)
CO2: 28 meq/L (ref 19–32)
CREATININE: 0.73 mg/dL (ref 0.50–1.10)
Chloride: 104 mEq/L (ref 96–112)
Glucose, Bld: 92 mg/dL (ref 70–99)
Potassium: 4.5 mEq/L (ref 3.5–5.3)
SODIUM: 142 meq/L (ref 135–145)

## 2013-11-19 NOTE — Progress Notes (Signed)
BMP DONE TODAY Anne Warren 

## 2013-11-21 ENCOUNTER — Other Ambulatory Visit: Payer: Self-pay | Admitting: Family Medicine

## 2013-11-21 ENCOUNTER — Ambulatory Visit (HOSPITAL_COMMUNITY)
Admission: RE | Admit: 2013-11-21 | Discharge: 2013-11-21 | Disposition: A | Discharge status: Home / Self Care | Payer: Medicare Other | Source: Ambulatory Visit | Attending: Family Medicine | Admitting: Family Medicine

## 2013-11-21 ENCOUNTER — Telehealth: Payer: Self-pay | Admitting: Family Medicine

## 2013-11-21 DIAGNOSIS — K5720 Diverticulitis of large intestine with perforation and abscess without bleeding: Secondary | ICD-10-CM | POA: Diagnosis not present

## 2013-11-21 DIAGNOSIS — K760 Fatty (change of) liver, not elsewhere classified: Secondary | ICD-10-CM | POA: Insufficient documentation

## 2013-11-21 DIAGNOSIS — R109 Unspecified abdominal pain: Secondary | ICD-10-CM

## 2013-11-21 DIAGNOSIS — G8929 Other chronic pain: Secondary | ICD-10-CM | POA: Diagnosis present

## 2013-11-21 MED ORDER — CIPROFLOXACIN HCL 500 MG PO TABS: 500.0000 mg | ORAL_TABLET | Freq: Two times a day (BID) | ORAL | Status: DC

## 2013-11-21 MED ORDER — IOHEXOL 300 MG/ML  SOLN
80.0000 mL | Freq: Once | INTRAMUSCULAR | Status: AC | PRN
Start: 2013-11-21 — End: 2013-11-21
  Administered 2013-11-21: 80 mL via INTRAVENOUS

## 2013-11-21 MED ORDER — METRONIDAZOLE 500 MG PO TABS: 500.0000 mg | ORAL_TABLET | Freq: Four times a day (QID) | ORAL | Status: DC

## 2013-11-21 NOTE — Telephone Encounter (Signed)
Attempted to contact patient with CT results using spanish interpretor ([phone]), no answer, left message that I will attempt to cal again later today

## 2013-11-21 NOTE — Telephone Encounter (Signed)
Attempted to call again using spanish interpretor([phone]), no answer, left voicemail stating that the CT showed diverticulitis, will send in two antibiotics, I will attempt to contact her again next week

## 2013-11-24 ENCOUNTER — Telehealth: Payer: Self-pay | Admitting: Family Medicine

## 2013-11-24 NOTE — Telephone Encounter (Signed)
Attempted to call patient using interpretor line ([phone]), again no answer, left message to call office if she is still having symptoms

## 2013-11-24 NOTE — Telephone Encounter (Signed)
Asking to speak with MD to give an update on the pt. Just started taking some meds, is feeling a little better but pts husband would like to speak with MD

## 2013-11-26 NOTE — Telephone Encounter (Signed)
Discussed results of recent CT with Verdis Frederickson. She is feeling better with antibiotics. Will have nursing staff call to scheduled follow up at my next available.

## 2013-11-26 NOTE — Telephone Encounter (Signed)
Patient scheduled on 11/30 at 11:30am with [facility], will have interpretor call and inform patient.

## 2013-11-28 NOTE — Telephone Encounter (Signed)
LMOVM with spanish interpretor (jose [id number]). Blount, Deseree CMA

## 2013-12-29 ENCOUNTER — Encounter: Payer: Self-pay | Admitting: Family Medicine

## 2013-12-29 ENCOUNTER — Ambulatory Visit (INDEPENDENT_AMBULATORY_CARE_PROVIDER_SITE_OTHER): Payer: Medicare Other | Admitting: Family Medicine

## 2013-12-29 VITALS — BP 158/78 | HR 82 | Temp 97.8°F | Ht 61.0 in | Wt 125.0 lb

## 2013-12-29 DIAGNOSIS — J4540 Moderate persistent asthma, uncomplicated: Secondary | ICD-10-CM

## 2013-12-29 DIAGNOSIS — K5732 Diverticulitis of large intestine without perforation or abscess without bleeding: Secondary | ICD-10-CM

## 2013-12-29 NOTE — Progress Notes (Signed)
Spanish interpreter Milton Ferguson [phone] Was used during this visit. Juleen Sorrels CMA

## 2013-12-29 NOTE — Assessment & Plan Note (Signed)
Acute episode of diverticulitis resolved. -counseled on addition of fiber to diet -will refer to GI for colonoscopy (last completed in 2010)

## 2013-12-29 NOTE — Patient Instructions (Addendum)
Asthma - start to use the Advair twice daily (once in the morning and once in the evening), only use the albuterol when needed (for emergencies).  Diverticulosis/Diverticulitis - I have referred you to the stomach doctor (GI doctor at [facility])  Acid Reflux - you have refills at your pharmacy  Diverticulosis (Diverticulosis) La diverticulosis es una enfermedad que aparece cuando se forman pequeos bolsillos (divertculos) en las paredes del colon. El colon, o intestino grueso, es el lugar donde se absorbe agua y se forman las heces. Los bolsillos se forman cuando la capa interna del colon ejerce presin sobre los puntos dbiles de las capas externas. CAUSAS  Nadie sabe con exactitud qu causa la diverticulosis. FACTORES DE RIESGO  Ser mayor de 75aos. El riesgo de desarrollar esta enfermedad aumenta con la edad. La diverticulosis es poco frecuente en las personas menores de [place]. A los 80aos, casi todas las personas tienen la enfermedad.  Comer una dieta con bajo contenido de [place].  Estar estreido con frecuencia.  Tener sobrepeso.  No hacer suficiente ejercicio fsico.  Fumar.  Tomar analgsicos de venta libre, como aspirina e ibuprofeno. SNTOMAS  La mayora de las personas que tienen diverticulosis no presentan sntomas. DIAGNSTICO  Dado que la diverticulosis no suele causar sntomas, los mdicos a menudo descubren la enfermedad durante un examen de otros problemas de colon. En muchos casos, el mdico diagnosticar la diverticulosis mientras utiliza un endoscopio flexible para examinar el colon (colonoscopa). TRATAMIENTO  Si nunca tuvo una infeccin relacionada con la diverticulosis, es posible que no necesite tratamiento. Si ha tenido una infeccin antes, el tratamiento puede incluir:  Comer ms frutas, verduras y cereales.  Tomar un suplemento de [place].  Tomar un suplemento de bacterias vivas (probitico).  Tomar medicamentos para relajar el colon. INSTRUCCIONES  PARA EL CUIDADO EN EL HOGAR   Beba por lo menos entre 6 y 8vasos de agua por da para Engineer, civil (consulting).  Trate de no hacer fuerza al mover el intestino.  Cumpla con todas las visitas de control. Si ha tenido una infeccin antes:   Aumente la cantidad de fibra en la dieta, segn las indicaciones del mdico o del nutricionista.  Tome un suplemento dietario con fibras si el mdico lo autoriza.  Tome los medicamentos solamente como se lo haya indicado el mdico. SOLICITE ATENCIN MDICA SI:   Siente dolor abdominal.  Tiene meteorismo.  Tiene clicos.  No ha defecado en 3das. SOLICITE ATENCIN MDICA DE INMEDIATO SI:   El dolor empeora.  El meteorismo [employer].  Tiene fiebre o escalofros, y los sntomas empeoran repentinamente.  Comienza a vomitar.  La materia fecal es sanguinolenta o negra. ASEGRESE DE QUE:  Comprende estas instrucciones.  Controlar su afeccin.  Recibir ayuda de inmediato si no mejora o si empeora. Document Released: 12/30/2007 Document Revised: 01/21/2013 [facility] Patient Information 2015 [place]. This information is not intended to replace advice given to you by your health care provider. Make sure you discuss any questions you have with your health care provider.

## 2013-12-29 NOTE — Progress Notes (Signed)
   Subjective:    Patient ID: Anne Warren, female    DOB: 04/14/1940, 73 y.o.   MRN: [id number]  HPI 73 y/o female presents for follow up of abdominal pain. Interview completed using spanish interpretor over the phone.   Abdominal pain - recently found to have diverticulitis on CT scan, completed course of Flagyl and Cipro, pain has resolved, does not have regular BM's, has started to take daily fiber, tolerating diet, no nausea/emesis, last colonoscopy was in 2010 (diverticulsosis and internal hemorrhoids present).  Asthma - patient reports worsening nighttime symptoms, has not been taking Advair, using albuterol twice daily  Social - lives with husband   Review of Systems  Constitutional: Negative for fever, chills and fatigue.  Respiratory: Positive for cough and wheezing. Negative for shortness of breath.   Cardiovascular: Negative for chest pain.  Gastrointestinal: Negative for nausea, diarrhea and abdominal distention.       Objective:   Physical Exam Vitals: reviewed Gen: pleasant Hispanic female, NAD Cardiac: RRR, S1 and S2 present, no murmurs, no heaves/thrills Resp: scattered wheezes, normal work of breathing Ext: no edema Abd: soft, no tenderness, normal bowel sounds, no rebound, no guarding      Assessment & Plan:  Please see problem specific assessment and plan.

## 2013-12-29 NOTE — Assessment & Plan Note (Signed)
Uncontrolled, needs albuterol inhaler twice daily over the past few weeks. Due to noncompliance with Advair (has not been using) -counseled on daily use of Advair -only to use Albuterol for emergencies -patient expressed understanding -return in one month for check on Asthma

## 2014-01-06 ENCOUNTER — Other Ambulatory Visit: Payer: Self-pay | Admitting: Gastroenterology

## 2014-01-06 DIAGNOSIS — K5720 Diverticulitis of large intestine with perforation and abscess without bleeding: Secondary | ICD-10-CM

## 2014-01-08 ENCOUNTER — Ambulatory Visit
Admission: RE | Admit: 2014-01-08 | Discharge: 2014-01-08 | Disposition: A | Discharge status: Home / Self Care | Payer: Medicare Other | Source: Ambulatory Visit | Attending: Gastroenterology | Admitting: Gastroenterology

## 2014-01-08 DIAGNOSIS — K5720 Diverticulitis of large intestine with perforation and abscess without bleeding: Secondary | ICD-10-CM

## 2014-01-08 MED ORDER — IOHEXOL 300 MG/ML  SOLN
100.0000 mL | Freq: Once | INTRAMUSCULAR | Status: AC | PRN
  Administered 2014-01-08: 100 mL via INTRAVENOUS

## 2014-01-12 ENCOUNTER — Telehealth: Payer: Self-pay | Admitting: *Deleted

## 2014-01-12 NOTE — Telephone Encounter (Signed)
Darrick Meigs, PA with [facility] left voice message on nurse line stating she did a home visit today with pt.  Assessed that pt's Asthma is uncontrolled.  Pt is using her Advair as prescribed and ProAir as needed.  Pt has been complaining of SOB for several months, but PCP is aware per pt.  Advised pt to call for follow up appt.  Pt is SOB with activity and lying down and coughing a lot.  No O2 sat down, did not have a way to measure.  Call with questions at [phone].  Derl Barrow, RN

## 2014-01-13 NOTE — Telephone Encounter (Signed)
Patient scheduled for 01/14/14.

## 2014-01-13 NOTE — Telephone Encounter (Signed)
Note to nursing staff - please call patient to offer appointment to discuss breathing (does not need to be with me), thanks

## 2014-01-14 ENCOUNTER — Ambulatory Visit (INDEPENDENT_AMBULATORY_CARE_PROVIDER_SITE_OTHER): Payer: Medicare Other | Admitting: Family Medicine

## 2014-01-14 ENCOUNTER — Encounter: Payer: Self-pay | Admitting: Family Medicine

## 2014-01-14 VITALS — BP 135/87 | HR 87 | Temp 98.1°F | Wt 123.0 lb

## 2014-01-14 DIAGNOSIS — J4541 Moderate persistent asthma with (acute) exacerbation: Secondary | ICD-10-CM

## 2014-01-14 MED ORDER — MONTELUKAST SODIUM 10 MG PO TABS: 10.0000 mg | ORAL_TABLET | Freq: Every day | ORAL | Status: DC

## 2014-01-14 MED ORDER — FLUTICASONE-SALMETEROL 250-50 MCG/DOSE IN AEPB: 1.0000 | INHALATION_SPRAY | Freq: Two times a day (BID) | RESPIRATORY_TRACT | Status: DC

## 2014-01-14 MED ORDER — PREDNISONE 50 MG PO TABS: ORAL_TABLET | ORAL | Status: DC

## 2014-01-14 NOTE — Patient Instructions (Addendum)
Fue bueno conocerte hoy!  Por favor recoja nuevo inhalador de la farmacia : empleo advair- este medicamento todos los [employer] veces al da Tambin debe utilizar el inhalador de albuterol cuatro veces al [employer] las prximas 24-48 horas Por favor, tambin toman pldoras de esteroides para los prximos 5 das para su llamarada ( usted puede tomar con tums )  Usted puede Risk manager adicionalmente Singulair en la noche para ayudar con los sntomas de alergia  Hacer cita con el Dr. Ree Kida despus de las vacaciones de invierno Felices vacaciones!  Bernadene Bell, MD

## 2014-01-14 NOTE — Assessment & Plan Note (Signed)
In another exacerbation Appears has not been compliant with medications in the past per PCP Unclear trigger this time but does have known allergies  Improved with neb tx here in house making cardiac wheeze less likely (also no JVD or peripheral edema) No echo seen on file  BP at goal for age Increase advair to 250-50 BID  Albuterol prn q4-6 hrs for the next 24-48 hrs  Add prednisone for 5 days  Trial of singulair for allergic sx F/up with Dr. Ree Kida in 2-3 weeks for reassessment

## 2014-01-14 NOTE — Progress Notes (Signed)
Patient ID: Anne Warren, female   DOB: 27-Jul-1940, 73 y.o.   MRN: [id number]   [facility] Bernadene Bell, MD Phone: [phone]  Subjective:   Anne Warren is a 73 y.o F who presents today for SDA  Asthma: Anne Warren presents for evaluation of shortness of breath. Pt was just seen by Dr. Ree Kida on 12/29/2013 for uncontrolled Asthma, which was felt to be 2/2 lack of controller use. Was instructed to start use of advair twice daily and albuterol prn at last visit    Patient's symptoms include dyspnea, non-productive cough and wheezing. Associated symptoms include nasal congestion. The patient has been suffering from these symptoms for approximately 3 days. Symptoms have been unchanged since their onset. Medications used in the past to treat these symptoms include beta agonist inhalers and combination beta agonists/steroid inhalers. Suspected precipitants include allergens (unidentified). Reports having to prop herself up on 3 pillows at night time for relief.   All relevant systems were reviewed and were negative unless otherwise noted in the HPI  Past Medical History Reviewed problem list.  Medications- reviewed and updated Current Outpatient Prescriptions  Medication Sig Dispense Refill  . albuterol (PROAIR HFA) 108 (90 BASE) MCG/ACT inhaler Inhale 2 puffs into the lungs every 4 (four) hours as needed. 2 Inhaler prn  . aspirin EC 81 MG tablet Take 1 tablet (81 mg total) by mouth daily. 90 tablet 1  . ciprofloxacin (CIPRO) 500 MG tablet Take 1 tablet (500 mg total) by mouth 2 (two) times daily. 20 tablet 0  . fluocinonide cream (LIDEX) 0.96 % Apply 1 application topically 2 (two) times daily.    . fluticasone (FLONASE) 50 MCG/ACT nasal spray Place 2 sprays into both nostrils daily. 16 g 1  . Fluticasone-Salmeterol (ADVAIR) 250-50 MCG/DOSE AEPB Inhale 1 puff into the lungs 2 (two) times daily. 60 each 3  . ibuprofen (ADVIL,MOTRIN) 200 MG tablet Take 400 mg by mouth  every 6 (six) hours as needed for pain.    . metroNIDAZOLE (FLAGYL) 500 MG tablet Take 1 tablet (500 mg total) by mouth 4 (four) times daily. 40 tablet 0  . montelukast (SINGULAIR) 10 MG tablet Take 1 tablet (10 mg total) by mouth at bedtime. 30 tablet 3  . omeprazole (PRILOSEC) 20 MG capsule Take 1 capsule (20 mg total) by mouth daily. 30 capsule 3  . ondansetron (ZOFRAN ODT) 4 MG disintegrating tablet Take 1 tablet (4 mg total) by mouth every 8 (eight) hours as needed for nausea or vomiting. 20 tablet 0  . polyethylene glycol powder (GLYCOLAX/MIRALAX) powder Take 17 g by mouth 2 (two) times daily as needed. 3350 g 1  . predniSONE (DELTASONE) 50 MG tablet For the next 5 days 5 tablet 0   No current facility-administered medications for this visit.   Chief complaint-noted No additions to family history Social history- patient is a never smoker  Objective: BP 135/87 mmHg  Pulse 87  Temp(Src) 98.1 F (36.7 C) (Oral)  Wt 123 lb (55.792 kg)  SpO2 95% Gen: NAD, alert, cooperative with exam HEENT: NCAT, EOMI, PERRL, TMs nml, mildly erythematous throat  Neck: FROM, supple CV: RRR, good S1/S2, no murmur, cap refill <3, no JVD Resp: coarse breath sounds throughout with expiratory wheeze  Abd: SNTND, BS present, no guarding or organomegaly Ext: No edema, warm, normal tone, moves UE/LE spontaneously Neuro: Alert and oriented, No gross deficits Skin: no rashes no lesions  Assessment/Plan: See problem based a/p

## 2014-04-06 ENCOUNTER — Ambulatory Visit (INDEPENDENT_AMBULATORY_CARE_PROVIDER_SITE_OTHER): Payer: Medicare Other | Admitting: Family Medicine

## 2014-04-06 ENCOUNTER — Encounter: Payer: Self-pay | Admitting: Family Medicine

## 2014-04-06 ENCOUNTER — Ambulatory Visit (HOSPITAL_COMMUNITY)
Admission: RE | Admit: 2014-04-06 | Discharge: 2014-04-06 | Disposition: A | Discharge status: Home / Self Care | Payer: Medicare Other | Source: Ambulatory Visit | Attending: Family Medicine | Admitting: Family Medicine

## 2014-04-06 DIAGNOSIS — I700 Atherosclerosis of aorta: Secondary | ICD-10-CM | POA: Diagnosis not present

## 2014-04-06 DIAGNOSIS — R05 Cough: Secondary | ICD-10-CM | POA: Insufficient documentation

## 2014-04-06 DIAGNOSIS — J45901 Unspecified asthma with (acute) exacerbation: Secondary | ICD-10-CM | POA: Diagnosis not present

## 2014-04-06 DIAGNOSIS — J4541 Moderate persistent asthma with (acute) exacerbation: Secondary | ICD-10-CM

## 2014-04-06 MED ORDER — MONTELUKAST SODIUM 10 MG PO TABS: 10.0000 mg | ORAL_TABLET | Freq: Every day | ORAL | Status: DC

## 2014-04-06 MED ORDER — PREDNISONE 50 MG PO TABS: ORAL_TABLET | ORAL | Status: DC

## 2014-04-06 NOTE — Patient Instructions (Signed)
Continue Advair twice daily, start Singulair daily (new medication), take albuterol as needed for cough, take prednisone for 5 days.   Return in 2 weeks.  Diverticulosis Diverticulosis is the condition that develops when small pouches (diverticula) form in the wall of your colon. Your colon, or large intestine, is where water is absorbed and stool is formed. The pouches form when the inside layer of your colon pushes through weak spots in the outer layers of your colon. CAUSES  No one knows exactly what causes diverticulosis. RISK FACTORS  Being older than 55. Your risk for this condition increases with age. Diverticulosis is rare in people younger than 40 years. By age 47, almost everyone has it.  Eating a low-fiber diet.  Being frequently constipated.  Being overweight.  Not getting enough exercise.  Smoking.  Taking over-the-counter pain medicines, like aspirin and ibuprofen. SYMPTOMS  Most people with diverticulosis do not have symptoms. DIAGNOSIS  Because diverticulosis often has no symptoms, health care providers often discover the condition during an exam for other colon problems. In many cases, a health care provider will diagnose diverticulosis while using a flexible scope to examine the colon (colonoscopy). TREATMENT  If you have never developed an infection related to diverticulosis, you may not need treatment. If you have had an infection before, treatment may include:  Eating more fruits, vegetables, and grains.  Taking a fiber supplement.  Taking a live bacteria supplement (probiotic).  Taking medicine to relax your colon. HOME CARE INSTRUCTIONS   Drink at least 6-8 glasses of water each day to prevent constipation.  Try not to strain when you have a bowel movement.  Keep all follow-up appointments. If you have had an infection before:  Increase the fiber in your diet as directed by your health care provider or dietitian.  Take a dietary fiber supplement  if your health care provider approves.  Only take medicines as directed by your health care provider. SEEK MEDICAL CARE IF:   You have abdominal pain.  You have bloating.  You have cramps.  You have not gone to the bathroom in 3 days. SEEK IMMEDIATE MEDICAL CARE IF:   Your pain gets worse.  Yourbloating becomes very bad.  You have a fever or chills, and your symptoms suddenly get worse.  You begin vomiting.  You have bowel movements that are bloody or black. MAKE SURE YOU:  Understand these instructions.  Will watch your condition.  Will get help right away if you are not doing well or get worse. Document Released: 10/14/2003 Document Revised: 01/21/2013 Document Reviewed: 12/11/2012 [facility] Patient Information 2015 [place], [place]. This information is not intended to replace advice given to you by your health care provider. Make sure you discuss any questions you have with your health care provider.

## 2014-04-06 NOTE — Assessment & Plan Note (Signed)
Patient has acute exacerbation of poorly controlled asthma.  -5 day steroid burst -check CXR to rule out PNA -continue Advair 250/50 twice daily -Start Singulair daily -Encouraged use of Albuterol with cough/wheeze/sob -Follow up scheduled

## 2014-04-06 NOTE — Progress Notes (Signed)
   Subjective:    Patient ID: Anne Warren, female    DOB: 1940/08/21, 74 y.o.   MRN: [id number]  HPI 74 y/o female presents for follow up of asthma.  Asthma - currently on Advair 250/50 twice daily, Qvar daily (not supposed to be taking this medication), and albuterol prn. Last asthma exacerbation was in 12/2013. Did well for a few weeks after treatment with prednisone, reports worsening symptoms since that time including sob and cough, only uses albuterol a few times per week however does notice an improvement of symptoms when she uses her inhaler, was prescribed singulair at last visit however has not started taking. Reports no fevers/chills/runny nose/sore throat. Cough is mildly productive of sputum, has associated tightness in bilateral back at level of diaphragm. No frontal chest tightness/tenderness.   Social - not a smoker   Review of Systems  Constitutional: Negative for fever, chills and fatigue.  Respiratory: Positive for cough, shortness of breath and wheezing.   Cardiovascular: Negative for chest pain.  Gastrointestinal: Negative for nausea, vomiting and diarrhea.       Objective:   Physical Exam Vitals: reviewed Gen: pleasant female, accompanied by husband, NAD HEENT: normocephalic, PERRL, EOMI, no scleral icterus, MMM, nasal septum midline, no rhinorrhea Cardiac: RRR, S1 and S2 present, no murmur, no heaves/thrills Resp: scattered wheezes, poor air entry MSK: no back tenderness no palpation      Assessment & Plan:  Please see problem specific assessment and plan.

## 2014-04-07 ENCOUNTER — Telehealth: Payer: Self-pay | Admitting: Family Medicine

## 2014-04-07 NOTE — Telephone Encounter (Signed)
Left message that no pneumonia on CXR.

## 2014-04-28 ENCOUNTER — Ambulatory Visit: Payer: Medicare Other | Admitting: Family Medicine

## 2014-05-05 ENCOUNTER — Encounter: Payer: Self-pay | Admitting: Family Medicine

## 2014-05-05 ENCOUNTER — Ambulatory Visit (INDEPENDENT_AMBULATORY_CARE_PROVIDER_SITE_OTHER): Payer: Medicare Other | Admitting: Family Medicine

## 2014-05-05 VITALS — BP 145/74 | HR 82 | Temp 98.1°F | Ht 61.0 in | Wt 125.2 lb

## 2014-05-05 DIAGNOSIS — J4541 Moderate persistent asthma with (acute) exacerbation: Secondary | ICD-10-CM

## 2014-05-05 DIAGNOSIS — K5732 Diverticulitis of large intestine without perforation or abscess without bleeding: Secondary | ICD-10-CM

## 2014-05-05 DIAGNOSIS — K219 Gastro-esophageal reflux disease without esophagitis: Secondary | ICD-10-CM | POA: Diagnosis not present

## 2014-05-05 MED ORDER — PREDNISONE 10 MG PO TABS: ORAL_TABLET | ORAL | Status: DC

## 2014-05-05 NOTE — Assessment & Plan Note (Signed)
Patient continues to have diffuse wheezes on exam consistent with asthma exacerbation. CXR obtained after last visit negative for PNA.  -start longer steroid burst (40 mg for 3 days, 30 mg for 3 days, 20 mg for 3 days, 10 mg for 3 days) -continue Advair, Singulair, and Albuterol

## 2014-05-05 NOTE — Patient Instructions (Signed)
Asthma - restart steroid burst however will treat for longer this time, continue Advair and Singulair  Stomach pain - likely combination of acid reflux and trouble breathing, continue omeprazole  Stomach bloating - likely due to constipation, start to take Miralax daily

## 2014-05-05 NOTE — Assessment & Plan Note (Signed)
No current symptoms of diverticulosos however does report some bloating and constipation. -encouraged daily use of Miralax

## 2014-05-05 NOTE — Assessment & Plan Note (Signed)
Patient has epigastric abdominal pain likely GERD however also may be exacerbated by acute asthma flare. -continue omeprazole daily

## 2014-05-05 NOTE — Progress Notes (Signed)
   Subjective:    Patient ID: Anne Warren, female    DOB: 10-05-1940, 74 y.o.   MRN: [id number]  HPI 74 y/o female presents for follow up of asthma.  Asthma - recently treated with steroid burst and started on Singulair, reports some improvement of symptoms however still has nighttime wheezing, taking Advair twice daily and using albuterol prn  Abdominal pain - epigastric, radiates to sides over diaphram, unable to describe in more detail, no alleviating or aggravating factors, taking omeprazole daily  Also reports some abdominal bloating, has history of diverticulosis, no lower abdominal pain, occasional constipation, not taking Miralax daily, no nausea or emesis  Social - lives with husband   Review of Systems  Constitutional: Negative for fever, chills and fatigue.  Respiratory: Positive for cough, shortness of breath and wheezing.   Cardiovascular: Negative for chest pain.  Gastrointestinal: Positive for abdominal pain and abdominal distention. Negative for nausea, vomiting and diarrhea.       Objective:   Physical Exam Vitals: reviewed, SpO2 97% on RA Gen: pleasant hispanic female, NAD Cardiac: RRR, S1 and S2 present, no murmur, no heaves/thrills Resp: diffuse wheezes, normal effort, air entry present in all lung fields Abd: soft, epigastric tenderness, normal bowel sounds, no organomegaly     Assessment & Plan:  Please see problem specific assessment and plan.

## 2014-05-19 ENCOUNTER — Ambulatory Visit: Payer: Medicare Other | Admitting: Family Medicine

## 2014-07-03 ENCOUNTER — Encounter: Payer: Self-pay | Admitting: Family Medicine

## 2014-07-03 ENCOUNTER — Ambulatory Visit (INDEPENDENT_AMBULATORY_CARE_PROVIDER_SITE_OTHER): Payer: Medicare Other | Admitting: Family Medicine

## 2014-07-03 ENCOUNTER — Ambulatory Visit
Admission: RE | Admit: 2014-07-03 | Discharge: 2014-07-03 | Disposition: A | Discharge status: Home / Self Care | Payer: Medicare Other | Source: Ambulatory Visit | Attending: Family Medicine | Admitting: Family Medicine

## 2014-07-03 VITALS — BP 158/75 | HR 77 | Temp 98.3°F | Wt 121.0 lb

## 2014-07-03 DIAGNOSIS — J45901 Unspecified asthma with (acute) exacerbation: Secondary | ICD-10-CM

## 2014-07-03 DIAGNOSIS — R079 Chest pain, unspecified: Secondary | ICD-10-CM | POA: Diagnosis not present

## 2014-07-03 DIAGNOSIS — K219 Gastro-esophageal reflux disease without esophagitis: Secondary | ICD-10-CM | POA: Diagnosis not present

## 2014-07-03 DIAGNOSIS — J4541 Moderate persistent asthma with (acute) exacerbation: Secondary | ICD-10-CM

## 2014-07-03 MED ORDER — ALBUTEROL SULFATE (2.5 MG/3ML) 0.083% IN NEBU
2.5000 mg | INHALATION_SOLUTION | Freq: Once | RESPIRATORY_TRACT | Status: AC
  Administered 2014-07-03: 2.5 mg via RESPIRATORY_TRACT

## 2014-07-03 MED ORDER — ALBUTEROL SULFATE HFA 108 (90 BASE) MCG/ACT IN AERS: 2.0000 | INHALATION_SPRAY | RESPIRATORY_TRACT | Status: DC | PRN

## 2014-07-03 MED ORDER — OMEPRAZOLE 20 MG PO CPDR: 20.0000 mg | DELAYED_RELEASE_CAPSULE | Freq: Every day | ORAL | Status: DC

## 2014-07-03 MED ORDER — PREDNISONE 10 MG PO TABS: ORAL_TABLET | ORAL | Status: DC

## 2014-07-03 NOTE — Patient Instructions (Addendum)
Nice to see you. You are likely having an asthma exacerbation.  We will treat you with prednisone taper. You should take your albuterol inhaler every 4 hours for the next 2 days.  Please go get the chest x-ray today to evaluate for other causes.  We are also going to start you on prilosec for reflux as this could be causing your cough.  We will refer you to cardiology for further evaluation of your intermittent chest pain.  If you develop chest pain, trouble breathing, sweating, fever, or worsening cough please seek medical attention.

## 2014-07-06 DIAGNOSIS — R079 Chest pain, unspecified: Secondary | ICD-10-CM | POA: Insufficient documentation

## 2014-07-06 HISTORY — DX: Chest pain, unspecified: R07.9

## 2014-07-06 NOTE — Assessment & Plan Note (Signed)
Intermittent chest pain likely related to poorly controlled asthma, though in patient with risk factors for cardiac disease could be stable angina. EKG with no ischemic changes today. No chest pain at this time. No evidence of volume overload on exam or CXR to indicate CHF as cause. Will refer to cardiology for further evaluation. Given return precautions.

## 2014-07-06 NOTE — Progress Notes (Signed)
Patient ID: Anne Warren, female   DOB: 1941-01-03, 74 y.o.   MRN: [id number]  Tommi Rumps, MD Phone: [phone]  Anne Warren is a 74 y.o. female who presents today for same day appointment.   Patient presents with several days of increasing cough productive of phlegm and dyspnea. Notes this started after she was outside in wet weather. Notes she has baseline dyspnea, though is mildly worse at this time. Has been using albuterol which helps with dyspnea, though makes her cough worse. She not dyspnea with walking. Notes a pressure in her chest with walking. Notes intermittent chest pressure for 3+ years. No radiation or diaphoresis with this. Notes always has wheezing. No chest pain at this time. Has not been seen by cardiology previously. Also notes a burning reflux and sour taste in back of mouth. No epigastric pain.   PMH: former smoker.   ROS: Per HPI   Physical Exam Filed Vitals:   07/03/14 1347  BP: 158/75  Pulse: 77  Temp: 98.3 F (36.8 C)  O2 sat 94% on ambulation  Gen: Well NAD HEENT: PERRL,  MMM Lungs: scattered expiratory wheezes, no crackles, Nl WOB Heart: RRR, no murmur appreciated Abd: soft, NT, ND Exts: Non edematous BL  LE, warm and well perfused.   EKG: NSR, rate 76, no ST or T wave changes  Assessment/Plan: Please see individual problem list.  Tommi Rumps, MD [employer] PGY-3

## 2014-07-06 NOTE — Assessment & Plan Note (Addendum)
Symptoms consistent with asthma exacerbation. O2 sat 94% on ambulation. CXR with no focal findings. Unlikely to be PNA. Had improvement with albuterol nebulizer in the office with decreased wheezing and resolution of dyspnea. Will treat with steroid taper. Albuterol q4 hours for 2 days then q4 hours as needed. Continue advair and singulair. Given return precations.

## 2014-07-06 NOTE — Assessment & Plan Note (Signed)
Continues to have GERD symptoms. Is not taking prilosec so will restart this. Given return precautions.

## 2014-07-07 ENCOUNTER — Telehealth: Payer: Self-pay | Admitting: *Deleted

## 2014-07-07 NOTE — Telephone Encounter (Signed)
-----   Message from Leone Haven, MD sent at 07/06/2014  4:51 PM EDT ----- Please call patient and inform that x-ray revealed no evidence of pneumonia and appears improved from prior x-ray. Thanks.

## 2014-07-07 NOTE — Telephone Encounter (Signed)
Used [employer] interpre[employer] Herbie [place] RT#[id number].  LM for patient to call back.  Lil Lepage,CMA

## 2014-07-08 ENCOUNTER — Encounter: Payer: Self-pay | Admitting: *Deleted

## 2014-07-08 NOTE — Telephone Encounter (Signed)
Letter mailed to patient with normal results. Tamaka Sawin,CMA  

## 2014-07-09 ENCOUNTER — Telehealth: Payer: Self-pay | Admitting: Family Medicine

## 2014-07-09 NOTE — Telephone Encounter (Signed)
Please call patient and advise that she needs to see cardiology. If need be I can replace the referral.

## 2014-07-09 NOTE — Telephone Encounter (Signed)
Called pt 3 times to sched  Leaving voice messages to call back to sched.Marland KitchenSpecialty Services Required/Chest pain/ref Dr Caryl Bis, ERIC/NewPt per referral WQ. Pt never called back to sched. Removing pt from WQ.   Thanks  [place] [facility]

## 2014-07-16 ENCOUNTER — Telehealth: Payer: Self-pay | Admitting: Family Medicine

## 2014-07-16 NOTE — Telephone Encounter (Signed)
Pt informed by graciella of previous messages. Ladoris Lythgoe Kennon Holter, CMA

## 2014-07-16 NOTE — Telephone Encounter (Signed)
Patient requests to call her back because she couldn't understand message left while she was out of [place]. Please, follow up with Patient or her husband (Spanish).

## 2014-07-21 NOTE — Telephone Encounter (Signed)
Mailed letter to patient to please contact cardiology to schedule a new patient appt. Jazmin Hartsell,CMA

## 2014-08-24 ENCOUNTER — Encounter: Payer: Self-pay | Admitting: Internal Medicine

## 2014-08-24 ENCOUNTER — Ambulatory Visit (INDEPENDENT_AMBULATORY_CARE_PROVIDER_SITE_OTHER): Payer: Medicare Other | Admitting: Internal Medicine

## 2014-08-24 VITALS — BP 144/82 | HR 78 | Ht 61.0 in | Wt 122.4 lb

## 2014-08-24 DIAGNOSIS — J4541 Moderate persistent asthma with (acute) exacerbation: Secondary | ICD-10-CM | POA: Diagnosis not present

## 2014-08-24 DIAGNOSIS — R079 Chest pain, unspecified: Secondary | ICD-10-CM | POA: Diagnosis not present

## 2014-08-24 MED ORDER — PREDNISONE 10 MG PO TABS: ORAL_TABLET | ORAL | Status: DC

## 2014-08-24 NOTE — Patient Instructions (Addendum)
Medication Instructions:   TAKE PREDNISONE  AS DIRECTED   Take 5 tablets for 3 days, take 4 tablets for 3 days, take 3 tablets for 3 day, then take 2 tablets for 3 days, then take 1 tablet for 7 days   Labwork:   Testing/Procedures:   Follow-Up:  IN 2 MONTHS WITH DR ROSS   FOLLOW UP AT [address] FOR PULMONOLOGY APPT   Any Other Special Instructions Will Be Listed Below (If Applicable).

## 2014-08-24 NOTE — Progress Notes (Signed)
Cardiology Office Note   Date:  08/24/2014   ID:  Anne Warren, Anne Warren [birth year], MRN [id number]  PCP:  Anne Dawn, MD  Cardiologist:   Anne Carnes, MD   No chief complaint on file.  Presents for SOB and chest pain     History of Present Illness: Anne Warren is a 74 y.o. female with a history of  Chest pain Patient with interpretter Has asthma  Some pressure  Sometimes has pain in chest  All of a sudden CoughPressure happens with activity   Wheezing a lot   Last winter was bad She was placed on prednisone earlier this spring "That is only thing that has helped"  Current Outpatient Prescriptions  Medication Sig Dispense Refill  . albuterol (PROAIR HFA) 108 (90 BASE) MCG/ACT inhaler Inhale 2 puffs into the lungs every 4 (four) hours as needed. 2 Inhaler 0  . fluticasone (FLONASE) 50 MCG/ACT nasal spray Place 2 sprays into both nostrils daily. 16 g 1  . Fluticasone-Salmeterol (ADVAIR) 250-50 MCG/DOSE AEPB Inhale 1 puff into the lungs 2 (two) times daily. 60 each 3  . ibuprofen (ADVIL,MOTRIN) 200 MG tablet Take 400 mg by mouth every 6 (six) hours as needed for pain.    . montelukast (SINGULAIR) 10 MG tablet Take 1 tablet (10 mg total) by mouth at bedtime. 30 tablet 3  . omeprazole (PRILOSEC) 20 MG capsule Take 1 capsule (20 mg total) by mouth daily. 30 capsule 3  . ondansetron (ZOFRAN ODT) 4 MG disintegrating tablet Take 1 tablet (4 mg total) by mouth every 8 (eight) hours as needed for nausea or vomiting. 20 tablet 0  . polyethylene glycol powder (GLYCOLAX/MIRALAX) powder Take 17 g by mouth 2 (two) times daily as needed. 3350 g 1  . predniSONE (DELTASONE) 10 MG tablet Take 4 tablets for 3 days, take 3 tablets for 3 days, take 2 tablets for 3 day, then take 1 tablet for 3 days 30 tablet 0   No current facility-administered medications for this visit.    Allergies:   Atorvastatin; Simvastatin; Amitriptyline hcl; and Fish oil   Past Medical History  Diagnosis Date  .  Asthma   . Allergy     Past Surgical History  Procedure Laterality Date  . Cholecystectomy  09/19/2006  . Fracture surgery  09/18/2005    L ankle  . Breast surgery    . Cataract extraction, bilateral  01/31/84    L breast biospsy benign  . Bunionectomy       Social History:  The patient  reports that she has quit smoking. Her smoking use included Cigarettes. She has a .1 pack-year smoking history. She has never used smokeless tobacco. She reports that she does not drink alcohol or use illicit drugs.   Family History:  The patient's family history includes Cancer in her sister and sister; Depression in her sister; Heart disease in her mother; Stroke in her brother and father.    ROS:  Please see the history of present illness. All other systems are reviewed and  Negative to the above problem except as noted.    PHYSICAL EXAM: VS:  BP 144/82 mmHg  Pulse 78  Ht 5\' 1"  (1.549 m)  Wt 122 lb 6.4 oz (55.52 kg)  BMI 23.14 kg/m2  O2 sats 94% with walking   GEN: Thin 74 yo in no acute distress HEENT: normal Neck: no JVD, carotid bruits, or masses Cardiac: RRR; no murmurs, rubs, or gallops,no edema  Respiratory:  Decreased airflow  Diffuse wheeze GI: soft, nontender, nondistended, + BS  No hepatomegaly  MS: no deformity Moving all extremities   Skin: warm and dry, no rash Neuro:  Strength and sensation are intact Psych: euthymic mood, full affect   EKG:  EKG is ordered today.NSR  76 bpm     Lipid Panel    Component Value Date/Time   CHOL 236* 06/17/2013 1029   TRIG 204* 06/17/2013 1029   HDL 41 06/17/2013 1029   CHOLHDL 5.8 06/17/2013 1029   VLDL 41* 06/17/2013 1029   LDLCALC 154* 06/17/2013 1029   LDLDIRECT 143* 06/01/2008 2123      Wt Readings from Last 3 Encounters:  08/24/14 122 lb 6.4 oz (55.52 kg)  07/03/14 121 lb (54.885 kg)  05/05/14 125 lb 3.2 oz (56.79 kg)      ASSESSMENT AND PLAN:  1.  CP  I am not convinced due to cardiac ischemia  Diffuse wheezes.  I  would recomm prednisone.  Continue inhalers.  I would refer to pulmonary   Pt does have atherosclerosis on CT I would like to see her back in early fall, hopefully when breathing better, to see how she feels    2  Dyslipidemia  She should be on a statin with CT findings  Again, will review in Sept  Mentioned briefly now     Signed, Anne Carnes, MD  08/24/2014 2:18 PM    [address], [place], [place]  [address] Phone: [phone]; Fax: [phone]

## 2014-08-26 ENCOUNTER — Encounter: Payer: Self-pay | Admitting: Internal Medicine

## 2014-08-26 ENCOUNTER — Ambulatory Visit (INDEPENDENT_AMBULATORY_CARE_PROVIDER_SITE_OTHER): Payer: Medicare Other | Admitting: Internal Medicine

## 2014-08-26 VITALS — BP 128/74 | HR 79 | Ht 59.0 in | Wt 121.8 lb

## 2014-08-26 DIAGNOSIS — J4551 Severe persistent asthma with (acute) exacerbation: Secondary | ICD-10-CM

## 2014-08-26 NOTE — Patient Instructions (Addendum)
Stop advair and start dulera 100  Take 2 puffs first thing in am and then another 2 puffs about 12 hours later.   Finish prednisone   Work on inhaler technique:  relax and gently blow all the way out then take a nice smooth deep breath back in, triggering the inhaler at same time you start breathing in.  Hold for up to 5 seconds if you can.  Rinse and gargle with water when done      Only use your albuterol (proair)  as a rescue medication to be used if you can't catch your breath by resting or doing a relaxed purse lip breathing pattern.  - The less you use it, the better it will work when you need it. - Ok to use up to 2 puffs  every 4 hours if you must but call for immediate appointment if use goes up over your usual need - Don't leave home without it !!  (think of it like the spare tire for your car)   Try prilosec otc 20mg   X 2 Take 30-60 min before first meal of the day and Pepcid ac (famotidine) 20 mg one @  bedtime until  Return   GERD (REFLUX)  is an extremely common cause of respiratory symptoms just like yours , many times with no obvious heartburn at all.    It can be treated with medication, but also with lifestyle changes including elevation of the head of your bed (ideally with 6 inch  bed blocks),  Smoking cessation, avoidance of late meals, excessive alcohol, and avoid fatty foods, chocolate, peppermint, colas, red wine, and acidic juices such as orange juice.  NO MINT OR MENTHOL PRODUCTS SO NO COUGH DROPS  USE SUGARLESS CANDY INSTEAD (Jolley ranchers or Stover's or Life Savers) or even ice chips will also do - the key is to swallow to prevent all throat clearing. NO OIL BASED VITAMINS - use powdered substitutes.    Please schedule a follow up office visit in 4 weeks, sooner if needed  Add needs repeat cbc with diff and allergy profile off prednisone    Detener e iniciar advair Dulera 100 Take 2 inhalaciones a primera hora de la maana y luego otros 2 soplos sobre 12 horas  despus.  prednisona acabado  El trabajo en la tcnica de inhalacin: relajarse y soplar suavemente todo el camino a continuacin, tomar una buena respiracin profunda suave de nuevo, provocando inhalador, al mismo tiempo que empieza a Advice worker. Mantenga la posicin durante unos 5 segundos si se puede. Enjuague con agua y hacer grgaras cuando se hace    Slo use su albuterol (ProAir) como medicacin de rescate que se utilizar si no se puede recuperar el aliento al descansar o hacer un patrn de respiracin del monedero de labios relajados. - Cuanto menos se use, mejor funcionar cuando lo necesite. - Ok para Risk manager un mximo de 2 inhalaciones cada 4 horas, pero si tiene que pedir la cita inmediata si el uso va a lo largo de su necesidad habitual - No salga de casa sin ella !! (Piense en ello como el neumtico de repuesto para su coche)  Trate de Prilosec OTC 20mg  X 2 Tome 30-60 minutos antes de la primera comida del da y Pepcid (famotidina) 20 mg una @ antes de acostarse hasta que aparezca Atrs  ERGE (reflujo) es una causa muy comn de sntomas respiratorios como la suya, muchas veces sin el ardor de Paramedic obvia en absoluto.  Puede ser tratada  con medicamentos, pero tambin con los cambios de estilo de vida incluyendo la elevacin de la cabecera de la cama (bloques de cama idealmente con 6 pulgadas), para dejar de fumar, evitar los alimentos por la noche, el exceso de alcohol, y [employer] alimentos grasos, chocolate, [place], las bebidas de cola , vino tinto y jugos cidos como el jugo de [place]. NO menta o mentol PRODUCTOS as que no hay pastillas para la tos Caramelos sin azcar Conservation officer, nature (o ganaderos Jolley Stover o de Life Savers) o incluso trozos de hielo har tambin - la clave es que tragar para evitar que todo el carraspeo. Ninguna vitamina A BASE DE ACEITE - utilizan substitutos en polvo.   Por favor, programar una visita de seguimiento en 4 semanas, antes si es  necesario   Suggest an Corporate investment banker for [place] feedback
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
## 2014-08-27 ENCOUNTER — Encounter: Payer: Self-pay | Admitting: Internal Medicine

## 2014-08-27 NOTE — Assessment & Plan Note (Addendum)
This is either asthma or severe uacs masquerading as asthma but either way the ddx is the same:  DDX of  difficult airways management all start with A and  include Adherence, Ace Inhibitors, Acid Reflux, Active Sinus Disease, Alpha 1 Antitripsin deficiency, Anxiety masquerading as Airways dz,  ABPA,  allergy(esp in young), Aspiration (esp in elderly), Adverse effects of meds,  Active smokers, A bunch of PE's (a small clot burden can't cause this syndrome unless there is already severe underlying pulm or vascular dz with poor reserve) plus two Bs  = Bronchiectasis and Beta blocker use..and one C= CHF  Adherence is always the initial "prime suspect" and is a multilayered concern that requires a "trust but verify" approach in every patient - starting with knowing how to use medications, especially inhalers, correctly, keeping up with refills and understanding the fundamental difference between maintenance and prns vs those medications only taken for a very short course and then stopped and not refilled.  The proper method of use, as well as anticipated side effects, of a metered-dose inhaler are discussed and demonstrated to the patient. Improved effectiveness after extensive coaching during this visit to a level of approximately  75% so try dulerain the lower dose so as not to aggravate uacs  ? Acid (or non-acid) GERD > always difficult to exclude as up to 75% of pts in some series report no assoc GI/ Heartburn symptoms> rec max (24h)  acid suppression and diet restrictions/ reviewed and instructions given in writing.   ? Adverse effect of advair > not the best choice anyway in a cougher.   ? Allergy > note Eos 0.3 on recent cbc with diff/ needs allergy testing on return / continue singulair for now though doubt it's helping much by hx   ? abpa > needs IgE on return    I had an extended discussion with the patient thru interpetern reviewing all relevant studies completed to date and  Lasting 51minutes    Each maintenance medication was reviewed in detail including most importantly the difference between maintenance and prns and under what circumstances the prns are to be triggered using an action plan format that is not reflected in the computer generated alphabetically organized AVS.    Please see instructions for details which were reviewed in writing and the patient given a copy highlighting the part that I personally wrote and discussed at today's ov.

## 2014-09-24 ENCOUNTER — Ambulatory Visit: Payer: Medicare Other | Admitting: Internal Medicine

## 2014-10-26 ENCOUNTER — Ambulatory Visit: Payer: Medicare Other | Admitting: Internal Medicine

## 2014-11-02 ENCOUNTER — Encounter: Payer: Self-pay | Admitting: Internal Medicine

## 2014-11-02 ENCOUNTER — Ambulatory Visit (INDEPENDENT_AMBULATORY_CARE_PROVIDER_SITE_OTHER): Payer: Medicare Other | Admitting: Internal Medicine

## 2014-11-02 ENCOUNTER — Other Ambulatory Visit (INDEPENDENT_AMBULATORY_CARE_PROVIDER_SITE_OTHER): Payer: Medicare Other

## 2014-11-02 DIAGNOSIS — J4551 Severe persistent asthma with (acute) exacerbation: Secondary | ICD-10-CM

## 2014-11-02 LAB — CBC WITH DIFFERENTIAL/PLATELET
BASOS ABS: 0 10*3/uL (ref 0.0–0.1)
BASOS PCT: 0.5 % (ref 0.0–3.0)
Eosinophils Absolute: 1.3 10*3/uL — ABNORMAL HIGH (ref 0.0–0.7)
Eosinophils Relative: 15.8 % — ABNORMAL HIGH (ref 0.0–5.0)
HCT: 43.1 % (ref 36.0–46.0)
Hemoglobin: 14.5 g/dL (ref 12.0–15.0)
LYMPHS ABS: 1.7 10*3/uL (ref 0.7–4.0)
Lymphocytes Relative: 20.2 % (ref 12.0–46.0)
MCHC: 33.6 g/dL (ref 30.0–36.0)
MCV: 87.1 fl (ref 78.0–100.0)
Monocytes Absolute: 0.6 10*3/uL (ref 0.1–1.0)
Monocytes Relative: 7.4 % (ref 3.0–12.0)
NEUTROS PCT: 56.1 % (ref 43.0–77.0)
Neutro Abs: 4.8 10*3/uL (ref 1.4–7.7)
PLATELETS: 351 10*3/uL (ref 150.0–400.0)
RBC: 4.95 Mil/uL (ref 3.87–5.11)
RDW: 13.1 % (ref 11.5–15.5)
WBC: 8.5 10*3/uL (ref 4.0–10.5)

## 2014-11-02 MED ORDER — PREDNISONE 10 MG PO TABS: ORAL_TABLET | ORAL | Status: DC

## 2014-11-02 MED ORDER — MONTELUKAST SODIUM 10 MG PO TABS: 10.0000 mg | ORAL_TABLET | Freq: Every day | ORAL | Status: DC

## 2014-11-02 MED ORDER — MOMETASONE FURO-FORMOTEROL FUM 100-5 MCG/ACT IN AERO: INHALATION_SPRAY | RESPIRATORY_TRACT | Status: DC

## 2014-11-02 MED ORDER — ALBUTEROL SULFATE HFA 108 (90 BASE) MCG/ACT IN AERS: 2.0000 | INHALATION_SPRAY | RESPIRATORY_TRACT | Status: DC | PRN

## 2014-11-02 MED ORDER — PANTOPRAZOLE SODIUM 40 MG PO TBEC: 40.0000 mg | DELAYED_RELEASE_TABLET | Freq: Every day | ORAL | Status: DC

## 2014-11-02 MED ORDER — FAMOTIDINE 20 MG PO TABS: ORAL_TABLET | ORAL | Status: DC
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
## 2014-11-02 NOTE — Patient Instructions (Addendum)
dulera 100 Take 2 puffs first thing in am and then another 2 puffs about 12 hours later.(your have enough samples to finish 4 weeks)  singulair (montelukast) 10 mg each pm  Only use your albuterol (proair)  as a rescue medication to be used if you can't catch your breath by resting or doing a relaxed purse lip breathing pattern.  - The less you use it, the better it will work when you need it. - Ok to use up to 2 puffs  every 4 hours if you must but call for immediate appointment if use goes up over your usual need - Don't leave home without it !!  (think of it like the spare tire for your car)   Pantoprazole (protonix) 40 mg   Take  30-60 min before first meal of the day and Pepcid (famotidine)  20 mg one @  bedtime until return to office - this is the best way to tell whether stomach acid is contributing to your problem.    GERD (REFLUX)  is an extremely common cause of respiratory symptoms just like yours , many times with no obvious heartburn at all.    It can be treated with medication, but also with lifestyle changes including elevation of the head of your bed (ideally with 6 inch  bed blocks),  Smoking cessation, avoidance of late meals, excessive alcohol, and avoid fatty foods, chocolate, peppermint, colas, red wine, and acidic juices such as orange juice.  NO MINT OR MENTHOL PRODUCTS SO NO COUGH DROPS  USE SUGARLESS CANDY INSTEAD (Jolley ranchers or Stover's or Life Savers) or even ice chips will also do - the key is to swallow to prevent all throat clearing. NO OIL BASED VITAMINS - use powdered substitutes.   Prednisone 10 mg take  4 each am x 2 days,   2 each am x 2 days,  1 each am x 2 days and stop   Please remember to go to the lab   department downstairs for your tests - we will call you with the results when they are available.     Please schedule a follow up office visit in 4 weeks, sooner if needed with pfts     Dulera 100 Tome 2 inhalaciones a primera hora de la maana y  luego otros 2 inhalaciones aproximadamente 12 horas ms tarde. (tu tiene suficientes muestras para terminar 4 semanas)  Singulair (montelukast) 10 mg cada pm  Slo use su albuterol (ProAir) como medicacin de rescate que se utilizar si no se puede recuperar el aliento al descansar o hacer un patrn de respiracin del monedero de labios relajados. - Cuanto menos se use, mejor funcionar cuando lo necesite. - Ok para Risk manager un mximo de 2 inhalaciones cada 4 horas si es necesario, pero llame para una cita inmediata en caso de utilizacin sube por encima de su necesidad habitual - No salga de casa sin ella !! ([place] en l como el neumtico de repuesto para su coche)  Pantoprazol (Protonix) 40 mg Tome 30-60 minutos antes de la primera comida del da y Pepcid (famotidina) 20 mg una @ hora de dormir hasta el regreso a la oficina - esta es la mejor manera de saber si el cido del estmago est contribuyendo a su problema.  ERGE (reflujo) es una causa muy comn de sntomas respiratorios como la suya, muchas veces sin el ardor de Paramedic obvia en absoluto.  Puede ser tratada con medicamentos, sino tambin con los cambios de [place]  incluyendo la elevacin de la cabecera de la cama (bloques de cama idealmente con 6 pulgadas), para dejar de fumar, evitar los alimentos por la noche, el exceso de alcohol, y [employer] alimentos grasos, chocolate, [place], las bebidas de cola , vino tinto y jugos cidos como el jugo de [place]. NO menta o mentol PRODUCTOS as que no hay pastillas para la tos  USO EN LUGAR dulce sin azcar (o ganaderos Jolley Stover o [place]) o incluso trozos de hielo tambin lo har - la clave es que tragar para evitar que todo el carraspeo. Ninguna vitamina A BASE DE ACEITE - utilizan substitutos en polvo.  Prednisona 10 mg toman cada 4 am x 2 das, 2 cada uno mencionadas X 2 das, 1 am cada x 2 das y [employer] favor, recuerde que ir a la sala de la planta baja de  laboratorio para las pruebas - nosotros le llamamos con los resultados cuando estn disponibles.   Por favor, programar una visita de seguimiento de oficinas en 4 semanas, antes si es necesario, con pruebas de funcin pulmonar

## 2014-11-03 LAB — ALLERGY FULL PROFILE
ALTERNARIA ALTERNATA: 0.15 kU/L — AB
Allergen, D pternoyssinus,d7: 0.54 kU/L — ABNORMAL HIGH
Allergen,Goose feathers, e70: <0.1 kU/L
BERMUDA GRASS: 0.63 kU/L — AB
Bahia Grass: 0.45 kU/L — ABNORMAL HIGH
Box Elder IgE: 1.08 kU/L — ABNORMAL HIGH
CURVULARIA LUNATA: 0.24 kU/L — AB
Candida Albicans: 0.2 kU/L — ABNORMAL HIGH
Common Ragweed: 3.84 kU/L — ABNORMAL HIGH
D. farinae: 0.56 kU/L — ABNORMAL HIGH
Dog Dander: <0.1 kU/L
Elm IgE: 0.11 kU/L — ABNORMAL HIGH
FESCUE: 0.46 kU/L — AB
G005 Rye, Perennial: 0.42 kU/L — ABNORMAL HIGH
G009 Red Top: 0.61 kU/L — ABNORMAL HIGH
Goldenrod: 1.77 kU/L — ABNORMAL HIGH
HELMINTHOSPORIUM HALODES: 0.25 kU/L — AB
House Dust Hollister: 0.18 kU/L — ABNORMAL HIGH
IgE (Immunoglobulin E), Serum: 707 kU/L — ABNORMAL HIGH (ref <115)
Lamb's Quarters: 0.15 kU/L — ABNORMAL HIGH
OAK CLASS: 2.18 kU/L — AB
PLANTAIN: 0.12 kU/L — AB
SYCAMORE TREE: 0.13 kU/L — AB
Stemphylium Botryosum: 0.29 kU/L — ABNORMAL HIGH
Timothy Grass: 0.48 kU/L — ABNORMAL HIGH

## 2014-11-04 NOTE — Progress Notes (Signed)
Quick Note:  LMTCB ______ 

## 2014-11-05 ENCOUNTER — Telehealth: Payer: Self-pay | Admitting: Internal Medicine

## 2014-11-05 NOTE — Telephone Encounter (Signed)
Notes Recorded by Rosana Berger, CMA on 11/05/2014 at 10:49 AM LMTCB Notes Recorded by Rosana Berger, CMA on 11/04/2014 at 11:54 AM LMTCB Notes Recorded by Tanda Rockers, MD on 11/03/2014 at 4:59 PM Call patient : Studies are c/w trees and grass allergies so should avoid these and we'll discuss other options in more detail at f/u ------------------------------ Pt's translator, Peter Congo is aware of the results. She will relay them to the pt.

## 2014-11-05 NOTE — Progress Notes (Signed)
Quick Note:  LMTCB ______ 

## 2014-11-09 NOTE — Assessment & Plan Note (Addendum)
-   08/26/2014  > try dulera 100 2 bid  - 11/02/2014  extensive coaching HFA effectiveness =    75% (late trigger, good insp time/effort) - Allergy profile 11/02/2014 > Eos 1.3,  IgE  707 POS RAST trees/grasses  - singulair added 11/02/2014   Clearly allergic asthma and that's why she is so steroid dep  I used  the analogy of putting steroid cream on a rash to help explain the meaning of topical therapy and the need to get the drug to the target tissue and spent extra time again on hfa instruction  I had an extended discussion with the patient reviewing all relevant studies completed to date and  lasting 15 to 20 minutes of a 25 minute visit    Each maintenance medication was reviewed in detail including most importantly the difference between maintenance and prns and under what circumstances the prns are to be triggered using an action plan format that is not reflected in the computer generated alphabetically organized AVS.    Please see instructions for details which were reviewed in writing(and translated into spanish) and the patient given a copy highlighting the part that I personally wrote and discussed at today's ov.

## 2014-12-11 ENCOUNTER — Ambulatory Visit (INDEPENDENT_AMBULATORY_CARE_PROVIDER_SITE_OTHER): Payer: Medicare Other | Admitting: Internal Medicine

## 2014-12-11 ENCOUNTER — Encounter: Payer: Self-pay | Admitting: Internal Medicine

## 2014-12-11 VITALS — BP 124/76 | HR 73 | Ht 60.5 in | Wt 123.0 lb

## 2014-12-11 DIAGNOSIS — J4531 Mild persistent asthma with (acute) exacerbation: Secondary | ICD-10-CM

## 2014-12-11 DIAGNOSIS — J4551 Severe persistent asthma with (acute) exacerbation: Secondary | ICD-10-CM

## 2014-12-11 DIAGNOSIS — K219 Gastro-esophageal reflux disease without esophagitis: Secondary | ICD-10-CM | POA: Diagnosis not present

## 2014-12-11 LAB — PULMONARY FUNCTION TEST
DL/VA % PRED: 132 %
DL/VA: 5.74 ml/min/mmHg/L
DLCO unc % pred: 115 %
DLCO unc: 22.66 ml/min/mmHg
FEF 25-75 POST: 1.66 L/s
FEF 25-75 Pre: 1.32 L/sec
FEF2575-%Change-Post: 25 %
FEF2575-%Pred-Post: 113 %
FEF2575-%Pred-Pre: 90 %
FEV1-%Change-Post: 4 %
FEV1-%PRED-PRE: 102 %
FEV1-%Pred-Post: 107 %
FEV1-PRE: 1.83 L
FEV1-Post: 1.92 L
FEV1FVC-%CHANGE-POST: 6 %
FEV1FVC-%Pred-Pre: 100 %
FEV6-%Change-Post: -1 %
FEV6-%Pred-Post: 105 %
FEV6-%Pred-Pre: 106 %
FEV6-PRE: 2.43 L
FEV6-Post: 2.39 L
FEV6FVC-%PRED-PRE: 105 %
FEV6FVC-%Pred-Post: 105 %
FVC-%CHANGE-POST: -1 %
FVC-%PRED-PRE: 101 %
FVC-%Pred-Post: 99 %
FVC-POST: 2.39 L
FVC-PRE: 2.43 L
PRE FEV1/FVC RATIO: 75 %
Post FEV1/FVC ratio: 80 %
Post FEV6/FVC ratio: 100 %
Pre FEV6/FVC Ratio: 100 %
RV % pred: 107 %
RV: 2.27 L
TLC % pred: 102 %
TLC: 4.65 L

## 2014-12-11 NOTE — Progress Notes (Signed)
PFT done today. 

## 2014-12-11 NOTE — Patient Instructions (Addendum)
Ok to stop the pantoprazole in am but continue the pepcid at bedtime - if cough or breathing worse add back the pantoprazole   Work on inhaler technique:  relax and gently blow all the way out then take a nice smooth deep breath back in, triggering the inhaler at same time you start breathing in.  Hold for up to 5 seconds if you can. Blow out thru nose. Rinse and gargle with water when done  No other changes for now  Let me know if Ruthe Mannan is too expensive and we can try something else   Please schedule a follow up visit in 3 months but call sooner if needed  --------   Ok para detener el pantoprazol en la maana, pero continuar el pepcid a la hora de acostarse - si la tos o la respiracin Air cabin crew de Gwendel Hanson el pantoprazol  Trabaje en la tcnica del inhalador: reljese y suavemente soplar todo el camino hacia fuera entonces toma una respiracin profunda lisa agradable detrs adentro, activando el inhalador en el mismo tiempo que usted comienza a Marketing executive. Sostngase por hasta 5 segundos si usted puede. Soplar a travs de Mudlogger. Enjuague y haga grgaras con agua cuando termine  No hay otros cambios por ahora  Hazme saber si Dulera es demasiado caro y podemos intentar algo ms  Por favor, programe una visita de seguimiento en 3 meses, pero llame antes si es necesario
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
## 2014-12-12 ENCOUNTER — Encounter: Payer: Self-pay | Admitting: Internal Medicine

## 2014-12-12 NOTE — Assessment & Plan Note (Signed)
Not clear this is playing any role in her asthma and if not having overt hb ok to try off and see if either GI or resp symptoms flare and if so restart

## 2014-12-12 NOTE — Assessment & Plan Note (Signed)
-  08/26/2014  > try dulera 100 2 bid  - 11/02/2014  extensive coaching HFA effectiveness =    75% (late trigger, good insp time/effort) - Allergy profile 11/02/2014 > Eos 1.3,  IgE  707 POS RAST trees/grasses  - singulair added 11/02/2014  - 12/11/2014  extensive coaching HFA effectiveness =    75% (still late trigger)  - 12/11/2014 pfts wnl including fef 25-75 p am dulera 100 x 2 puffs  I had an extended discussion with the patient via instructor  reviewing all relevant studies completed to date and  lasting 15 to 20 minutes of a 25 minute visit    All goals of chronic asthma control met including optimal function and elimination of symptoms with minimal need for rescue therapy.  Contingencies discussed in full including contacting this office immediately if not controlling the symptoms using the rule of two's.     Each maintenance medication was reviewed in detail including most importantly the difference between maintenance and prns and under what circumstances the prns are to be triggered using an action plan format that is not reflected in the computer generated alphabetically organized AVS.    Please see instructions for details which were reviewed in writing in Spanish version  and the patient given a copy highlighting the part that I personally wrote and discussed at today's ov.

## 2015-01-06 ENCOUNTER — Ambulatory Visit (INDEPENDENT_AMBULATORY_CARE_PROVIDER_SITE_OTHER): Payer: Medicare Other | Admitting: *Deleted

## 2015-01-06 DIAGNOSIS — Z23 Encounter for immunization: Secondary | ICD-10-CM | POA: Diagnosis not present

## 2015-03-15 ENCOUNTER — Encounter: Payer: Self-pay | Admitting: Internal Medicine

## 2015-03-15 ENCOUNTER — Ambulatory Visit (INDEPENDENT_AMBULATORY_CARE_PROVIDER_SITE_OTHER): Payer: Medicare Other | Admitting: Internal Medicine

## 2015-03-15 VITALS — BP 122/74 | HR 87 | Ht 59.0 in | Wt 127.0 lb

## 2015-03-15 DIAGNOSIS — J4531 Mild persistent asthma with (acute) exacerbation: Secondary | ICD-10-CM

## 2015-03-15 MED ORDER — MONTELUKAST SODIUM 10 MG PO TABS: 10.0000 mg | ORAL_TABLET | Freq: Every day | ORAL | Status: DC

## 2015-03-15 MED ORDER — PREDNISONE 10 MG PO TABS
ORAL_TABLET | ORAL | Status: DC
Start: 2015-03-15 — End: 2015-06-22

## 2015-03-15 MED ORDER — BUDESONIDE-FORMOTEROL FUMARATE 80-4.5 MCG/ACT IN AERO: 2.0000 | INHALATION_SPRAY | Freq: Two times a day (BID) | RESPIRATORY_TRACT | Status: DC

## 2015-03-15 NOTE — Assessment & Plan Note (Signed)
-   08/26/2014  > try dulera 100 2 bid  - 11/02/2014  extensive coaching HFA effectiveness =    75% (late trigger, good insp time/effort) - Allergy profile 11/02/2014 > Eos 1.3,  IgE  707 POS RAST trees/grasses  - singulair added 11/02/2014  - 12/11/2014  extensive coaching HFA effectiveness =    75% (still late trigger)  - 12/11/2014 pfts wnl including fef 25-75 p am dulera 100 x 2 puffs    Symptoms are difficult to control since stopping her meds.  DDX of  difficult airways management almost all start with A and  include Adherence, Ace Inhibitors, Acid Reflux, Active Sinus Disease, Alpha 1 Antitripsin deficiency, Anxiety masquerading as Airways dz,  ABPA,  Allergy(esp in young), Aspiration (esp in elderly), Adverse effects of meds,  Active smokers, A bunch of PE's (a small clot burden can't cause this syndrome unless there is already severe underlying pulm or vascular dz with poor reserve) plus two Bs  = Bronchiectasis and Beta blocker use..and one C= CHF  Adherence is always the initial "prime suspect" and is a multilayered concern that requires a "trust but verify" approach in every patient - starting with knowing how to use medications, especially inhalers, correctly, keeping up with refills and understanding the fundamental difference between maintenance and prns vs those medications only taken for a very short course and then stopped and not refilled.  - - The proper method of use, as well as anticipated side effects, of a metered-dose inhaler are discussed and demonstrated to the patient. Improved effectiveness after extensive coaching during this visit to a level of approximately 90 % from a baseline of 75 %  - Formulary restrictions will be an ongoing challenge for the forseable future and I would be happy to pick an alternative if the pt will first  provide me a list of them but pt  will need to return here for training for any new device that is required eg dpi vs hfa vs respimat.   In meantime we  can always provide samples so the patient never runs out of any needed respiratory medications.    ? Acid (or non-acid) GERD > always difficult to exclude as up to 75% of pts in some series report no assoc GI/ Heartburn symptoms> rec max (24h)  acid suppression and diet restrictions/ reviewed and instructions given in writing.   ? Allergy > continue singulair, consider higher dose of ICS if symptoms not well controlled/ check NO next ov while maintained on the symbicort 80 2bid or insurance equivalent    I had an extended discussion with the patient thru interpreter  reviewing all relevant studies completed to date and  lasting 15 to 20 minutes of a 25 minute visit    Each maintenance medication was reviewed in detail including most importantly the difference between maintenance and prns and under what circumstances the prns are to be triggered using an action plan format that is not reflected in the computer generated alphabetically organized AVS.    Please see instructions for details which were reviewed in writing and the patient given a copy highlighting the part that I personally wrote and discussed at today's ov.
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
## 2015-03-15 NOTE — Patient Instructions (Addendum)
Prednisone 10 mg take  4 each am x 2 days,   2 each am x 2 days,  1 each am x 2 days and stop  Symbicort 80 Take 2 puffs first thing in am and then another 2 puffs about 12 hours later.  Resume singulair (montelukast) 10 mg each pm  Resume pepcid 20 mg after bfast and at bedtime   Only use your albuterol as a rescue medication to be used if you can't catch your breath by resting or doing a relaxed purse lip breathing pattern.  - The less you use it, the better it will work when you need it. - Ok to use up to 2 puffs  every 4 hours if you must but call for immediate appointment if use goes up over your usual need - Don't leave home without it !!  (think of it like the spare tire for your car)   Please schedule a follow up visit in 3 months but call sooner if needed.  Prednisona 10 mg tomar 4 cada am x 2 das, 2 cada am x 2 das, 1 cada am x 2 das y [employer] 80 Tome 2 bocanadas primera cosa en la maana y luego otra 2 bocanadas aproximadamente 12 horas ms tarde. Reanudar singulair (montelukast) 10 mg cada pm Retirar 20 mg de pepcid despus de la comida rpida y al acostarse  Utilice solamente su albuterol como una medicacin del rescate a ser utilizado si usted no puede coger su respiracin descansando o haciendo un patrn de respiracin relajado del labio del monedero. - Cuanto menos lo utilice, mejor funcionar cuando lo necesite. - Ok para usar hasta 2 bocanadas cada 4 horas si es necesario, pero llamar para una cita inmediata si el uso sube por encima de su necesidad habitual - No salgas de casa sin ella! (Piense en l como la rueda de repuesto para su coche)  Por favor, programe una visita de seguimiento en 3 meses, pero llame antes si es necesario.

## 2015-03-18 ENCOUNTER — Encounter: Payer: Self-pay | Admitting: Internal Medicine

## 2015-03-19 ENCOUNTER — Ambulatory Visit (INDEPENDENT_AMBULATORY_CARE_PROVIDER_SITE_OTHER): Payer: Medicare Other | Admitting: Family Medicine

## 2015-03-19 ENCOUNTER — Other Ambulatory Visit: Payer: Self-pay | Admitting: Internal Medicine

## 2015-03-19 ENCOUNTER — Encounter: Payer: Self-pay | Admitting: Family Medicine

## 2015-03-19 VITALS — BP 149/71 | HR 68 | Temp 97.7°F | Ht 59.0 in | Wt 125.1 lb

## 2015-03-19 DIAGNOSIS — Z0000 Encounter for general adult medical examination without abnormal findings: Secondary | ICD-10-CM | POA: Diagnosis not present

## 2015-03-19 DIAGNOSIS — E2839 Other primary ovarian failure: Secondary | ICD-10-CM | POA: Diagnosis not present

## 2015-03-19 DIAGNOSIS — K219 Gastro-esophageal reflux disease without esophagitis: Secondary | ICD-10-CM

## 2015-03-19 DIAGNOSIS — I10 Essential (primary) hypertension: Secondary | ICD-10-CM | POA: Diagnosis not present

## 2015-03-19 DIAGNOSIS — R42 Dizziness and giddiness: Secondary | ICD-10-CM | POA: Insufficient documentation

## 2015-03-19 LAB — CBC
HEMATOCRIT: 46.4 % — AB (ref 36.0–46.0)
Hemoglobin: 16.1 g/dL — ABNORMAL HIGH (ref 12.0–15.0)
MCH: 29.9 pg (ref 26.0–34.0)
MCHC: 34.7 g/dL (ref 30.0–36.0)
MCV: 86.2 fL (ref 78.0–100.0)
MPV: 10.9 fL (ref 8.6–12.4)
Platelets: 263 10*3/uL (ref 150–400)
RBC: 5.38 MIL/uL — ABNORMAL HIGH (ref 3.87–5.11)
RDW: 13.2 % (ref 11.5–15.5)
WBC: 7.9 10*3/uL (ref 4.0–10.5)

## 2015-03-19 LAB — BASIC METABOLIC PANEL WITH GFR
BUN: 18 mg/dL (ref 7–25)
CALCIUM: 9.7 mg/dL (ref 8.6–10.4)
CO2: 27 mmol/L (ref 20–31)
CREATININE: 0.75 mg/dL (ref 0.60–0.93)
Chloride: 103 mmol/L (ref 98–110)
GFR, Est African American: >89 mL/min (ref >=60)
GFR, Est Non African American: 78 mL/min (ref >=60)
Glucose, Bld: 82 mg/dL (ref 65–99)
Potassium: 4.2 mmol/L (ref 3.5–5.3)
SODIUM: 140 mmol/L (ref 135–146)

## 2015-03-19 LAB — LIPID PANEL
CHOL/HDL RATIO: 5.1 ratio — AB (ref <=5.0)
Cholesterol: 248 mg/dL — ABNORMAL HIGH (ref 125–200)
HDL: 49 mg/dL (ref >=46)
LDL CALC: 156 mg/dL — AB (ref <130)
TRIGLYCERIDES: 215 mg/dL — AB (ref <150)
VLDL: 43 mg/dL — AB (ref <30)

## 2015-03-19 LAB — TSH: TSH: 1.66 mIU/L

## 2015-03-19 MED ORDER — OMEPRAZOLE 40 MG PO CPDR: 40.0000 mg | DELAYED_RELEASE_CAPSULE | Freq: Every day | ORAL | Status: DC

## 2015-03-19 NOTE — Assessment & Plan Note (Addendum)
75 y/o female presents for annual well women/preventative visit. -Due for Shingles/Tdap/13-valent PNA (she will get shingles and Tdap in [place] due to lower cost). 13-valent offered (not given as forgotten amongst multiple other issues) -Due for mammogram (phone number to [facility] imaging provided) -Not a candidate for pap smear (over the age of 47) -Repeat DEXA scheduled to evaluate for osteoporosis -Encouraged continued healthy eating habits -Routine labs obtained

## 2015-03-19 NOTE — Patient Instructions (Addendum)
It was nice to see you today.  Vaccines - please get your Shingles and Tetanus shots completed in [place]  Mammogram - please call to schedule you mammogram  Bone Density - my nurse will schedule a bone density test for you.   Asthma/trouble breathing - please continue to follow up with Dr. Melvyn Novas  Dr. Ree Kida will call you with your lab results.   Diverticulitis: Please call Dr. Laurence Spates to schedule appointment. His clinic number is [phone]. If you need a referral please call my office and I will be happy to place the order.   I think that you dizziness is due to Orthostatic Hypotension. Please see the overview below.  Abdominal pain - I think this is due to acid reflux. Stop Famotidine. Start Omeprazole 40 mg daily.   Please return if your back continues to give you problems.   Hipotensin ortosttica (Orthostatic Hypotension) La hipotensin ortosttica es la cada sbita de la presin arterial. que sucede al ponerse de pie rpidamente despus de haber estado sentado o acostado. Puede sentirse mareado o aturdido. Esto puede durar unos pocos segundos o unos minutos. Generalmente, no es un problema grave. Sin embargo, si sucede con frecuencia o empeora, puede ser un signo de algo ms grave. [place] cosas que pueden causar hipotensin ortosttica, se incluyen:   Prdida de lquidos corporales (deshidratacin).  Medicamentos que disminuyen la presin arterial.  Cambios repentinos en la postura, por ejemplo, ponerse de pie rpidamente despus de haber estado sentado o acostado.  Consumo excesivo de medicamentos. [place].  Desmayos o [employer].  Frecuencia cardaca acelerada.  Debilidad.  Sensacin de cansancio (fatiga). DIAGNSTICO  El mdico puede hacer diversas cosas para ayudar a Midwife y a Investment banker, corporate. Estas pueden incluir:   Preguntar los antecedentes mdicos y Field seismologist un examen  fsico.  Controlar la presin arterial. El mdico le controlar la presin arterial cuando usted est:  Acostado.  Sentado.  De pie.  Acostado en una mesa basculante. En esta prueba, se acostar en una mesa que pasa de una posicin horizontal a una posicin vertical. Lo sujetarn a la mesa. Esta prueba mide la presin arterial y la frecuencia cardaca en diferentes posiciones. TRATAMIENTO  El tratamiento depender de la causa. Los tratamientos posibles incluyen lo siguiente:   Sales promotion account executive de los medicamentos.  Usar medias de compresin en la parte inferior de las piernas.  Ponerse de pie lentamente despus de haber estado sentado o acostado.  Consumir ms sal.  Hacer comidas pequeas y frecuentes.  En algunos casos, recibir lquido por va intravenosa (IV).  Tomar medicamentos para mejorar la retencin de lquido. [place] los medicamentos de venta libre o recetados solamente segn las indicaciones del [place] instrucciones del mdico para modificar la dosificacin de sus medicamentos actuales.  No deje de tomar los medicamentos ni modifique la dosis por su cuenta.  Pngase de pie lentamente despus de haber estado sentado o acostado. Esto permite que el cuerpo se adapte a Holiday representative.  Use las medias de compresin segn las indicaciones.  Consuma la sal adicional segn las indicaciones.  No agregue sal adicional a su dieta si no se lo indica el mdico.  Haga comidas pequeas y frecuentes.  Evite ponerse de pie de repente despus de comer.  Evite las duchas calientes o el calor excesivo segn las indicaciones del mdico.  [place]  con todas las visitas de control. SOLICITE ATENCIN MDICA SI:  Sigue sintindose mareado o aturdido despus de haberse puesto de pie.  Se siente atontado o confundido.  Tiene fro, sudoracin fra o Higher education careers adviser (nuseas).  Tiene visin borrosa.  Le  falta el aire. SOLICITE ATENCIN MDICA DE INMEDIATO SI:   Se desmaya despus de ponerse de pie.  Siente dolor en el pecho.  Tiene dificultad para respirar.  Pierde la sensibilidad o el movimiento en los brazos o en las piernas.  Tiene problemas para articular las palabras o dificultad para hablar, o no puede hablar. ASEGRESE DE QUE:   Comprende estas instrucciones.  Controlar su afeccin.  Recibir ayuda de inmediato si no mejora o si empeora.   Esta informacin no tiene Marine scientist el consejo del mdico. Asegrese de hacerle al mdico cualquier pregunta que tenga.   Document Released: 10/26/2004 Document Revised: 01/21/2013 Elsevier Interactive Patient Education [employer].

## 2015-03-19 NOTE — Progress Notes (Signed)
75 y.o. year old female presents for well woman/preventative visit and annual GYN examination.  Acute Concerns: Dizziness - Patient reports dizziness immediately after rising, and the feeling lasts seconds to minutes before going away. When turning her head, she occasionally has a similar sensation but it is much more mild. She recently has been having increased sputum production from her asthma and believes that it is affecting her ears. She has mild tinnitus in her left ear. She is not on any hypertension medications. Tinnitus and some hearing loss reported in left ear.   Asthma - Patient sees Dr. Melvyn Novas for asthma. She is finishing up a course of Prednisone but still having symptoms including cough, sputum production, and shortness of breath. Compliant with Symbicort, Singulair, and Albuterol.   Abdominal Pain - She reports occasional pain in the right upper and lower quadrants. It is associated with some nausea and reflux, and she denies vomiting, diarrhea, blood in stools, or constipation. She has a history of diverticulitis and GERD and is currently on famotidine. Her gallbladder has been surgically removed.   Diet: Patient is eating the same foods that she has in the past. She reports eating fruits and vegetables daily.  Exercise: Patient has been unable to exercise due to her asthma symptoms.  Sexual/Birth History: Patient denies any pelvic pains or abnormal vaginal bleeding.  POA/Living Will: Jannett Celestine (sister), copy in chart  Immunizations: Patient is interested in getting the pneumococcal vaccine in clinic today. She will go to [place] to get the tetanus and shingles vaccine since it is not covered by her insurance here.  Screening: Patient is interested in getting a mammogram and DEXA scan.  Social:  Social History   Social History  . Marital Status: Married    Spouse Name: Glenard Haring  . Number of Children: 1  . Years of Education: 8   Occupational History  . Retired-  Education administrator houses    Social History Main Topics  . Smoking status: Former Smoker -- 0.10 packs/day    Types: Cigarettes    Quit date: 01/30/1978  . Smokeless tobacco: Never Used     Comment: 1 cig daily <1 year 12/11/14  . Alcohol Use: No  . Drug Use: No  . Sexual Activity: Yes   Other Topics Concern  . None   Social History Narrative   Native of [place]   Married to [place], native of [place], visits children of his first marriage there.   They have a daughter, who is a Chief Executive Officer in [place] area   Religion, [place]: Primary: sister Jannett Celestine [phone] Secondary: Broadus John [phone]   Emergency Contact: Husband Claudie Fisherman [phone]   End of Life Plan: DNR     Who lives with you: Lives in 1 story home with husband.   Any pets: 0   Diet: Patient has a varied diet of protein, starch, and vegetables.  Patient tries not to eat red meat.    Exercise: Patient walks 2-3 times per week.  Pt has had problems with her asthma while exercising.    Seatbelts: Patient reports wearing seat belts when in vehicle.    Nancy Fetter Exposure/Protection: Patient reports wearing sun protection.   Hobbies: Likes to read                Immunization: Immunization History  Administered Date(s) Administered  . Influenza Split 12/13/2011  . Influenza Whole 11/27/2006, 11/14/2007, 11/09/2008, 11/01/2009  . Influenza,inj,Quad PF,36+ Mos 10/22/2012, 10/20/2013,  01/06/2015  . Pneumococcal Polysaccharide-23 11/14/2007  . Td 11/30/2004    Cancer Screening:  Pap Smear: Not a candidate (over the age of 45)  Mammogram: last in 07/2013  Colonoscopy: last in 2010 (not able to view in [place])  Dexa:last in 2006 (no evidence of osteopenia)   Physical Exam: VITALS: Reviewed, orthostatic vitals showed laying down BP 173/83 HR 64, sitting BP 155/80 HR 66, standing BP 149/85 HR 70 GEN: Pleasant female, NAD HEENT: Normocephalic, PERRL, EOMI, no scleral icterus,  CARDIAC:RRR,  S1 and S2 present, no murmur, no heaves/thrills RESP: CTAB, normal effort ABD: normal bowel sounds, tenderness epigastric and bilateral upper quadrants, scar RUQ from previous cholecystectomy SKIN: no rash Ext: no edema Neuro: no nystagmus on Dix-Hallpike test, CN 2-12 intact, sensation grossly intact, strength grossly 5/5 in all extremities Breast/GYN exam deferred due to no patient complaints.   Hearing screen: [place] all frequencies at 40 decibels bilaterally  ASSESSMENT & PLAN: 75 y.o. female presents for annual well woman/preventative exam. Please see problem specific assessment and plan.   Asthma - Patient should continue following up with Dr. Melvyn Novas.   Preventative health care 75 y/o female presents for annual well women/preventative visit. -Due for Shingles/Tdap/13-valent PNA (she will get shingles and Tdap in [place] due to lower cost). 13-valent offered (not given as forgotten amongst multiple other issues) -Due for mammogram (phone number to [facility] imaging provided) -Not a candidate for pap smear (over the age of 97) -Repeat DEXA scheduled to evaluate for osteoporosis -Encouraged continued healthy eating habits -Routine labs obtained  GERD Patient reports abdominal pain that is most consistent with GERD. -start Omeprazole -encouraged follow up with GI physicians(history of Diverticulitis and did not receive Colonoscopy at time of diagnosis)  Dizziness Orthostatic hypotension is most likely diagnosis based on orthostatic vitals. Benign paroxysmal positional vertigo is unlikely since there was no nystagmus on the Dix-Hallpike maneuver. We will check CBC to rule out anemia and observe for now.

## 2015-03-19 NOTE — Assessment & Plan Note (Signed)
Orthostatic hypotension is most likely diagnosis based on orthostatic vitals. Benign paroxysmal positional vertigo is unlikely since there was no nystagmus on the Dix-Hallpike maneuver. We will check CBC to rule out anemia and observe for now.

## 2015-03-19 NOTE — Assessment & Plan Note (Signed)
Patient reports abdominal pain that is most consistent with GERD. -start Omeprazole -encouraged follow up with GI physicians(history of Diverticulitis and did not receive Colonoscopy at time of diagnosis)

## 2015-03-22 ENCOUNTER — Telehealth: Payer: Self-pay | Admitting: Family Medicine

## 2015-03-22 DIAGNOSIS — D751 Secondary polycythemia: Secondary | ICD-10-CM | POA: Insufficient documentation

## 2015-03-22 DIAGNOSIS — E785 Hyperlipidemia, unspecified: Secondary | ICD-10-CM

## 2015-03-22 MED ORDER — LOVASTATIN 40 MG PO TABS: 40.0000 mg | ORAL_TABLET | Freq: Every day | ORAL | Status: DC

## 2015-03-22 MED ORDER — ASPIRIN 81 MG PO TABS: 81.0000 mg | ORAL_TABLET | Freq: Every day | ORAL | Status: DC

## 2015-03-22 NOTE — Telephone Encounter (Signed)
Discussed recent lab results. Cholesterol elevated and ASCVD above 20%, recommended STATIN, previous intolerance (?memory loss to statins), patient willing to try generic, would not be willing to take Lipitor (previous reaction) or Crestor (due to cost), will start lovastatin 40 mg daily. Also recommended asa 81 mg daily. Hemoglobin also elevated likely due to polycythemia from chronic asthma.

## 2015-03-22 NOTE — Assessment & Plan Note (Signed)
Started lovastatin 40 mg daily (see telephone note for discussion on risks/benefits/side effects with patient).

## 2015-03-22 NOTE — Assessment & Plan Note (Signed)
Elevated hemoglobin (likely due to chronic dyspnea from asthma) -encouraged continued follow up with pulmonology

## 2015-04-07 ENCOUNTER — Inpatient Hospital Stay: Admission: RE | Admit: 2015-04-07 | Payer: Medicare Other | Source: Ambulatory Visit

## 2015-04-14 ENCOUNTER — Encounter: Payer: Self-pay | Admitting: Family Medicine

## 2015-04-14 ENCOUNTER — Ambulatory Visit
Admission: RE | Admit: 2015-04-14 | Discharge: 2015-04-14 | Disposition: A | Discharge status: Home / Self Care | Payer: Medicare Other | Source: Ambulatory Visit | Attending: Family Medicine | Admitting: Family Medicine

## 2015-04-14 DIAGNOSIS — Z780 Asymptomatic menopausal state: Secondary | ICD-10-CM | POA: Diagnosis not present

## 2015-04-14 DIAGNOSIS — E2839 Other primary ovarian failure: Secondary | ICD-10-CM

## 2015-06-03 ENCOUNTER — Telehealth: Payer: Self-pay | Admitting: Internal Medicine

## 2015-06-03 NOTE — Telephone Encounter (Signed)
Attempted to contact patient, line busy. Patient needs to contact her pharmacy.  These medications have 11 refills on them, she should be able to get them refilled at [facility] without any trouble. Will call back.

## 2015-06-04 NOTE — Telephone Encounter (Signed)
Attempted to contact pt. Received a message, "Your call can't be completed as dialed." This is the only number we have listed for the pt. Will try back later.

## 2015-06-08 NOTE — Telephone Encounter (Signed)
Attempted to call pt. Received a message, "Your call can't be completed as dialed." This is the only number we have listed for the pt. Will try back.

## 2015-06-09 NOTE — Telephone Encounter (Signed)
Still unable to reach the pt  Will close encounter

## 2015-06-14 ENCOUNTER — Ambulatory Visit: Payer: Medicare Other | Admitting: Internal Medicine

## 2015-06-22 ENCOUNTER — Encounter: Payer: Self-pay | Admitting: Internal Medicine

## 2015-06-22 ENCOUNTER — Ambulatory Visit (INDEPENDENT_AMBULATORY_CARE_PROVIDER_SITE_OTHER): Payer: Medicare Other | Admitting: Internal Medicine

## 2015-06-22 VITALS — BP 140/80 | HR 93 | Ht 60.0 in | Wt 120.2 lb

## 2015-06-22 DIAGNOSIS — J4531 Mild persistent asthma with (acute) exacerbation: Secondary | ICD-10-CM

## 2015-06-22 MED ORDER — PREDNISONE 10 MG PO TABS: ORAL_TABLET | ORAL | Status: DC

## 2015-06-22 NOTE — Patient Instructions (Addendum)
Plan A = Automatic = Symicort 80 Take 2 puffs first thing in am and then another 2 puffs about 12 hours later.      Plan B = Backup Only use your albuterol (proair) as a rescue medication to be used if you can't catch your breath by resting or doing a relaxed purse lip breathing pattern.  - The less you use it, the better it will work when you need it. - Ok to use the inhaler up to 2 puffs  every 4 hours if you must but call for appointment if use goes up over your usual need - Don't leave home without it !!  (think of it like the spare tire for your car)     Prednisone 10 mg take  4 each am x 2 days,   2 each am x 2 days,  1 each am x 2 days and stop   If not doing great I would like to send you to an asthma specialist who speaks spanish = Dr Shaune Leeks   Please schedule a follow up visit in 3 months but call sooner if needed   Plan A = Automtico = Symicort 80 Tome 2 bocanadas la primera cosa en la maana y luego otra 2 bocanadas aproximadamente 12 horas ms tarde.    Plan B = Copia de seguridad Utilice solamente su albuterol (proair) como una medicacin del rescate a ser utilizado si usted no puede coger su respiracin descansando o haciendo un patrn de respiracin relajado del labio del monedero. - Cuanto menos lo utilice, mejor funcionar cuando lo necesite. - Est bien usar Chartered loss adjuster 2 inhalaciones cada 4 horas si debe, pero llame para una cita si el uso sube por encima de su necesidad habitual - No salgas de casa sin ella! (Piense en l como la rueda de repuesto para su coche)   Prednisona 10 mg tomar 4 cada am x 2 das, 2 cada am x 2 das, 1 cada am x 2 das y Scientist, water quality  Si no te va muy bien me gustara enviarte a un especialista en asma que hable espaol = Dr. Shaune Leeks   Por favor, programe una visita de seguimiento en 3 meses, pero llame antes si es necesario
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
## 2015-06-25 NOTE — Assessment & Plan Note (Signed)
-   08/26/2014  > try dulera 100 2 bid  - 11/02/2014  extensive coaching HFA effectiveness =    75% (late trigger, good insp time/effort) - Allergy profile 11/02/2014 > Eos 1.3,  IgE  707 POS RAST trees/grasses  - singulair added 11/02/2014  - 12/11/2014  extensive coaching HFA effectiveness =    75% (still late trigger)  - 12/11/2014 pfts wnl including fef 25-75 p am dulera 100 x 2 puffs - 06/22/2015  extensive coaching HFA effectiveness =    90%   I had an extended discussion with the patient/husband thru interpretor reviewing all relevant studies completed to date and  lasting 25 minutes of a 40  minute extended f/u ov visit    The reason she does poorly off of prednisone = In this case Adherence is the biggest issue and starts with  inability to use HFA effectively and also  understand that SABA treats the symptoms but doesn't get to the underlying problem (inflammation).  I used  the analogy of putting steroid cream on a rash to help explain the meaning of topical therapy and the need to get the drug to the target tissue.    Pharmacy issues addressed, we called to be sure her meds are there and refillable  I think she should consider seeing a spanish speaking doctor, either [place] or [place]  Instructions converted to [place] again so she can review them at home   Each maintenance medication was reviewed in detail including most importantly the difference between maintenance and prns and under what circumstances the prns are to be triggered using an action plan format that is not reflected in the computer generated alphabetically organized AVS.    Please see instructions for details which were reviewed in writing and the patient given a copy in spanish  highlighting the part that I personally wrote and discussed at today's ov.

## 2015-07-16 ENCOUNTER — Ambulatory Visit (HOSPITAL_COMMUNITY)
Admission: RE | Admit: 2015-07-16 | Discharge: 2015-07-16 | Disposition: A | Discharge status: Home / Self Care | Payer: Medicare Other | Source: Ambulatory Visit | Attending: Family Medicine | Admitting: Family Medicine

## 2015-07-16 ENCOUNTER — Ambulatory Visit (INDEPENDENT_AMBULATORY_CARE_PROVIDER_SITE_OTHER): Payer: Medicare Other | Admitting: Family Medicine

## 2015-07-16 ENCOUNTER — Encounter: Payer: Self-pay | Admitting: Family Medicine

## 2015-07-16 VITALS — BP 124/75 | HR 82 | Temp 98.2°F | Ht 60.0 in | Wt 117.6 lb

## 2015-07-16 DIAGNOSIS — J40 Bronchitis, not specified as acute or chronic: Secondary | ICD-10-CM | POA: Diagnosis not present

## 2015-07-16 DIAGNOSIS — R079 Chest pain, unspecified: Secondary | ICD-10-CM | POA: Insufficient documentation

## 2015-07-16 DIAGNOSIS — J45901 Unspecified asthma with (acute) exacerbation: Secondary | ICD-10-CM | POA: Diagnosis not present

## 2015-07-16 MED ORDER — ALBUTEROL SULFATE (2.5 MG/3ML) 0.083% IN NEBU
2.5000 mg | INHALATION_SOLUTION | Freq: Once | RESPIRATORY_TRACT | Status: AC
  Administered 2015-07-16: 2.5 mg via RESPIRATORY_TRACT

## 2015-07-16 MED ORDER — PREDNISONE 50 MG PO TABS: 50.0000 mg | ORAL_TABLET | Freq: Every day | ORAL | Status: DC

## 2015-07-16 MED ORDER — IPRATROPIUM BROMIDE 0.02 % IN SOLN
0.5000 mg | Freq: Once | RESPIRATORY_TRACT | Status: AC
  Administered 2015-07-16: 0.5 mg via RESPIRATORY_TRACT

## 2015-07-16 NOTE — Patient Instructions (Signed)
Albuterol 2 puffs every 4 hours as needed Prednisone 84m daily, start this today for a total of 5 days  Come back next week if not getting better If worse this weekend go to ER  Be well, Dr. MArdelia Mems   Asma, broncoespasmo agudo (Asthma, Acute Bronchospasm) El broncoespasmo agudo causado por el asma tambin se conoce como crisis de asma. Broncoespasmo significa que las vas respiratorias se han estrechado. La causa del estrechamiento es la inflamacin y la constriccin de los msculos de las vas respiratorias (bronquios) que se encuentran en los pulmones. Esto puede dificultar la respiracin o provocarle sibilancias y tos. CKerrtowndesencadenantes posibles son:  La caspa que eliminan los animales de la piel, el pelo o las plumas de lTrinidad  Los caros que se encuentran en el polvo de la casa.  [place].  El polen de los rboles o el csped.  Moho.  El humo del cigarrillo o del tabaco  Sustancias contaminantes como el polvo, limpiadores hogareos, aerosoles (como los aHasson Heightspara el cabello), vapores de pintura, sustancias qumicas fuertes u olores intensos.  El aire fro o cambios climticos. El aire fro puede causar inflamacin. El viento aumenta la cantidad de moho y polen del aire.  Emociones fuertes, cEngineer, productiono rer intensamente.  Estrs.  Ciertos medicamentos como la aspirina o betabloqueantes.  Los sulfitos que se encuentran en las comidas y bebidas como frutas secas y el vino.  Enfermedades infecciosas o inflamatorias, como la gripe, el resfro o la inflamacin de las membranas nasales (rinitis).  El reflujo gastroesofgico (ERGE). El reflujo gastroesofgico es una afeccin en la que los cidos estomacales vuelven al esfago.  Los ejercicios o actividades extenuantes. SKnoxville  Tos intensa, especialmente por la noche.  Opresin en el pecho.  Falta de aire. DIAGNSTICO  El mdico le har una historia clnica y le  har un examen fsico. LMarin Commentindicarn radiografas o anlisis de sangre para buscar otras causas de los sntomas u otras enfermedades que puedan desencadenar una crisis de asma.  TRATAMIENTO  El tratamiento est dirigido a reducir la inflamacin y aTalmovas respiratorias en los pulmones. La mayor parte de las crisis asmticas se tratan con medicamentos por va inhalatoria. Entre ellos se incluyen los medicamentos de alivio rpido o medicamentos de rescate (como los broncodilatadores) y los medicamentos de control (como los corticoides inhalados). Estos medicamentos se administran a travs de uEducational psychologisto de un nebulizador. Los corticoides sistmicos por va oral o por va intravenosa tambin se administran para reducir la inflamacin cuando un ataque es moderado o grave. Los antibiticos se indican solo si hay infeccin bacteriana.  INSTRUCCIONES PARA EL CUIDADO EN EL HOGAR   Reposo.  Beba lquido en abundancia. Esto ayuda a diluir la mucosidad y a eTransport planner Solo consuma productos con cafena moderadamente y no consuma alcohol hasta que se haya recuperado de la enfermedad.  No fume. Evite la exposicin al humo de otros fumadores.  Usted tiene un rol fundamental en mantener su buena salud. Evite la exposicin a lo que lRite Aidrespiratorios.  Mantenga los medicamentos actualizados y al alcance. Siga cuidadosamente el plan de tratamiento del mdico.  Utilice los medicamentos tal como se le indic.  Cuando haya mucho polen o polucin, mantenga las ventanas cerradas y use el aire acondicionado o vaya a lugares con aire acondicionado.  El asma requiere atencin mNamibia Concurra a los controles segn las indicaciones. Si tiene uNutritional therapistde  ms de 24 semanas y le han recetado medicamentos nuevos, comntelo con su obstetra y cul es su evolucin. Concurra a las consultas de control con su mdico segn las indicaciones.  Despus de recuperarse de la crisis  de asma, haga una cita con el mdico para conocer cmo puede reducir la probabilidad de futuros ataques. Si no cuenta con un mdico para que controle su asma, haga una cita con un mdico de atencin primaria para hablar de esta enfermedad. [place] DE INMEDIATO SI:   Empeora.  Tiene dificultad para respirar. Si la dificultad es intensa comunquese con el servicio de Multimedia programmer de su localidad (911 en los [place]).  Siente dolor o Adult nurse.  Tiene vmitos.  No puede retener los lquidos.  Elimina una expectoracin verde, amarilla, amarronada o sanguinolenta.  Tiene fiebre y los sntomas empeoran repentinamente.  Presenta dificultad para tragar. ASEGRESE DE QUE:   Comprende estas instrucciones.  Controlar su afeccin.  Recibir ayuda de inmediato si no mejora o si empeora.   Esta informacin no tiene Marine scientist el consejo del mdico. Asegrese de hacerle al mdico cualquier pregunta que tenga.   Document Released: 05/04/2008 Document Revised: 01/21/2013 Elsevier Interactive Patient Education [employer].

## 2015-07-16 NOTE — Progress Notes (Signed)
Date of Visit: 07/16/2015   HPI:  Patient presents for a same day walk-in appointment to discuss cough. Spanish interpreter utilized during today's visit.   For 8 days has had cough. Has bad attacks of coughing where she feels like she is choking and short of breath. Especially occurs at night. Has history of asthma and takes symbicort 2 puffs twice daily as well as singulair. Has noticed herself wheezing a lot. Decreased appetite but drinking well. Stooling and urinating normally. Has urinated on herself some with the coughing fits. Cough productive of white sputum sometimes, otherwise is dry. Has only used albuterol once this week since it is for "emergencies" only. Has had runny nose. Endorses discomfort in chest and back as well.   ROS: See HPI  [place]: history of stress incontinence, acne, chronic tension headaches, asthma, diverticulitis, GERD, hyperlipidemia  PHYSICAL EXAM: BP 124/75 mmHg  Pulse 82  Temp(Src) 98.2 F (36.8 C) (Oral)  Ht 5' (1.524 m)  Wt 117 lb 9.6 oz (53.343 kg)  BMI 22.97 kg/m2  SpO2 97% Gen: NAD, pleasnt, cooperative HEENT: normocephalic, atraumatic, moist mucous membranes, oropharynx clear and moist, nares patent Heart: regular rate and rhythm, no murmur, chest wall mildly tender to palpation  Lungs: normal work of breathing but occasional severe coughing fits. Mildly diminished air movement throughout with occasional expiratory wheeze Abdomen: soft nontender to palpation  Neuro: alert grossly nonfocal, speech normal Extremities: No appreciable lower extremity edema bilaterally   ASSESSMENT/PLAN:  1. Asthma exacerbation - presentation, history & exam consistent with asthma exacerbation likely due to viral bronchitis. Given duoneb in clinic with some improvement in symptoms. Post-neb exam with more wheezing, suggests better air movement. No hypoxia. EKG performed due to concurrent chest pain, and shows no signs of ischemia. Suspect chest pain & back pain are  due to profuse coughing. Will treat with steroid burst (prednisone 50mg  daily for 5 days). Advised to patient that she can take albuterol more often at home. 2 puffs q4h as needed. Follow up if not improving next week, or sooner this weekend if worsening. Patient and husband appreciative.  FOLLOW UP: Follow up as needed if symptoms worsen or fail to improve.    [place]. Ardelia Mems, [place]

## 2015-08-26 ENCOUNTER — Other Ambulatory Visit: Payer: Self-pay | Admitting: Family Medicine

## 2015-08-26 ENCOUNTER — Other Ambulatory Visit: Payer: Self-pay | Admitting: *Deleted

## 2015-08-26 NOTE — Telephone Encounter (Signed)
Pt's daughter called, the pt had to rush out of state because daughter was having an emergency c-section, she is out of her steroid medication and it is the only thing that helps her breathe. Her daughter would like [place] to call in her Rx to [facility] in [place], [place].  Also please call the daughter after medication has been called in. Thanks! ep

## 2015-08-26 NOTE — Telephone Encounter (Signed)
Pt's daughter called, the pt had to rush out of state because daughter was having an emergency c-section, she is out of her steroid medication and it is the only thing that helps her breathe. Her daughter would like [place] to call in her Rx to [facility] in [place], [place].  Also please call the daughter after medication has been called in. Thanks! ep

## 2015-08-27 MED ORDER — PREDNISONE 50 MG PO TABS: ORAL_TABLET | ORAL | 0 refills | Status: DC

## 2015-08-27 NOTE — Telephone Encounter (Signed)
It has been one Anne Warren

## 2015-08-27 NOTE — Telephone Encounter (Signed)
Called and spoke with family member.  Patient unavailable to talk as daughter was being taken back for surgery at that moment.  Evidently patient has been having some slight worsening of her asthma and asked for short steroid burst.  Is taking rest of her inhaler medications.  Will prescribe short-term burst of steroids but if her symptoms don't improve she needs to be seen by a physician.  Family was gracious and appreciative, and expressed understanding.

## 2015-08-27 NOTE — Addendum Note (Signed)
Addended byMingo Amber, Kayleen Memos on: 08/27/2015 05:21 PM   Modules accepted: Orders

## 2015-09-01 ENCOUNTER — Encounter: Payer: Self-pay | Admitting: Family Medicine

## 2015-09-01 ENCOUNTER — Ambulatory Visit (INDEPENDENT_AMBULATORY_CARE_PROVIDER_SITE_OTHER): Payer: Medicare Other | Admitting: Family Medicine

## 2015-09-01 VITALS — BP 181/85 | HR 68 | Temp 98.4°F | Wt 117.2 lb

## 2015-09-01 DIAGNOSIS — R0600 Dyspnea, unspecified: Secondary | ICD-10-CM | POA: Insufficient documentation

## 2015-09-01 DIAGNOSIS — I10 Essential (primary) hypertension: Secondary | ICD-10-CM | POA: Diagnosis not present

## 2015-09-01 MED ORDER — DOXYCYCLINE HYCLATE 100 MG PO TABS: ORAL_TABLET | ORAL | 0 refills | Status: DC

## 2015-09-01 MED ORDER — MONTELUKAST SODIUM 10 MG PO TABS: 10.0000 mg | ORAL_TABLET | Freq: Every day | ORAL | 11 refills | Status: DC

## 2015-09-01 MED ORDER — BENZONATATE 100 MG PO CAPS: 100.0000 mg | ORAL_CAPSULE | Freq: Two times a day (BID) | ORAL | 0 refills | Status: DC | PRN

## 2015-09-01 MED ORDER — PREDNISONE 20 MG PO TABS: 40.0000 mg | ORAL_TABLET | Freq: Every day | ORAL | 0 refills | Status: DC

## 2015-09-01 NOTE — Assessment & Plan Note (Signed)
Patient seen today with complaints of dyspnea. Patient has long history of difficult to control asthma. Symptoms seem consistent with mild to moderate asthma exacerbation. However cannot rule out more serious symptoms at this time. Patient had been provided 5 day course of prednisone which improved her symptoms but did not resolve them. Her long term history of asthma makes me consider management that resembles COPD exacerbation rather than asthma exacerbation. Also patient's long history of asthma makes her high risk for possible pulmonary hypertension. Patient has never had an echocardiogram and is now complaining of symptoms of pillow orthopnea. - Echocardiogram ordered today. - Doxycycline - Prednisone 5 days - Tessalon Perles when necessary for cough - Restart Singulair - Continue home medication regimen. - Follow-up with PCP and pulmonology

## 2015-09-01 NOTE — Progress Notes (Signed)
HPI  CC: asthma. Recently seemed to have worsening symptoms of asthma over the past 2 months. Regular episodes of coughing. Wakes up every night from coughing. Frequently out of breath. Hasn't been taking singulair for over a month. No fever. Occasionally coughs up some "white" material. Worse at night. Props herself up on 3 pillows. SOB seems worse when she's active. Husband believes symptoms to be secondary to her changing her asthma medication regimen.  ROS: Denies chest pain, headache, fever, chills, dizziness, lightheadedness, or peripheral swelling.  CC, SH/smoking status, and VS noted  Objective: BP (!) 181/85 (BP Location: Left Arm, Patient Position: Sitting, Cuff Size: Normal)   Pulse 68   Temp 98.4 F (36.9 C) (Oral)   Wt 117 lb 3.2 oz (53.2 kg)   SpO2 95%   BMI 22.89 kg/m  Gen: NAD, alert, cooperative, and pleasant. HEENT: NCAT, EOMI, PERRL, OP clear, no LAD CV: RRR, no murmur Resp: Wheezing noted bilaterally. Cannot rule out faint crackles at right base. Ext: No edema, warm Neuro: Alert and oriented, Speech clear, No gross deficits  Assessment and plan:  Dyspnea Patient seen today with complaints of dyspnea. Patient has long history of difficult to control asthma. Symptoms seem consistent with mild to moderate asthma exacerbation. However cannot rule out more serious symptoms at this time. Patient had been provided 5 day course of prednisone which improved her symptoms but did not resolve them. Her long term history of asthma makes me consider management that resembles COPD exacerbation rather than asthma exacerbation. Also patient's long history of asthma makes her high risk for possible pulmonary hypertension. Patient has never had an echocardiogram and is now complaining of symptoms of pillow orthopnea. - Echocardiogram ordered today. - Doxycycline - Prednisone 5 days - Tessalon Perles when necessary for cough - Restart Singulair - Continue home medication  regimen. - Follow-up with PCP and pulmonology  Hypertension Patient noted to be hypertensive on exam. However, I believe this is mostly secondary to the discomfort she was in due to her persistent coughing.   Orders Placed This Encounter  Procedures  . ECHOCARDIOGRAM COMPLETE    Standing Status:   Future    Standing Expiration Date:   12/01/2016    Order Specific Question:   Where should this test be performed    Answer:   [facility]    Order Specific Question:   Complete or Limited study?    Answer:   Complete    Order Specific Question:   Does the patient have a known history of hypersensitivity to Perflutren (aka Scientist, research (medical) for echocardiograms - CHECK ALLERGIES)    Answer:   No    Order Specific Question:   ADMINISTER PERFLUTERN    Answer:   ADMINISTER PERFLUTREN    Order Specific Question:   Expected Date:    Answer:   1 week    Order Specific Question:   Reason for exam-Echo    Answer:   Dyspnea  786.09 / R06.00    Meds ordered this encounter  Medications  . predniSONE (DELTASONE) 20 MG tablet    Sig: Take 2 tablets (40 mg total) by mouth daily with breakfast.    Dispense:  10 tablet    Refill:  0  . doxycycline (VIBRA-TABS) 100 MG tablet    Sig: Take 1 tablet every 12 hours on day 1, then take 1 tablet a day until the bottle is empty.    Dispense:  6 tablet    Refill:  0  . benzonatate (TESSALON) 100 MG capsule    Sig: Take 1 capsule (100 mg total) by mouth 2 (two) times daily as needed for cough.    Dispense:  20 capsule    Refill:  0  . montelukast (SINGULAIR) 10 MG tablet    Sig: Take 1 tablet (10 mg total) by mouth at bedtime.    Dispense:  30 tablet    Refill:  11     Elberta Leatherwood, MD,MS,  PGY3 09/01/2015 2:40 PM

## 2015-09-01 NOTE — Assessment & Plan Note (Signed)
Patient noted to be hypertensive on exam. However, I believe this is mostly secondary to the discomfort she was in due to her persistent coughing.

## 2015-09-01 NOTE — Patient Instructions (Signed)
It was a pleasure seeing you today in our clinic. Today we discussed your cough. Here is the treatment plan we have discussed and agreed upon together:   - I would like to have you get an echocardiogram. We will inform me of when this appointment is to occur. - I've prescribed you prednisone. Take 2 tablets every day until the bottle is empty. - I prescribed you doxycycline. Take 1 tablet every 12 hours for the first day. Then 1 tablet a day after that. - I would like for you to restart taking Singulair. - I've provided you Tessalon Perles to help with the cough.

## 2015-09-13 ENCOUNTER — Other Ambulatory Visit: Payer: Self-pay

## 2015-09-13 ENCOUNTER — Ambulatory Visit (HOSPITAL_COMMUNITY): Payer: Medicare Other | Attending: Cardiovascular Disease

## 2015-09-13 ENCOUNTER — Encounter (HOSPITAL_COMMUNITY): Payer: Self-pay | Admitting: Radiology

## 2015-09-13 DIAGNOSIS — Z87891 Personal history of nicotine dependence: Secondary | ICD-10-CM | POA: Insufficient documentation

## 2015-09-13 DIAGNOSIS — I351 Nonrheumatic aortic (valve) insufficiency: Secondary | ICD-10-CM | POA: Insufficient documentation

## 2015-09-13 DIAGNOSIS — R0600 Dyspnea, unspecified: Secondary | ICD-10-CM | POA: Diagnosis not present

## 2015-09-13 DIAGNOSIS — E785 Hyperlipidemia, unspecified: Secondary | ICD-10-CM | POA: Diagnosis not present

## 2015-09-13 DIAGNOSIS — I10 Essential (primary) hypertension: Secondary | ICD-10-CM | POA: Insufficient documentation

## 2015-09-23 ENCOUNTER — Ambulatory Visit: Payer: Medicare Other | Admitting: Internal Medicine

## 2015-10-07 ENCOUNTER — Ambulatory Visit (INDEPENDENT_AMBULATORY_CARE_PROVIDER_SITE_OTHER): Payer: Medicare Other | Admitting: Family Medicine

## 2015-10-07 ENCOUNTER — Encounter: Payer: Self-pay | Admitting: Family Medicine

## 2015-10-07 ENCOUNTER — Other Ambulatory Visit: Payer: Self-pay | Admitting: Family Medicine

## 2015-10-07 VITALS — BP 159/76 | HR 88 | Temp 97.7°F | Ht 60.0 in | Wt 117.2 lb

## 2015-10-07 DIAGNOSIS — Z0000 Encounter for general adult medical examination without abnormal findings: Secondary | ICD-10-CM | POA: Diagnosis not present

## 2015-10-07 DIAGNOSIS — J4531 Mild persistent asthma with (acute) exacerbation: Secondary | ICD-10-CM | POA: Diagnosis not present

## 2015-10-07 DIAGNOSIS — Z23 Encounter for immunization: Secondary | ICD-10-CM

## 2015-10-07 DIAGNOSIS — I10 Essential (primary) hypertension: Secondary | ICD-10-CM

## 2015-10-07 DIAGNOSIS — Z1231 Encounter for screening mammogram for malignant neoplasm of breast: Secondary | ICD-10-CM

## 2015-10-07 MED ORDER — DM-GUAIFENESIN ER 30-600 MG PO TB12: 2.0000 | ORAL_TABLET | Freq: Two times a day (BID) | ORAL | 0 refills | Status: DC | PRN

## 2015-10-07 MED ORDER — DOXYCYCLINE HYCLATE 100 MG PO TABS: ORAL_TABLET | ORAL | 0 refills | Status: DC

## 2015-10-07 MED ORDER — PREDNISONE 50 MG PO TABS: 50.0000 mg | ORAL_TABLET | Freq: Every day | ORAL | 0 refills | Status: DC

## 2015-10-07 MED ORDER — LORATADINE 10 MG PO TABS: 10.0000 mg | ORAL_TABLET | Freq: Every day | ORAL | 11 refills | Status: DC

## 2015-10-07 NOTE — Assessment & Plan Note (Signed)
Flu and PNA vaccines given. Tdap prescription given to patient to get at pharmacy. Counseled patient to get mammogram.

## 2015-10-07 NOTE — Assessment & Plan Note (Addendum)
Patient presents with acute exacerbation of Asthma. -provided prescription for Prednisone Burst, Doxycycline for 5 days, and Mucinex. -counseled on proper use of Symbicort and Albuterol -start Loratadine for seasonal allergies -not taking Singulair so removed from medication list

## 2015-10-07 NOTE — Assessment & Plan Note (Signed)
Blood pressure slightly elevated likely due to acute exacerbation. Recheck at follow up visit.

## 2015-10-07 NOTE — Patient Instructions (Signed)
It was nice to see you today.  Please start Doxycycline, Prednisone, and Mucinex  Regresa a la oficina en 1-2 semanas.

## 2015-10-07 NOTE — Progress Notes (Addendum)
   Subjective:    Patient ID: Anne Warren, female    DOB: 1940-11-07, 75 y.o.   MRN: [id number]  HPI 75 y/o female presents for follow up of asthma/cough.  Asthma - seen one month ago and started on Prednisone/Doxycycline, had brief relief of symptoms, using Symbicort as prescribed (but also using as rescue), using albuterol occasionally but does not help. Was also given Tessalon but not working. Increasing cough and dry throat. Not using Singulair (did not help symptoms). Previously on Claritin for seasonal allergies (worst in the fall).    Review of Systems  Constitutional: Negative for chills, diaphoresis and fatigue.  Respiratory: Positive for cough, shortness of breath and wheezing.   Cardiovascular: Negative for chest pain.    Dry mouth and throat.     Objective:   Physical Exam BP (!) 159/76   Pulse 88   Temp 97.7 F (36.5 C) (Oral)   Ht 5' (1.524 m)   Wt 117 lb 3.2 oz (53.2 kg)   BMI 22.89 kg/m   Gen: pleasant female, NAD Cardiac: RRR, S1 and S2 present, no murmur Resp: diffuse wheezes, normal work of breathing  Reviewed Echo - EF [id number], Grade 1 Diastolic CHF    Assessment & Plan:  Asthma with exacerbation Patient presents with acute exacerbation of Asthma. -provided prescription for Prednisone Burst, Doxycycline for 5 days, and Mucinex. -counseled on proper use of Symbicort and Albuterol -start Loratadine for seasonal allergies -not taking Singulair so removed from medication list  Hypertension Blood pressure slightly elevated likely due to acute exacerbation. Recheck at follow up visit.   Health care maintenance Flu and PNA vaccines given. Tdap prescription given to patient to get at pharmacy. Counseled patient to get mammogram.

## 2015-10-08 ENCOUNTER — Other Ambulatory Visit: Payer: Self-pay | Admitting: Family Medicine

## 2015-10-19 ENCOUNTER — Ambulatory Visit: Payer: Medicare Other

## 2015-10-20 ENCOUNTER — Ambulatory Visit
Admission: RE | Admit: 2015-10-20 | Discharge: 2015-10-20 | Disposition: A | Discharge status: Home / Self Care | Payer: Medicare Other | Source: Ambulatory Visit | Attending: Family Medicine | Admitting: Family Medicine

## 2015-10-20 DIAGNOSIS — Z1231 Encounter for screening mammogram for malignant neoplasm of breast: Secondary | ICD-10-CM | POA: Diagnosis not present

## 2015-10-20 NOTE — Progress Notes (Signed)
   Subjective:    Patient ID: Anne Warren, female    DOB: 20-May-1940, 75 y.o.   MRN: [id number]  HPI 75 y/o Hispanic female presents for follow up of Asthma.   Asthma Seen on 9/7 and started on Prednisone and Doxycycline, better than last visit, using Symbicort as prescribed, not using albuterol as has not needed  Allergies Started on Loratadine at last visit, tolerating well, has not taken recently due to abdominal pain.   Abdominal pain Both lower quadrants (worse in the left side), history of diverticulosis, 2 days since last BM, some nausea, no emesis, not eating well due to abdominal pain. She does admit to intermittent pain with urination.    Review of Systems  Constitutional: Positive for chills. Negative for appetite change and fever.  Gastrointestinal: Positive for abdominal pain, constipation and nausea. Negative for vomiting.       Objective:   Physical Exam BP 130/72   Pulse 88   Temp 98.5 F (36.9 C) (Oral)   Ht 5' (1.524 m)   Wt 114 lb (51.7 kg)   BMI 22.26 kg/m  Gen: pleasant female, NAD Cardiac: RRR, S1 and S2 present, no murmur Resp: scattered wheezes, no increased work of breathing, improved after using albuterol 2 puffs Abdomen: soft, bilateral lower quadrant tenderness (worse on the left side), normal bowel sounds     Assessment & Plan:  Asthma with exacerbation Resolved. -continue Symbicort BID and Albuterol prn -encouraged use of spacer  Hypertension Blood pressure well controlled today.   Diverticulitis of colon Patient presents with 2 weeks history of left lower abdominal pain most consistent with history of diverticulosis. -check urine as also having some dysuria -start Flagyl and Cipro -colace for constipation

## 2015-10-21 ENCOUNTER — Encounter: Payer: Self-pay | Admitting: Family Medicine

## 2015-10-21 ENCOUNTER — Ambulatory Visit (INDEPENDENT_AMBULATORY_CARE_PROVIDER_SITE_OTHER): Payer: Medicare Other | Admitting: Family Medicine

## 2015-10-21 VITALS — BP 130/72 | HR 88 | Temp 98.5°F | Ht 60.0 in | Wt 114.0 lb

## 2015-10-21 DIAGNOSIS — R109 Unspecified abdominal pain: Secondary | ICD-10-CM | POA: Diagnosis not present

## 2015-10-21 DIAGNOSIS — K5732 Diverticulitis of large intestine without perforation or abscess without bleeding: Secondary | ICD-10-CM | POA: Diagnosis not present

## 2015-10-21 DIAGNOSIS — J4531 Mild persistent asthma with (acute) exacerbation: Secondary | ICD-10-CM

## 2015-10-21 DIAGNOSIS — I10 Essential (primary) hypertension: Secondary | ICD-10-CM | POA: Diagnosis not present

## 2015-10-21 LAB — POCT URINALYSIS DIPSTICK
Bilirubin, UA: NEGATIVE
Blood, UA: NEGATIVE
GLUCOSE UA: NEGATIVE
KETONES UA: NEGATIVE
LEUKOCYTES UA: NEGATIVE
Nitrite, UA: NEGATIVE
Protein, UA: NEGATIVE
SPEC GRAV UA: 1.025
Urobilinogen, UA: 0.2
pH, UA: 5.5

## 2015-10-21 MED ORDER — METRONIDAZOLE 500 MG PO TABS: 500.0000 mg | ORAL_TABLET | Freq: Three times a day (TID) | ORAL | 0 refills | Status: DC

## 2015-10-21 MED ORDER — CIPROFLOXACIN HCL 500 MG PO TABS: 500.0000 mg | ORAL_TABLET | Freq: Two times a day (BID) | ORAL | 0 refills | Status: DC

## 2015-10-21 MED ORDER — DOCUSATE SODIUM 100 MG PO CAPS: 100.0000 mg | ORAL_CAPSULE | Freq: Two times a day (BID) | ORAL | 2 refills | Status: DC

## 2015-10-21 MED ORDER — BUDESONIDE-FORMOTEROL FUMARATE 80-4.5 MCG/ACT IN AERO: 2.0000 | INHALATION_SPRAY | Freq: Two times a day (BID) | RESPIRATORY_TRACT | 2 refills | Status: DC

## 2015-10-21 NOTE — Addendum Note (Signed)
Addended by: Junious Dresser on: 10/21/2015 11:03 AM   Modules accepted: Orders

## 2015-10-21 NOTE — Patient Instructions (Signed)
It was nice to see you today.  Start Cipro and Flagyl to treat your diverticulosis.

## 2015-10-21 NOTE — Assessment & Plan Note (Signed)
Resolved. -continue Symbicort BID and Albuterol prn -encouraged use of spacer

## 2015-10-21 NOTE — Assessment & Plan Note (Signed)
Patient presents with 2 weeks history of left lower abdominal pain most consistent with history of diverticulosis. -check urine as also having some dysuria -start Flagyl and Cipro -colace for constipation

## 2015-10-21 NOTE — Assessment & Plan Note (Signed)
Blood pressure well-controlled today. 

## 2015-10-25 ENCOUNTER — Other Ambulatory Visit: Payer: Self-pay | Admitting: Family Medicine

## 2015-12-13 ENCOUNTER — Encounter: Payer: Self-pay | Admitting: Family Medicine

## 2015-12-13 ENCOUNTER — Ambulatory Visit (INDEPENDENT_AMBULATORY_CARE_PROVIDER_SITE_OTHER): Payer: Medicare Other | Admitting: Family Medicine

## 2015-12-13 VITALS — BP 142/73 | HR 81 | Temp 97.6°F | Wt 113.0 lb

## 2015-12-13 DIAGNOSIS — J4551 Severe persistent asthma with (acute) exacerbation: Secondary | ICD-10-CM | POA: Diagnosis not present

## 2015-12-13 DIAGNOSIS — I10 Essential (primary) hypertension: Secondary | ICD-10-CM

## 2015-12-13 MED ORDER — BUDESONIDE-FORMOTEROL FUMARATE 80-4.5 MCG/ACT IN AERO: 2.0000 | INHALATION_SPRAY | Freq: Two times a day (BID) | RESPIRATORY_TRACT | 2 refills | Status: DC

## 2015-12-13 MED ORDER — PREDNISONE 50 MG PO TABS: 50.0000 mg | ORAL_TABLET | Freq: Every day | ORAL | 0 refills | Status: DC

## 2015-12-13 NOTE — Patient Instructions (Signed)
Prevención de las crisis de asma °(Asthma Attack Prevention) °Si bien probablemente no pueda controlar ser una persona asmática, puede tomar medidas para prevenir las crisis de asma. La mejor forma de prevenir las crisis de asma es mantener el asma bajo control. Para lograrlo, puede hacer lo siguiente: °· Tome los medicamentos según las indicaciones. °· Evite las cosas que pueden irritarle las vías respiratorias o intensificar los síntomas de asma (desencadenantes del asma). °· Lleve un registro sobre la eficacia en el control del asma y sobre los cambios en los síntomas. °· Actúe rápidamente si los síntomas de asma se intensifican (crisis de asma). °· Busque atención médica de emergencia cuando sea necesario. °¿CUÁLES SON LAS FORMAS DE PREVENIR UNA CRISIS DE ASMA? °Tenga un plan °Trabaje con el médico para crear una planificación por escrito para el control y el tratamiento de las crisis de asma (plan de acción para el asma). Este plan incluye lo siguiente: °· Una lista de los factores desencadenantes del asma y el modo en que puede evitarlos. °· Información acerca del momento en que se deben tomar los medicamentos y cuándo hay que cambiar las dosis. °· El uso de un dispositivo que mide el funcionamiento de los pulmones (espirómetro). °Controle el asma °Use el espirómetro y registre los resultados en un diario todos los días. Una reducción en los números del flujo espiratorio máximo en uno o más días puede indicar el comienzo de una crisis de asma. Esto puede ocurrir incluso antes de que empiece a percibir los síntomas. Para evitar que una crisis de asma empeore, siga los pasos del plan de acción para el asma. °Evite los factores desencadenantes del asma °Trabaje con el médico especialista en asma para determinar cuáles son los factores desencadenantes. Esto puede lograrse de la siguiente manera: °· Pruebas de alergia. °· Llevar un diario que indique cuándo ocurren las crisis de asma y cuáles son los factores que  pueden haber contribuido a que estas sucedan. °· Determinar si hay otras enfermedades que intensifican el asma. °Una vez que haya determinado cuáles son los factores desencadenantes del asma, tome las medidas para evitarlos. Estas pueden incluir evitar la exposición excesiva o prolongada a lo siguiente: °· Polvo. Pídale a otra persona que limpie su casa y pase la aspiradora una o dos veces por semana. Es mejor usar una aspiradora con filtro de partículas de alto rendimiento (HEPA). °· Humo. Esto incluye el humo de las fogatas, el humo de los incendios forestales y el humo ambiental de los productos que contienen tabaco. °· Caspa de las mascotas. Evite el contacto con los animales a los cuales sabe que es alérgico. °· Alérgenos que provienen de los árboles, los pastos o el polen. No pase mucho tiempo al aire libre cuando las concentraciones de polen son elevadas y cuando los días son muy ventosos. °· Aire muy frío, seco o húmedo. °· Moho. °· Alimentos con grandes cantidades de sulfitos. °· Olores fuertes. °· Sustancias contaminantes del aire exterior, como los escapes de los motores. °· Sustancias contaminantes del aire interior, como los aerosoles y los vapores de los productos de limpieza del hogar. °· Plagas hogareñas, entre ellas, los ácaros del polvo y las cucarachas, y el excremento de las plagas. °· Algunos medicamentos, incluidos los antiinflamatorios no esteroides (AINE). Hable siempre con el médico antes de suspender o de empezar a tomar cualquier medicamento nuevo. °Medicamentos °Tome los medicamentos de venta libre y los recetados solamente como se lo haya indicado el médico. Muchas crisis de asma se   pueden prevenir si se sigue cuidadosamente el plan de administracin de medicamentos. Es Scientist, research (life sciences) tomar los medicamentos del modo correcto cuando no se pueden evitar determinados factores desencadenantes del asma. Actuar con rapidez Si se produce una crisis de asma, actuar con rapidez puede  atenuar su gravedad y reducir su duracin. Tome estas medidas:   Est atento a los sntomas. Si tiene tos, sibilancias o dificultad para respirar, no espere hasta que los sntomas desaparezcan por s solos. Siga el plan de accin para el asma.  Si sigui el plan de accin para el asma y los sntomas no mejoran, llame al mdico o solicite atencin mdica de inmediato en el hospital ms cercano. Es importante que tenga en cuenta la frecuencia con la que tiene que usar el inhalador de rescate de accin rpida. Si est utilizando Forensic psychologist de rescate con ms frecuencia, tal vez esto signifique que el asma no est bajo control. Modificar el plan de tratamiento para controlar el asma puede ayudarlo a prevenir las crisis de asma en el futuro y a Scientist, forensic un mejor control de la enfermedad. CMO PUEDO EVITAR UNA CRISIS DE ASMA CUANDO HAGO ACTIVIDAD FSICA? Siga los consejos del mdico respecto de cundo usar el Tax inspector de accin rpida antes de Field seismologist actividad fsica. Muchas personas que tienen asma sufren la broncoconstriccin inducida por el ejercicio (BCIE). Esta afeccin suele agravarse al realizar actividad fsica enrgica en ambientes fros, hmedos o secos. Generalmente, las personas con BCIE pueden mantenerse muy activas con un tratamiento previo con un inhalador de accin rpida antes de realizar actividad fsica.   Esta informacin no tiene Marine scientist el consejo del mdico. Asegrese de hacerle al mdico cualquier pregunta que tenga.   Document Released: 01/03/2012 Document Revised: 10/07/2014 Elsevier Interactive Patient Education [employer].

## 2015-12-13 NOTE — Progress Notes (Signed)
    HPI  CC: Asthma flare  Anne Warren is a 75 y.o. , with the above CC.   Asthma Patient is here for evaluation of asthma. Patient's symptoms include chest tightness, dyspnea, non-productive cough and wheezing. Associated symptoms include left ear pain, facial pain left maxillary, headache occasionally when coughing a lot, nasal congestion, nonproductive cough, rhinorrhea clear, weight loss and wheezing. The patient has been suffering from these symptoms for approximately 10 years. Symptoms have been waxes/wanes since their onset. Medications used in the past to treat these symptoms include oral steroids, combination beta agonists/steroid inhalers and albuterol inhaler rescue. Suspected precipitants include no identifiable factor. Patient is awoken from sleep approximately none times per night. Patient has not required emergency room treatment for these symptoms, and has not required hospitalization. The patient has not been intubated in the past. Patient has a pulmonologist she sees, a couple months ago.   The following portions of the patient's history were reviewed and updated as appropriate: allergies, current medications, past family history, past medical history, past social history, past surgical history and problem list.  ROS: A 12-point review of systems was performed and negative, except as stated in the above HPI.  OBJECTIVE BP (!) 142/73   Pulse 81   Temp 97.6 F (36.4 C) (Oral)   Wt 113 lb (51.3 kg)   SpO2 94%   BMI 22.07 kg/m  Gen: NAD, alert, cooperative, and pleasant. HEENT: NCAT, EOMI, PERRL CV: RRR, no murmur Resp: +Coughing; Diffuse wheezing bilaterally, prolonged expiratory phase with poor airflow. non-labored breathing Abd: SNTND, BS present, no guarding or organomegaly Ext: No edema, warm Neuro: Alert and oriented, Speech clear, No gross deficits   ASSESSMENT AND PLAN: 1. Severe persistent asthma with acute exacerbation - Extensive workup already done by  Dr. Melvyn Novas, concern for adherence and also difficulty with language barrier, recommended to be seen by Spanish speaking pulmonologist, referral given today with names suggested by Dr. Melvyn Novas - predniSONE (DELTASONE) 50 MG tablet; Take 1 tablet (50 mg total) by mouth daily with breakfast.  Dispense: 5 tablet; Refill: 0 - budesonide-formoterol (SYMBICORT) 80-4.5 MCG/ACT inhaler; Inhale 2 puffs into the lungs 2 (two) times daily.  Dispense: 1 Inhaler; Refill: 2 - Ambulatory referral to Pulmonology  2. Essential hypertension - Due to patient's age, tolerable HTN. Continue medications, follow up close monitoring of BP.     Zenda Alpers, [facility] 12/13/2015 9:51 AM

## 2016-01-10 ENCOUNTER — Other Ambulatory Visit: Payer: Self-pay | Admitting: Family Medicine

## 2016-01-10 DIAGNOSIS — J4551 Severe persistent asthma with (acute) exacerbation: Secondary | ICD-10-CM

## 2016-01-10 NOTE — Telephone Encounter (Signed)
RN staff - please call patient and offer appointment if she is having breathing issues, I will not refill Prednisone unless she is evaluated, thanks

## 2016-01-10 NOTE — Telephone Encounter (Signed)
Refill request for Prednisone. Please send refill to [facility] in [place], [place].   Address: [address], [place] [address] Phone: [phone]

## 2016-01-11 NOTE — Telephone Encounter (Signed)
Daughter is calling for her mother to get a refill on prednisone. She is in [place] at this time helping her daughter who just had twins and the weather is cold and making it a little difficult to breath. She is just asking for about 5 days worth of medication then she will be back home. jw

## 2016-01-12 NOTE — Telephone Encounter (Signed)
RN staff - please notify patient that I have sent in a 5 day supply of prednisone.

## 2016-01-12 NOTE — Telephone Encounter (Signed)
Spoke with patient and she states her husband picked up the medication. Deseree Kennon Holter, CMA

## 2016-02-02 NOTE — Progress Notes (Signed)
   Subjective:    Patient ID: Anne Warren, female    DOB: [birth year], 76 y.o.   MRN: [id number]  HPI 75 y/o female presents for follow up of Asthma.  Asthma Seen on 11/13 in office for acute asthma flare. Referred to Spanish speaking pulmonologist at that time (missed appointment with Spanish Speaking Pulmonologist, patient unsure of name, do have number). Given prednisone burst. Currently prescribed Symbicort 80-4.5 two puffs BID and Albuterol prn.   Patient reports experiencing an acute asthma attack around 3 weeks ago when she was visiting family in [place]. States she used her albuterol and Symbicort with minimal relief. Did not have any more Prednisone 50 mg tablets and states that she called [facility] practice to request a refill. States that the Prednisone tablets seem to help the most with her asthma attacks.   She has been currently using her Symbicort as prescribed BID and Albuterol prn. She says that the Symbicort and albuterol don't seem to be helping with her Asthma.   Orthopnea Patient also reports orthopnea and sleeps with 2-3 pillows at night. Cannot walk up a flight of stairs or walk a block without feeling short of breath. Denies syncopal episodes, dizziness or substernal chest pain. Reports subjective chest pain after coughing episodes and when trying to take a deep breath in.  Denies any radiation of the pain to her arm, down her back, or jaw. Denies diaphoresis.   Weight loss Reports losing weight in the last 6-7 months without any changes to diet. Denies fever, chills, nausea or vomiting.   Xerostomia/dysphagia Has been experiencing dry mouth/throat and difficulty swallowing foods and liquids due to the dryness. No hemoptysis.   HM Due for Tdap and Shingles Vaccine Interested in receiving the Pneumonia vaccine Flu and PNA vaccines given on 10/07/2015  Review of Systems See above.   Social - nonsmoker    Objective:   Physical Exam BP  (!) 148/80   Pulse 73   Temp 97.5 F (36.4 C) (Oral)   Ht 5' (1.524 m)   Wt 116 lb 3.2 oz (52.7 kg)   SpO2 95%   BMI 22.69 kg/m   Gen: Pleasant female. NAD. Husband at chairside.  Neuro: Alert and interacting appropriately. CV: Regular rate and rhythm. No murmurs or rubs. No JVD.  Pulm: Distant lung sounds. Bilateral rhonchi and wheezes. No rales.  Abd: Soft and non-tender.  Ext: Warm and well perfused. No Bilateral LE edema. Strength 5/5 Upper and lower extremities.   Echo 08/2015 EF [id number], Grade 1 Diastolic Dysfunction     Assessment & Plan:  Asthma with exacerbation Patient continues to have frequent asthma flares. Currently having symptoms consistent with exacerbation. -Prednisone 50 mg daily for 5 days -Increase Symbicort to 160-4.5 2 inhalations BID -provided nebulizer machine and prescription for albuterol nebs -counseled to make appointment with Spanish speaking pulmonologist -CTA chest to rule out PE/interstitial lung disease as cause of frequent dyspnea  Chest pain Intermittent chest pain worsened with cough/deep breath. Likely related to asthma exacerbation. -CTA to rule out PE given recurrent symptoms  Dry mouth Check ANA to evaluate for rheumatologic cause. May also be a side effect of asthma inhalers.

## 2016-02-03 ENCOUNTER — Telehealth: Payer: Self-pay | Admitting: Family Medicine

## 2016-02-03 ENCOUNTER — Encounter: Payer: Self-pay | Admitting: Family Medicine

## 2016-02-03 ENCOUNTER — Ambulatory Visit (INDEPENDENT_AMBULATORY_CARE_PROVIDER_SITE_OTHER): Payer: Medicare Other | Admitting: Family Medicine

## 2016-02-03 ENCOUNTER — Ambulatory Visit (HOSPITAL_COMMUNITY)
Admission: RE | Admit: 2016-02-03 | Discharge: 2016-02-03 | Disposition: A | Discharge status: Home / Self Care | Payer: Medicare Other | Source: Ambulatory Visit | Attending: Family Medicine | Admitting: Family Medicine

## 2016-02-03 ENCOUNTER — Other Ambulatory Visit (HOSPITAL_COMMUNITY)
Admission: RE | Admit: 2016-02-03 | Discharge: 2016-02-03 | Disposition: A | Discharge status: Home / Self Care | Payer: Medicare Other | Source: Ambulatory Visit | Attending: Family Medicine | Admitting: Family Medicine

## 2016-02-03 DIAGNOSIS — J4541 Moderate persistent asthma with (acute) exacerbation: Secondary | ICD-10-CM | POA: Diagnosis not present

## 2016-02-03 DIAGNOSIS — J9811 Atelectasis: Secondary | ICD-10-CM | POA: Insufficient documentation

## 2016-02-03 DIAGNOSIS — I700 Atherosclerosis of aorta: Secondary | ICD-10-CM | POA: Insufficient documentation

## 2016-02-03 DIAGNOSIS — I2510 Atherosclerotic heart disease of native coronary artery without angina pectoris: Secondary | ICD-10-CM | POA: Diagnosis not present

## 2016-02-03 DIAGNOSIS — J4551 Severe persistent asthma with (acute) exacerbation: Secondary | ICD-10-CM

## 2016-02-03 DIAGNOSIS — R0602 Shortness of breath: Secondary | ICD-10-CM | POA: Diagnosis not present

## 2016-02-03 DIAGNOSIS — R079 Chest pain, unspecified: Secondary | ICD-10-CM | POA: Diagnosis not present

## 2016-02-03 DIAGNOSIS — R682 Dry mouth, unspecified: Secondary | ICD-10-CM

## 2016-02-03 HISTORY — DX: Dry mouth, unspecified: R68.2

## 2016-02-03 LAB — BASIC METABOLIC PANEL
Anion gap: 9 (ref 5–15)
BUN: 13 mg/dL (ref 6–20)
CHLORIDE: 104 mmol/L (ref 101–111)
CO2: 28 mmol/L (ref 22–32)
CREATININE: 0.69 mg/dL (ref 0.44–1.00)
Calcium: 9.7 mg/dL (ref 8.9–10.3)
GFR calc non Af Amer: >60 mL/min (ref >60)
GLUCOSE: 103 mg/dL — AB (ref 65–99)
Potassium: 3.5 mmol/L (ref 3.5–5.1)
Sodium: 141 mmol/L (ref 135–145)

## 2016-02-03 MED ORDER — PREDNISONE 50 MG PO TABS: ORAL_TABLET | ORAL | 0 refills | Status: DC

## 2016-02-03 MED ORDER — BUDESONIDE-FORMOTEROL FUMARATE 160-4.5 MCG/ACT IN AERO: 2.0000 | INHALATION_SPRAY | Freq: Two times a day (BID) | RESPIRATORY_TRACT | 3 refills | Status: DC

## 2016-02-03 MED ORDER — IOPAMIDOL (ISOVUE-370) INJECTION 76%
INTRAVENOUS | Status: AC
  Administered 2016-02-03: 100 mL via INTRAVENOUS
  Filled 2016-02-03: qty 100

## 2016-02-03 MED ORDER — ALBUTEROL SULFATE (2.5 MG/3ML) 0.083% IN NEBU: 2.5000 mg | INHALATION_SOLUTION | Freq: Four times a day (QID) | RESPIRATORY_TRACT | 1 refills | Status: DC | PRN

## 2016-02-03 NOTE — Telephone Encounter (Signed)
Spoke to patient's husband about CTA results. No PE.

## 2016-02-03 NOTE — Assessment & Plan Note (Signed)
Patient continues to have frequent asthma flares. Currently having symptoms consistent with exacerbation. -Prednisone 50 mg daily for 5 days -Increase Symbicort to 160-4.5 2 inhalations BID -provided nebulizer machine and prescription for albuterol nebs -counseled to make appointment with Spanish speaking pulmonologist -CTA chest to rule out PE/interstitial lung disease as cause of frequent dyspnea

## 2016-02-03 NOTE — Assessment & Plan Note (Signed)
Check ANA to evaluate for rheumatologic cause. May also be a side effect of asthma inhalers.

## 2016-02-03 NOTE — Assessment & Plan Note (Signed)
Intermittent chest pain worsened with cough/deep breath. Likely related to asthma exacerbation. -CTA to rule out PE given recurrent symptoms

## 2016-02-03 NOTE — Patient Instructions (Signed)
It was nice to see you today.   Please get the CTA of your chest completed today.  [facility] to [facility] test.  Start new dose of Symbicort. Prednisone sent to pharmacy.   Make an appointment to see me next week for follow up.

## 2016-02-04 ENCOUNTER — Encounter: Payer: Self-pay | Admitting: Family Medicine

## 2016-02-04 LAB — ANTINUCLEAR ANTIBODIES, IFA: ANTINUCLEAR ANTIBODIES, IFA: NEGATIVE

## 2016-02-09 NOTE — Progress Notes (Signed)
   Subjective:    Patient ID: Anne Warren, female    DOB: 1940/09/24, 76 y.o.   MRN: [id number]  HPI 76 y/o Hispanic female presents for follow up of Asthma.  Asthma Seen on 1/4 with acute exacerbation. Started on Prednisone 50 mg for 5 days. Increased Symbicort to 160-4.5 two inhalations BID (has not started new one, would like to finish old inhaler first). Provided Nebulizer machine and albuterol nebs (using twice daily). Underwent CTA (see results below). Reports some tremors with nebulizer. Also reports some dizziness with nebulizer and cough.    Social Leaving for [place] to see grandchildren for the next month.    Review of Systems  Constitutional: Negative for chills, fatigue and fever.  Respiratory: Positive for cough. Negative for shortness of breath.   Cardiovascular: Negative for chest pain.  Gastrointestinal: Negative for diarrhea and nausea.  Neurological: Negative for headaches.       Objective:   Physical Exam BP (!) 160/70   Pulse 79   Temp 98 F (36.7 C) (Oral)   Ht 5' (1.524 m)   Wt 115 lb 9.6 oz (52.4 kg)   SpO2 96%   BMI 22.58 kg/m   Gen: pleasant Hispanic female, accompanied by husband Cardiac: RRR, S1 and S2 present, no murmur Resp: expiratory wheezes and rhonchi present, no increased work of breathing  CTA chest - no PE or mass, mild atelectasis present.  ANA Negative    Assessment & Plan:  Asthma with exacerbation Improved however still having cough. Wheezes/rhonchi present on exam. -complete another 5 days of Prednisone 50 mg daily (gave 10 tablets so that she can have in case of emergency as she is traveling to [place] for a month) -encouraged started new dose of Symbicort 160-4.5 two inhalation BID -continue albuterol nebs prn (counseled that tremors are a common side effect of this medication) -provided number to [place] speaking Pulmonologist (patient to call to set up appointment)  Hypertension BP elevated today. Suspect due to  albuterol/breathing issues.  -no addition of pharmacotherapy today however will check at follow up visit.

## 2016-02-11 ENCOUNTER — Encounter: Payer: Self-pay | Admitting: Family Medicine

## 2016-02-11 ENCOUNTER — Ambulatory Visit (INDEPENDENT_AMBULATORY_CARE_PROVIDER_SITE_OTHER): Payer: Medicare Other | Admitting: Family Medicine

## 2016-02-11 DIAGNOSIS — I10 Essential (primary) hypertension: Secondary | ICD-10-CM | POA: Diagnosis not present

## 2016-02-11 DIAGNOSIS — J4541 Moderate persistent asthma with (acute) exacerbation: Secondary | ICD-10-CM | POA: Diagnosis not present

## 2016-02-11 MED ORDER — PREDNISONE 50 MG PO TABS: ORAL_TABLET | ORAL | 0 refills | Status: DC

## 2016-02-11 NOTE — Addendum Note (Signed)
Addended by: Lupita Dawn on: 02/11/2016 10:42 AM   Modules accepted: Orders

## 2016-02-11 NOTE — Patient Instructions (Addendum)
Address: [address], [place], [place] [address]  Phone: [phone]  Please call Dr. Shaune Leeks (lung doctor) to make an appointment.   Take an additional 5 days of prednisone. Save the other 5 for an emergency.   Please return to see me after you return from [place].

## 2016-02-11 NOTE — Assessment & Plan Note (Signed)
Improved however still having cough. Wheezes/rhonchi present on exam. -complete another 5 days of Prednisone 50 mg daily (gave 10 tablets so that she can have in case of emergency as she is traveling to [place] for a month) -encouraged started new dose of Symbicort 160-4.5 two inhalation BID -continue albuterol nebs prn (counseled that tremors are a common side effect of this medication) -provided number to [place] speaking Pulmonologist (patient to call to set up appointment)

## 2016-02-11 NOTE — Assessment & Plan Note (Signed)
BP elevated today. Suspect due to albuterol/breathing issues.  -no addition of pharmacotherapy today however will check at follow up visit.

## 2016-03-10 ENCOUNTER — Other Ambulatory Visit: Payer: Self-pay | Admitting: Family Medicine

## 2016-03-10 DIAGNOSIS — J4541 Moderate persistent asthma with (acute) exacerbation: Secondary | ICD-10-CM

## 2016-03-20 ENCOUNTER — Encounter: Payer: Self-pay | Admitting: Internal Medicine

## 2016-03-20 ENCOUNTER — Ambulatory Visit (INDEPENDENT_AMBULATORY_CARE_PROVIDER_SITE_OTHER): Payer: Medicare Other | Admitting: Internal Medicine

## 2016-03-20 VITALS — BP 140/90 | HR 75 | Temp 98.3°F | Ht 60.0 in | Wt 119.0 lb

## 2016-03-20 DIAGNOSIS — J4541 Moderate persistent asthma with (acute) exacerbation: Secondary | ICD-10-CM | POA: Diagnosis not present

## 2016-03-20 MED ORDER — PREDNISONE 50 MG PO TABS: ORAL_TABLET | ORAL | 0 refills | Status: DC

## 2016-03-20 MED ORDER — ALBUTEROL SULFATE (2.5 MG/3ML) 0.083% IN NEBU
2.5000 mg | INHALATION_SOLUTION | Freq: Once | RESPIRATORY_TRACT | Status: AC
  Administered 2016-03-20: 2.5 mg via RESPIRATORY_TRACT

## 2016-03-20 MED ORDER — IPRATROPIUM BROMIDE 0.02 % IN SOLN
0.5000 mg | Freq: Once | RESPIRATORY_TRACT | Status: AC
  Administered 2016-03-20: 0.5 mg via RESPIRATORY_TRACT

## 2016-03-20 NOTE — Progress Notes (Signed)
   Subjective:   Patient: Anne Warren       Birthdate: [birth year]       MRN: [id number]      HPI  Anne Warren is a 76 y.o. female presenting for HTN f/u and asthma exacerbation.   HTN Patient noted to be slightly hypertensive at last visit. This was thought to be secondary to asthma exacerbation she was experiencing at the time. Patient has been on anti-hypertensives in the past, but not recently.   Asthma  Patient reports difficulty breathing recently. She completed two prednisone bursts last month, and said those were helpful, but her breathing has worsened again. She endorses wheezing and cough. She is using Symbicort 2 puffs BID as well as albuterol inhaler at night and Duonebs about once daily. She was given the number of a pulmonologist by Dr. Ree Kida, but says that when she called the office they were not longer located in [place] anymore. She endorses sputum production and has trouble breathing when laying flat due to this.   Smoking status reviewed. Patient is former smoker.   Review of Systems See HPI.     Objective:  Physical Exam  Constitutional: She is oriented to person, place, and time and well-developed, well-nourished, and in no distress.  HENT:  Head: Normocephalic and atraumatic.  Eyes: Conjunctivae and EOM are normal. Right eye exhibits no discharge. Left eye exhibits no discharge.  Cardiovascular: Normal rate, regular rhythm and normal heart sounds.   No murmur heard. Pulmonary/Chest:  Wheezes present bilaterally but good air movement. Normal WOB and able to speak in full sentences on RA. Coughing occasionally throughout encounter. Not in respiratory distress.   Neurological: She is alert and oriented to person, place, and time.  Skin: Skin is warm and dry.  Psychiatric: Affect and judgment normal.      Assessment & Plan:  Asthma with exacerbation Third exacerbation this year. Patient seems to be taking medication as prescribed, but is still having  symptoms. Was not able to see Spanish-speaking pulmonologist recommended by Dr. Ree Kida at last visit, and thus has not been evaluated by pulmonology in over a year (last seen by [facility] in 03/2015). Wheezing noted on lung exam, however patient breathing comfortably on RA. Will treat as asthma exacerbation.  - Received Duoneb in office with improvement in symptoms - Prednisone 50mg  qd x5 days - Encouraged continued correct usage of Symbicort, albuterol, and Duonebs - Patient to schedule appt with Dr. Valentina Lucks for medication maximization - Patient encouraged to f/u with pulmonology - Precepted with Dr. Mingo Amber   Precepted with Dr. Mingo Amber.   Adin Hector, MD, MPH PGY-2 [facility] Pager [phone]

## 2016-03-20 NOTE — Patient Instructions (Addendum)
It was nice seeing you again today Ms. Farag!  Please continue to take your Symbicort, albuterol, and Duoneb nebulizers as you have been. I have also prescribed prednisone. Please take this for five days. Save the other five days of medication for emergencies.   I have placed a referral to a pulmonologist (lung doctor) for you, however in the meantime you will see Dr. Valentina Lucks (our pharmacist) to test how well your lungs are working.   If you have a harder time breathing, or aren't responding to your regular asthma medications, please call our office or go to the emergency room.   Dr. Ree Kida will see you back in about a month.   If you have any questions or concerns, please feel free to call the clinic.   Be well,  Dr. Avon Gully

## 2016-03-20 NOTE — Assessment & Plan Note (Signed)
Third exacerbation this year. Patient seems to be taking medication as prescribed, but is still having symptoms. Was not able to see Spanish-speaking pulmonologist recommended by Dr. Ree Kida at last visit, and thus has not been evaluated by pulmonology in over a year (last seen by [facility] in 03/2015). Wheezing noted on lung exam, however patient breathing comfortably on RA. Will treat as asthma exacerbation.  - Received Duoneb in office with improvement in symptoms - Prednisone 50mg  qd x5 days - Encouraged continued correct usage of Symbicort, albuterol, and Duonebs - Patient to schedule appt with Dr. Valentina Lucks for medication maximization - Patient encouraged to f/u with pulmonology - Precepted with Dr. Mingo Amber

## 2016-04-12 ENCOUNTER — Encounter: Payer: Self-pay | Admitting: Internal Medicine

## 2016-04-12 ENCOUNTER — Ambulatory Visit (INDEPENDENT_AMBULATORY_CARE_PROVIDER_SITE_OTHER): Payer: Medicare Other | Admitting: Internal Medicine

## 2016-04-12 VITALS — BP 132/80 | HR 80 | Ht 59.0 in | Wt 118.8 lb

## 2016-04-12 DIAGNOSIS — J4541 Moderate persistent asthma with (acute) exacerbation: Secondary | ICD-10-CM

## 2016-04-12 MED ORDER — BUDESONIDE-FORMOTEROL FUMARATE 160-4.5 MCG/ACT IN AERO: 2.0000 | INHALATION_SPRAY | Freq: Two times a day (BID) | RESPIRATORY_TRACT | 0 refills | Status: DC

## 2016-04-12 MED ORDER — MONTELUKAST SODIUM 10 MG PO TABS: 10.0000 mg | ORAL_TABLET | Freq: Every day | ORAL | 11 refills | Status: DC

## 2016-04-12 MED ORDER — PREDNISONE 10 MG PO TABS: ORAL_TABLET | ORAL | 0 refills | Status: DC

## 2016-04-12 NOTE — Patient Instructions (Addendum)
Plan A = Automtico = singulair (montelukast) 10 mg uno cada pm y continuar symbicort [place] 2 inhalaciones a primera hora de la maana y luego otras 2 inhalaciones aproximadamente 12 horas despus.   Prednisona 10 mg [place] 3 durante tres das 2 durante tres das 1 durante tres das y pare Hydrographic surveyor en la tcnica del inhalador: reljate y sopla suavemente hacia fuera, luego respira profundo y [place], activando el inhalador al mismo tiempo que empiezas a Advice worker. Espera hasta 5 segundos si puedes. Soplar a travs de Mudlogger. Enjuague y haga grgaras con agua cuando haya terminado   Plan B = Respaldo Solo use su albuterol (PROAIR) como medicamento de rescate para usar si no puede recuperar el aliento descansando o haciendo un patrn de respiracin relajado con los labios del bolso. - Cuanto menos lo uses, mejor funcionar cuando lo necesites. - De acuerdo, use Chartered loss adjuster 2 inhalaciones cada 4 horas si debe hacerlo, pero llame para programar una cita si el uso aumenta por encima de sus necesidades habituales. - No salgas de casa sin eso! (pinsalo como el neumtico de repuesto para tu automvil)   Plan C = Crisis - solo use su nebulizador de albuterol si prueba el Plan B por primera vez y no ayuda a Orthoptist cada 4 horas, pero si comienza a necesitarlo regularmente, solicite cita inmediata. Consulte al coordinador de pacientes antes de irse hoy para programar la evaluacin de Allergy Grupo del Dr. Neldon Mc
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
## 2016-04-14 NOTE — Assessment & Plan Note (Addendum)
- 08/26/2014  > try dulera 100 2 bid  - 11/02/2014  extensive coaching HFA effectiveness =    75% (late trigger, good insp time/effort) - Allergy profile 11/02/2014 > Eos 1.3,  IgE  707 POS RAST trees/grasses  - singulair added 11/02/2014 not clear she took it or whether helpded  - 12/11/2014  extensive coaching HFA effectiveness =    75% (still late trigger)  - 12/11/2014 pfts wnl including fef 25-75 p am dulera 100 x 2 puffs  - 04/12/2016  After extensive coaching HFA effectiveness =    50% l(late trigger)  - 04/12/2016 rechallenge with singulair and refer to allergy   Continues with very difficult to control asthma. DDX of  difficult airways management almost all start with A and  include Adherence, Ace Inhibitors, Acid Reflux, Active Sinus Disease, Alpha 1 Antitripsin deficiency, Anxiety masquerading as Airways dz,  ABPA,  Allergy(esp in young), Aspiration (esp in elderly), Adverse effects of meds,  Active smokers, A bunch of PE's (a small clot burden can't cause this syndrome unless there is already severe underlying pulm or vascular dz with poor reserve) plus two Bs  = Bronchiectasis and Beta blocker use..and one C= CHF   Adherence is always the initial "prime suspect" and is a multilayered concern that requires a "trust but verify" approach in every patient - starting with knowing how to use medications, especially inhalers, correctly, keeping up with refills and understanding the fundamental difference between maintenance and prns vs those medications only taken for a very short course and then stopped and not refilled.  - see teaching hfa ? Needs to change to bud/formoterol nebs?  - rec always return with all meds in hand using a trust but verify approach to confirm accurate Medication  Reconciliation The principal here is that until we are certain that the  patients are doing what we've asked, it makes no sense to ask them to do more.   ? Acid (or non-acid) GERD > always difficult to exclude as  up to 75% of pts in some series report no assoc GI/ Heartburn symptoms> rec continue max (24h)  acid suppression and diet restrictions/ reviewed     ? Allergy > very strongly supported by allergy profile and resp to steroids so rec pred x 12 day slow taper  and re-start singulair, try refer again to Dr Bruna Potter group    I had an extended discussion with the patient reviewing all relevant studies completed to date and  lasting 15 to 20 minutes of a 25 minute visit    Each maintenance medication was reviewed in detail including most importantly the difference between maintenance and prns and under what circumstances the prns are to be triggered using an action plan format that is not reflected in the computer generated alphabetically organized AVS.    Please see AVS for specific instructions (translated into Spanish) unique to this visit that I personally wrote and verbalized to the the pt in detail and then reviewed with pt  by my nurse highlighting any  changes in therapy recommended at today's visit to their plan of care.    Each maintenance medication was reviewed in detail including most importantly the difference between maintenance and prns and under what circumstances the prns are to be triggered using an action plan format that is not reflected in the computer generated alphabetically organized AVS.    Please see AVS for specific instructions unique to this office visit that I personally wrote and verbalized to  the the pt in detail and then reviewed with pt  by my nurse highlighting any changes in therapy/plan of care  recommended at today's visit.

## 2016-05-23 ENCOUNTER — Encounter: Payer: Self-pay | Admitting: Allergy and Immunology

## 2016-05-23 ENCOUNTER — Ambulatory Visit (INDEPENDENT_AMBULATORY_CARE_PROVIDER_SITE_OTHER): Payer: Medicare Other | Admitting: Allergy and Immunology

## 2016-05-23 VITALS — BP 140/78 | HR 80 | Temp 98.3°F | Resp 18 | Ht 58.86 in | Wt 120.4 lb

## 2016-05-23 DIAGNOSIS — J3089 Other allergic rhinitis: Secondary | ICD-10-CM

## 2016-05-23 DIAGNOSIS — D721 Eosinophilia, unspecified: Secondary | ICD-10-CM

## 2016-05-23 DIAGNOSIS — K219 Gastro-esophageal reflux disease without esophagitis: Secondary | ICD-10-CM | POA: Diagnosis not present

## 2016-05-23 DIAGNOSIS — J4551 Severe persistent asthma with (acute) exacerbation: Secondary | ICD-10-CM | POA: Diagnosis not present

## 2016-05-23 MED ORDER — MEPOLIZUMAB 100 MG ~~LOC~~ SOLR
100.0000 mg | SUBCUTANEOUS | Status: DC
  Administered 2016-05-23: 100 mg via SUBCUTANEOUS

## 2016-05-23 MED ORDER — OMEPRAZOLE 40 MG PO CPDR: 40.0000 mg | DELAYED_RELEASE_CAPSULE | ORAL | 5 refills | Status: DC

## 2016-05-23 MED ORDER — METHYLPREDNISOLONE ACETATE 80 MG/ML IJ SUSP
80.0000 mg | Freq: Once | INTRAMUSCULAR | Status: AC
  Administered 2016-05-23: 80 mg via INTRAMUSCULAR

## 2016-05-23 MED ORDER — EPINEPHRINE 0.3 MG/0.3ML IJ SOAJ: INTRAMUSCULAR | 1 refills | Status: DC

## 2016-05-23 MED ORDER — MOMETASONE FUROATE 50 MCG/ACT NA SUSP: NASAL | 5 refills | Status: DC

## 2016-05-23 MED ORDER — FLUTICASONE PROPIONATE HFA 220 MCG/ACT IN AERO: INHALATION_SPRAY | RESPIRATORY_TRACT | 5 refills | Status: DC

## 2016-05-23 NOTE — Progress Notes (Signed)
Immunotherapy   Patient Details  Name: Anne Warren MRN: [id number] Date of Birth: 06-01-1940  05/23/2016  Stark Jock started injections for  Nucala Frequency: every 4 weeks Epi-Pen:Prescription for Epi-Pen given Consent signed and patient instructions given.   Orlene Erm 05/23/2016, 11:40 AM

## 2016-05-23 NOTE — Patient Instructions (Addendum)
  1. Allergen avoidance measures  2. Treat and prevent inflammation:   A. continue Symbicort 160 - 2 inhalations twice a day with spacer  B. start Flovent 220 - 2 inhalations twice a day with spacer  C. start Nasonex - one spray each nostril twice a day  D. prednisone 10 mg tablet 1 time per day  E. Depo-Medrol 80 IM delivered in clinic  3. Treat and prevent reflux:   A. decrease caffeine consumption at least 50%  B. start omeprazole 40 mg tablet in a.m.  4. If needed:   A. albuterol nebulization or ProAir HFA 2 puffs every 4-6 hours  B. OTC cetirizine 10 mg tablet 1 time per day  5. Discontinue montelukast  6. Submit for nucala administration - 100 mg sample administered in clinic today    7. Return to clinic in 3 weeks or earlier if problem
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
## 2016-06-20 ENCOUNTER — Ambulatory Visit (INDEPENDENT_AMBULATORY_CARE_PROVIDER_SITE_OTHER): Payer: Medicare Other | Admitting: *Deleted

## 2016-06-20 DIAGNOSIS — J4550 Severe persistent asthma, uncomplicated: Secondary | ICD-10-CM | POA: Diagnosis not present

## 2016-06-20 MED ORDER — BENRALIZUMAB 30 MG/ML ~~LOC~~ SOSY
30.0000 mg | PREFILLED_SYRINGE | SUBCUTANEOUS | Status: AC
  Administered 2016-06-20 – 2016-08-24 (×3): 30 mg via SUBCUTANEOUS

## 2016-06-20 NOTE — Progress Notes (Signed)
Immunotherapy   Patient Details  Name: Anne Warren MRN: [id number] Date of Birth: 12/01/1940  06/20/2016  Anne Warren started injections for  Fasenra. Frequency:every 28 days for 3 doses then once every 8 weeks. Epi-Pen:Epi-Pen Available  Consent signed and patient instructions given. No problems after 60 minutes in office.   Constance Holster 06/20/2016, 6:24 PM

## 2016-07-18 ENCOUNTER — Ambulatory Visit (INDEPENDENT_AMBULATORY_CARE_PROVIDER_SITE_OTHER): Payer: Medicare Other | Admitting: *Deleted

## 2016-07-18 DIAGNOSIS — J4550 Severe persistent asthma, uncomplicated: Secondary | ICD-10-CM | POA: Diagnosis not present

## 2016-08-15 ENCOUNTER — Ambulatory Visit: Payer: Self-pay

## 2016-08-24 ENCOUNTER — Ambulatory Visit (INDEPENDENT_AMBULATORY_CARE_PROVIDER_SITE_OTHER): Payer: Medicare Other | Admitting: *Deleted

## 2016-08-24 DIAGNOSIS — J4550 Severe persistent asthma, uncomplicated: Secondary | ICD-10-CM | POA: Diagnosis not present

## 2016-10-03 ENCOUNTER — Ambulatory Visit (INDEPENDENT_AMBULATORY_CARE_PROVIDER_SITE_OTHER): Payer: Medicare Other | Admitting: Student in an Organized Health Care Education/Training Program

## 2016-10-03 VITALS — BP 110/74 | HR 76 | Temp 97.4°F | Wt 124.4 lb

## 2016-10-03 DIAGNOSIS — R300 Dysuria: Secondary | ICD-10-CM | POA: Diagnosis not present

## 2016-10-03 DIAGNOSIS — R1084 Generalized abdominal pain: Secondary | ICD-10-CM

## 2016-10-03 DIAGNOSIS — R319 Hematuria, unspecified: Secondary | ICD-10-CM

## 2016-10-03 LAB — POCT URINALYSIS DIP (MANUAL ENTRY)
Bilirubin, UA: NEGATIVE
Glucose, UA: NEGATIVE mg/dL
Ketones, POC UA: NEGATIVE mg/dL
Leukocytes, UA: NEGATIVE
NITRITE UA: NEGATIVE
PH UA: 6 (ref 5.0–8.0)
Spec Grav, UA: 1.02 (ref 1.010–1.025)
UROBILINOGEN UA: 0.2 U/dL

## 2016-10-03 LAB — POCT UA - MICROSCOPIC ONLY

## 2016-10-03 MED ORDER — OMEPRAZOLE 40 MG PO CPDR: 40.0000 mg | DELAYED_RELEASE_CAPSULE | Freq: Two times a day (BID) | ORAL | 0 refills | Status: DC

## 2016-10-03 NOTE — Progress Notes (Signed)
   CC: Abdominal Pain  HPI: Anne Warren is a 76 y.o. female with PMH significant for HTN, GERD, diverticulitis, osteoarthritis, asthma, HLD who presents to [facility] today as a same day visit with abdominal pain of 1 weeks duration.   Abdominal pain: Onset: 1 weeks duration, pain was worst on Saturday and Sunday Location: Epigastric RUQ and LUQ  Radiation: To lower quadrants Palliating Factors: tylenol, Mylanta and Pepto bismol have helped Previous episodes: 3rd episode, in past has been told it is from diverticulitis Fevers: Has not checked, however had chills on Saturday Last bowel movement: This morning Diarrhea: Denies  Hematochezia: Denies Melena: Noted yesterday, no other episodes of black stools History of abdominal surgeries: history of cholecystectomy 30 years ago  Does not note aggravating or palliating factors. Pain comes on suddenly. Pain is sometimes associated with eating in the past, however this episode she does not feel has been associated with meals.  Has not been taking home prilosec.   Urinary symptoms: denies urinary frequency, urgency, or hematuria. However, does note dysuria which started on Saturday.    Review of Symptoms:   Patient does note generalized chest pain, and left arm numbness, she states this has been ongoing for the past 3 days. Left arm numbness is described as chronic.  See HPI for remainder of the ROS.   CC, SH/smoking status, and VS noted.  Objective: BP 110/74 (BP Location: Left Arm, Patient Position: Sitting, Cuff Size: Normal)   Pulse 76   Temp (!) 97.4 F (36.3 C) (Oral)   Wt 124 lb 6.4 oz (56.4 kg)   SpO2 94%   BMI 25.25 kg/m  GEN: NAD, alert, cooperative, and pleasant. Nontoxic appearing. NECK: full ROM RESPIRATORY: clear to auscultation bilaterally with no wheezes, rhonchi or rales, good effort CV: RRR, no m/r/g, no peripheral edema. No costochondral tenderness. GI: soft, +diffuse tenderness that is worse in lower quadrants,  +epigastric tenderness, no rebound, minimal voluntary guarding, no organomegaly. Pain with lying back for the exam and sitting up after the exam, but able to sit and speak with me comfortably during the interview. SKIN: warm and dry, no rashes or lesions NEURO: II-XII grossly intact, normal gait, peripheral sensation intact PSYCH: AAOx3, appropriate affect  Assessment and plan:  Generalized abdominal pain Ddx includes diverticulitis, gastritis, and UTI.  May be multifactorial. Very low suspicion of cardiac etiology given her notable tenderness on abdominal exam, stable vitals, normal cardiac exam - this seems to be an abdominal issue. - UA in the office not indicative of acute infection - will check CBC for Hgb and WBC - will restart omeprazole at 40 mg BID - if WBC is significantly elevated, will send for CT abdomen - if WBC is moderately elevated, will initiate abx (cipro and flagyl) for suspected diverticulitis - patient to make follow up visit in the office to be seen in 2 days (Thurs 9/6) - Red flags/return precautions discussed, including: vomiting, melena, fevers  Hematuria Persistent since 2015. Previous CT abdomen without bladder abnormalities (2015).  Does have dysuria. - will send urine for culture - if negative, will need to follow up with PCP for possible cystoscopy/repeat imaging  Precepted with Dr. [name] Hearing.  Orders Placed This Encounter  Procedures  . Urine Culture  . CBC  . POCT urinalysis dipstick  . POCT UA - Microscopic Only    Everrett Coombe, MD,MS,  PGY2 10/03/2016 12:22 PM

## 2016-10-03 NOTE — Patient Instructions (Addendum)
It was a pleasure seeing you today in our clinic. Today we discussed your abdominal pain. Here is the treatment plan we have discussed and agreed upon together:  - we will check blood and urine at today's visit. I will call you with the results of your labs. If you do not receive a call from me this week call our office back. - you can continue taking mylanta and pepto bismol - I will send omeprazole to your pharmacy, this can be taken twice daily.  - Please schedule a follow up visit to be seen again in our office on Thursday as you leave the office today.  If you develop fevers, worsened abdominal pain, vomiting, or more black stools, these would be reasons to seek medical attention sooner by either calling our office or going to the emergency department.  Our clinic's number is [phone]. Please call with questions or concerns about what we discussed today.  Be well, Dr. Burr Medico

## 2016-10-03 NOTE — Assessment & Plan Note (Addendum)
Ddx includes diverticulitis, gastritis, and UTI.  May be multifactorial. Very low suspicion of cardiac etiology given her notable tenderness on abdominal exam, stable vitals, normal cardiac exam - this seems to be an abdominal issue. - UA in the office not indicative of acute infection - will check CBC for Hgb and WBC - will restart omeprazole at 40 mg BID - if WBC is significantly elevated, will send for CT abdomen - if WBC is moderately elevated, will initiate abx (cipro and flagyl) for suspected diverticulitis - patient to make follow up visit in the office to be seen in 2 days (Thurs 9/6) - Red flags/return precautions discussed, including: vomiting, melena, fevers

## 2016-10-03 NOTE — Assessment & Plan Note (Addendum)
Persistent since 2015. Previous CT abdomen without bladder abnormalities (2015).  Does have dysuria. - will send urine for culture - if negative, will need to follow up with PCP for possible cystoscopy/repeat imaging

## 2016-10-04 LAB — CBC
HEMOGLOBIN: 13.6 g/dL (ref 11.1–15.9)
Hematocrit: 40.6 % (ref 34.0–46.6)
MCH: 29.7 pg (ref 26.6–33.0)
MCHC: 33.5 g/dL (ref 31.5–35.7)
MCV: 89 fL (ref 79–97)
Platelets: 219 10*3/uL (ref 150–379)
RBC: 4.58 x10E6/uL (ref 3.77–5.28)
RDW: 13.3 % (ref 12.3–15.4)
WBC: 8.1 10*3/uL (ref 3.4–10.8)

## 2016-10-05 ENCOUNTER — Ambulatory Visit (INDEPENDENT_AMBULATORY_CARE_PROVIDER_SITE_OTHER): Payer: Medicare Other | Admitting: Family Medicine

## 2016-10-05 ENCOUNTER — Encounter: Payer: Self-pay | Admitting: Family Medicine

## 2016-10-05 VITALS — BP 130/72 | HR 63 | Temp 97.9°F | Wt 124.0 lb

## 2016-10-05 DIAGNOSIS — K5732 Diverticulitis of large intestine without perforation or abscess without bleeding: Secondary | ICD-10-CM

## 2016-10-05 LAB — URINE CULTURE

## 2016-10-05 MED ORDER — METRONIDAZOLE 500 MG PO TABS: 500.0000 mg | ORAL_TABLET | Freq: Three times a day (TID) | ORAL | 0 refills | Status: DC

## 2016-10-05 MED ORDER — CIPROFLOXACIN HCL 500 MG PO TABS: 500.0000 mg | ORAL_TABLET | Freq: Two times a day (BID) | ORAL | 0 refills | Status: AC

## 2016-10-05 NOTE — Progress Notes (Signed)
Subjective:  Anne Warren is a 76 y.o. female who presents to the [facility] today for follow up for abdominal pain  HPI:  - Was seen on 10/03/16 for abdominal pain  - Thinks that she has diverticulitis - Has continued lower bilateral abdominal pain greater on L, feels colicky. Some slight improvement from 10/10 to 8/10. Feels distended.  - No chills or fevers - feels some improvement with omeprazole - Having trouble with stools, no blood in stool over the last 2 days, now light brown, only small amounts. Feels constipated.  - Possibly associated with L leg pain from hip    ROS: Per HPI  Objective:  Physical Exam: BP 130/72   Pulse 63   Temp 97.9 F (36.6 C) (Oral)   Wt 124 lb (56.2 kg)   SpO2 96%   BMI 25.17 kg/m   Gen: NAD, resting comfortably CV: RRR with no murmurs appreciated Pulm: NWOB, CTAB with no crackles, wheezes, or rhonchi GI: mildly distended. TTP diffusely with guarding and rebound but most TTP over LLQ. + bowel sounds.  MSK: no edema, cyanosis, or clubbing noted Skin: warm, dry Neuro: grossly normal, moves all extremities Psych: Normal affect and thought content  Results for orders placed or performed in visit on 10/03/16 (from the past 72 hour(s))  POCT urinalysis dipstick     Status: Abnormal   Collection Time: 10/03/16 10:39 AM  Result Value Ref Range   Color, UA yellow yellow   Clarity, UA clear clear   Glucose, UA negative negative mg/dL   Bilirubin, UA negative negative   Ketones, POC UA negative negative mg/dL   Spec Grav, UA 1.020 1.010 - 1.025   Blood, UA moderate (A) negative   pH, UA 6.0 5.0 - 8.0   Protein Ur, POC trace (A) negative mg/dL   Urobilinogen, UA 0.2 0.2 or 1.0 E.U./dL   Nitrite, UA Negative Negative   Leukocytes, UA Negative Negative  POCT UA - Microscopic Only     Status: None   Collection Time: 10/03/16 10:39 AM  Result Value Ref Range   WBC, Ur, HPF, POC NONE    RBC, urine, microscopic RARE    Bacteria, U Microscopic NONE     Epithelial cells, urine per micros 1-5   CBC     Status: None   Collection Time: 10/03/16 11:27 AM  Result Value Ref Range   WBC 8.1 3.4 - 10.8 x10E3/uL   RBC 4.58 3.77 - 5.28 x10E6/uL   Hemoglobin 13.6 11.1 - 15.9 g/dL   Hematocrit 40.6 34.0 - 46.6 %   MCV 89 79 - 97 fL   MCH 29.7 26.6 - 33.0 pg   MCHC 33.5 31.5 - 35.7 g/dL   RDW 13.3 12.3 - 15.4 %   Platelets 219 150 - 379 x10E3/uL  Urine Culture     Status: None   Collection Time: 10/03/16  2:51 PM  Result Value Ref Range   Urine Culture, Routine Final report    Organism ID, Bacteria Comment     Comment: Mixed urogenital flora Less than 10,000 colonies/mL      Assessment/Plan:  Diverticulitis of colon Patient returns with continued abdominal pain that has now localized to the LLQ, feels similar to diverticulitis flares she has had in the past. Is overall well appearing and has been tolerating po at home so is appropriate for outpatient management. - Cipro and flagyl for 7 days - advised miralax for constipatio - Discussed return precautions at length. If  not improvement and still well appearing will call back for CT abd. If worsens, cannot tolerate po, fevers, then patient will go to ER. Patient and husband voiced good understanding   Bufford Lope, DO PGY-2, [facility] 10/05/2016 4:03 PM

## 2016-10-05 NOTE — Patient Instructions (Signed)
Please take antibiotics for 7 days to treat your diverticulitis. If no improvement or worsening, please call us back or go the ER for imaging.   Diverticulitis (Diverticulitis) La diverticulitis es la inflamacin o infeccin de pequeas bolsas en el colon que se forman por una afeccin llamada diverticulosis. Las bolsas en el colon se denominan divertculos. El colon, o intestino grueso, es el lugar donde se absorbe agua y se forman las heces. [place] complicaciones de la diverticulitis, se incluyen:  Hemorragias.  Infeccin grave.  Dolor intenso.  Perforacin del colon.  Obstruccin del colon. CAUSAS La diverticulitis est causada por bacterias y La diverticulitis se produce cuando queda materia fecal retenida en los divertculos. Esto permite que crezcan bacterias en los divertculos, lo que puede llevar a la inflamacin e infeccin. [place] con diverticulosis corren riesgo de presentar diverticulitis. Las dietas que no incluyen suficiente fibra proveniente de frutas y Photographer pueden predisponer a la diverticulitis. [place] los sntomas de diverticulitis, se incluyen:  Dolor y Scientist, research (life sciences) en el abdomen. El dolor en general aparece en el lado izquierdo del abdomen, pero puede aparecer en otras zonas.  Fiebre o escalofros  Hinchazn.  Clicos.  Nuseas.  Vmitos.  Estreimiento.  Diarrea.  Sangre en la materia fecal. DIAGNSTICO El mdico le preguntar acerca de sus antecedentes mdicos y le har un examen fsico. Es posible que le hagan estudios, porque hay muchas enfermedades que pueden causar los mismos sntomas que la diverticulitis. Los estudios pueden incluir:  Anlisis de [place].  Anlisis de [place].  Estudios por imgenes del abdomen, entre ellos, radiografas y tomografas computarizadas. Cuando la afeccin est controlada, el mdico puede recomendarle que se haga una colonoscopa. La colonoscopa puede mostrar la gravedad de  los divertculos y si hay algo ms que est causando los sntomas. TRATAMIENTO La mayora de los casos de diverticulitis son leves y pueden tratarse Financial planner. El tratamiento puede incluir:  Tomar medicamentos para el dolor de [place].  Seguir una dieta lquida absoluta.  Tomar antibiticos por va oral durante 7a 10das. Los casos ms graves pueden tratarse [employer] hospital. El tratamiento puede incluir:  No comer ni beber nada.  Tomar medicamentos para el dolor recetados.  Recibir antibiticos a travs de una va IV.  Recibir lquidos y nutricin a travs de una va IV.  [place]. INSTRUCCIONES PARA EL CUIDADO EN EL HOGAR  Siga cuidadosamente las indicaciones del mdico.  Siga las indicaciones del mdico acerca de si debe consumir una dieta lquida o de otro tipo. Cuando los sntomas mejoren, el mdico puede indicarle que modifique la dieta y recomendarle que consuma una dieta con alto contenido de [place]. Las frutas y los vegetales son buenas fuentes de [place]. La fibra facilita la eliminacin de heces.  [place] los suplementos con fibra o los probiticos segn las indicaciones del [place] los medicamentos solamente como se lo haya indicado el mdico.  [place] con todas las visitas de control.  SOLICITE ATENCIN MDICA SI:  El dolor no mejora.  Le resulta difcil alimentarse.  Los movimientos intestinales no se normalizan.  SOLICITE ATENCIN MDICA DE INMEDIATO SI:  El dolor empeora.  Los sntomas no mejoran.  Los sntomas empeoran repentinamente.  Tiene fiebre.  Ha vomitado repetidas veces.  La materia fecal es sanguinolenta o negra, de aspecto alquitranado.  ASEGRESE DE QUE:  Comprende estas instrucciones.  Controlar su afeccin.  Recibir ayuda de inmediato si no mejora o si empeora.  Esta informacin no tiene  como fin reemplazar el consejo del mdico. Asegrese de hacerle al mdico cualquier pregunta que tenga. Document Released: 10/26/2004  Document Revised: 01/21/2013 Document Reviewed: 12/11/2012 Elsevier Interactive Patient Education  2017 [employer].

## 2016-10-05 NOTE — Assessment & Plan Note (Signed)
Patient returns with continued abdominal pain that has now localized to the LLQ, feels similar to diverticulitis flares she has had in the past. Is overall well appearing and has been tolerating po at home so is appropriate for outpatient management. - Cipro and flagyl for 7 days - advised miralax for constipatio - Discussed return precautions at length. If not improvement and still well appearing will call back for CT abd. If worsens, cannot tolerate po, fevers, then patient will go to ER. Patient and husband voiced good understanding

## 2016-10-19 ENCOUNTER — Ambulatory Visit (INDEPENDENT_AMBULATORY_CARE_PROVIDER_SITE_OTHER): Payer: Medicare Other | Admitting: *Deleted

## 2016-10-19 DIAGNOSIS — J4550 Severe persistent asthma, uncomplicated: Secondary | ICD-10-CM

## 2016-10-19 MED ORDER — BENRALIZUMAB 30 MG/ML ~~LOC~~ SOSY
30.0000 mg | PREFILLED_SYRINGE | SUBCUTANEOUS | Status: AC
  Administered 2016-10-19 – 2024-02-05 (×46): 30 mg via SUBCUTANEOUS

## 2016-11-23 ENCOUNTER — Ambulatory Visit (INDEPENDENT_AMBULATORY_CARE_PROVIDER_SITE_OTHER): Payer: Medicare Other | Admitting: *Deleted

## 2016-11-23 DIAGNOSIS — Z23 Encounter for immunization: Secondary | ICD-10-CM

## 2016-12-14 ENCOUNTER — Ambulatory Visit: Payer: Medicare Other

## 2016-12-20 ENCOUNTER — Ambulatory Visit (INDEPENDENT_AMBULATORY_CARE_PROVIDER_SITE_OTHER): Payer: Medicare Other

## 2016-12-20 DIAGNOSIS — J4550 Severe persistent asthma, uncomplicated: Secondary | ICD-10-CM | POA: Diagnosis not present

## 2016-12-22 ENCOUNTER — Ambulatory Visit: Payer: Self-pay

## 2017-02-07 ENCOUNTER — Ambulatory Visit: Payer: Self-pay

## 2017-02-20 ENCOUNTER — Telehealth: Payer: Self-pay | Admitting: *Deleted

## 2017-02-20 ENCOUNTER — Ambulatory Visit (INDEPENDENT_AMBULATORY_CARE_PROVIDER_SITE_OTHER): Payer: Medicare Other | Admitting: *Deleted

## 2017-02-20 DIAGNOSIS — J4550 Severe persistent asthma, uncomplicated: Secondary | ICD-10-CM | POA: Diagnosis not present

## 2017-02-20 NOTE — Telephone Encounter (Signed)
error 

## 2017-03-22 ENCOUNTER — Encounter: Payer: Medicare Other | Admitting: Family Medicine

## 2017-04-17 ENCOUNTER — Ambulatory Visit: Payer: Self-pay

## 2017-04-19 ENCOUNTER — Ambulatory Visit (INDEPENDENT_AMBULATORY_CARE_PROVIDER_SITE_OTHER): Payer: Medicare Other

## 2017-04-19 DIAGNOSIS — J4550 Severe persistent asthma, uncomplicated: Secondary | ICD-10-CM | POA: Diagnosis not present

## 2017-04-20 ENCOUNTER — Ambulatory Visit (INDEPENDENT_AMBULATORY_CARE_PROVIDER_SITE_OTHER): Payer: Medicare Other | Admitting: Family Medicine

## 2017-04-20 ENCOUNTER — Other Ambulatory Visit: Payer: Self-pay

## 2017-04-20 ENCOUNTER — Encounter: Payer: Self-pay | Admitting: Family Medicine

## 2017-04-20 ENCOUNTER — Ambulatory Visit
Admission: RE | Admit: 2017-04-20 | Discharge: 2017-04-20 | Disposition: A | Discharge status: Home / Self Care | Payer: Medicare Other | Source: Ambulatory Visit | Attending: Family Medicine | Admitting: Family Medicine

## 2017-04-20 VITALS — BP 102/78 | HR 57 | Temp 97.9°F | Ht 59.0 in | Wt 127.4 lb

## 2017-04-20 DIAGNOSIS — Z0001 Encounter for general adult medical examination with abnormal findings: Secondary | ICD-10-CM | POA: Diagnosis not present

## 2017-04-20 DIAGNOSIS — R1030 Lower abdominal pain, unspecified: Secondary | ICD-10-CM | POA: Diagnosis not present

## 2017-04-20 DIAGNOSIS — Z0000 Encounter for general adult medical examination without abnormal findings: Secondary | ICD-10-CM | POA: Diagnosis not present

## 2017-04-20 DIAGNOSIS — M542 Cervicalgia: Secondary | ICD-10-CM

## 2017-04-20 DIAGNOSIS — E875 Hyperkalemia: Secondary | ICD-10-CM

## 2017-04-20 DIAGNOSIS — Z87448 Personal history of other diseases of urinary system: Secondary | ICD-10-CM

## 2017-04-20 MED ORDER — TETANUS-DIPHTH-ACELL PERTUSSIS 5-2-15.5 LF-MCG/0.5 IM SUSP: 0.5000 mL | Freq: Once | INTRAMUSCULAR | 0 refills | Status: AC

## 2017-04-20 NOTE — Patient Instructions (Addendum)
It was good to see you today!  We are checking some labs today.  I will call you when they have returned.  This will help with decisions.  I am also sending your for an xray of your neck.  Go to [facility].  You do not need an appointment.    [facility] will [facility] for a [facility].    Take the tetanus prescription to the pharmacy, and they will give you the shot.
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
## 2017-04-21 LAB — CBC WITH DIFFERENTIAL/PLATELET
BASOS: 0 %
Basophils Absolute: 0 10*3/uL (ref 0.0–0.2)
EOS (ABSOLUTE): 0 10*3/uL (ref 0.0–0.4)
Eos: 0 %
HEMATOCRIT: 46.2 % (ref 34.0–46.6)
Hemoglobin: 15 g/dL (ref 11.1–15.9)
IMMATURE GRANS (ABS): 0 10*3/uL (ref 0.0–0.1)
IMMATURE GRANULOCYTES: 0 %
Lymphocytes Absolute: 1.4 10*3/uL (ref 0.7–3.1)
Lymphs: 28 %
MCH: 29 pg (ref 26.6–33.0)
MCHC: 32.5 g/dL (ref 31.5–35.7)
MCV: 89 fL (ref 79–97)
Monocytes Absolute: 0.4 10*3/uL (ref 0.1–0.9)
Monocytes: 8 %
NEUTROS PCT: 64 %
Neutrophils Absolute: 3.4 10*3/uL (ref 1.4–7.0)
PLATELETS: 258 10*3/uL (ref 150–379)
RBC: 5.17 x10E6/uL (ref 3.77–5.28)
RDW: 14 % (ref 12.3–15.4)
WBC: 5.2 10*3/uL (ref 3.4–10.8)

## 2017-04-21 LAB — LIPID PANEL
CHOL/HDL RATIO: 5.6 ratio — AB (ref 0.0–4.4)
Cholesterol, Total: 225 mg/dL — ABNORMAL HIGH (ref 100–199)
HDL: 40 mg/dL (ref >39)
LDL CALC: 150 mg/dL — AB (ref 0–99)
Triglycerides: 177 mg/dL — ABNORMAL HIGH (ref 0–149)
VLDL Cholesterol Cal: 35 mg/dL (ref 5–40)

## 2017-04-21 LAB — COMPREHENSIVE METABOLIC PANEL
A/G RATIO: 1.8 (ref 1.2–2.2)
ALT: 33 IU/L — AB (ref 0–32)
AST: 33 IU/L (ref 0–40)
Albumin: 4.8 g/dL (ref 3.5–4.8)
Alkaline Phosphatase: 112 IU/L (ref 39–117)
BUN/Creatinine Ratio: 17 (ref 12–28)
BUN: 13 mg/dL (ref 8–27)
Bilirubin Total: 0.6 mg/dL (ref 0.0–1.2)
CALCIUM: 10 mg/dL (ref 8.7–10.3)
CO2: 24 mmol/L (ref 20–29)
Chloride: 102 mmol/L (ref 96–106)
Creatinine, Ser: 0.76 mg/dL (ref 0.57–1.00)
GFR calc Af Amer: 88 mL/min/{1.73_m2} (ref >59)
GFR, EST NON AFRICAN AMERICAN: 76 mL/min/{1.73_m2} (ref >59)
GLOBULIN, TOTAL: 2.7 g/dL (ref 1.5–4.5)
Glucose: 85 mg/dL (ref 65–99)
POTASSIUM: 5.3 mmol/L — AB (ref 3.5–5.2)
Sodium: 142 mmol/L (ref 134–144)
TOTAL PROTEIN: 7.5 g/dL (ref 6.0–8.5)

## 2017-04-22 LAB — URINE CULTURE

## 2017-04-23 ENCOUNTER — Encounter: Payer: Self-pay | Admitting: Family Medicine

## 2017-04-23 NOTE — Addendum Note (Signed)
Addended byMingo Amber, Kayleen Memos on: 04/23/2017 05:10 PM   Modules accepted: Orders

## 2017-04-24 ENCOUNTER — Ambulatory Visit
Admission: RE | Admit: 2017-04-24 | Discharge: 2017-04-24 | Disposition: A | Discharge status: Home / Self Care | Payer: Medicare Other | Source: Ambulatory Visit | Attending: Family Medicine | Admitting: Family Medicine

## 2017-04-24 DIAGNOSIS — Z1231 Encounter for screening mammogram for malignant neoplasm of breast: Secondary | ICD-10-CM | POA: Diagnosis not present

## 2017-04-24 DIAGNOSIS — Z0000 Encounter for general adult medical examination without abnormal findings: Secondary | ICD-10-CM

## 2017-06-14 ENCOUNTER — Ambulatory Visit (INDEPENDENT_AMBULATORY_CARE_PROVIDER_SITE_OTHER): Payer: Medicare Other

## 2017-06-14 DIAGNOSIS — J4550 Severe persistent asthma, uncomplicated: Secondary | ICD-10-CM | POA: Diagnosis not present

## 2017-06-26 DIAGNOSIS — H353131 Nonexudative age-related macular degeneration, bilateral, early dry stage: Secondary | ICD-10-CM | POA: Diagnosis not present

## 2017-06-26 DIAGNOSIS — H02054 Trichiasis without entropian left upper eyelid: Secondary | ICD-10-CM | POA: Diagnosis not present

## 2017-07-25 DIAGNOSIS — H02054 Trichiasis without entropian left upper eyelid: Secondary | ICD-10-CM | POA: Diagnosis not present

## 2017-08-09 ENCOUNTER — Ambulatory Visit: Payer: Self-pay

## 2017-08-16 ENCOUNTER — Ambulatory Visit (INDEPENDENT_AMBULATORY_CARE_PROVIDER_SITE_OTHER): Payer: Medicare Other | Admitting: *Deleted

## 2017-08-16 DIAGNOSIS — J4550 Severe persistent asthma, uncomplicated: Secondary | ICD-10-CM

## 2017-09-10 DIAGNOSIS — H02054 Trichiasis without entropian left upper eyelid: Secondary | ICD-10-CM | POA: Diagnosis not present

## 2017-09-12 ENCOUNTER — Ambulatory Visit (INDEPENDENT_AMBULATORY_CARE_PROVIDER_SITE_OTHER): Payer: Medicare Other | Admitting: Family Medicine

## 2017-09-12 ENCOUNTER — Other Ambulatory Visit: Payer: Self-pay

## 2017-09-12 ENCOUNTER — Encounter: Payer: Self-pay | Admitting: Family Medicine

## 2017-09-12 VITALS — BP 146/78 | HR 69 | Temp 98.4°F | Wt 131.6 lb

## 2017-09-12 DIAGNOSIS — M19012 Primary osteoarthritis, left shoulder: Secondary | ICD-10-CM

## 2017-09-12 DIAGNOSIS — R400 Somnolence: Secondary | ICD-10-CM

## 2017-09-12 HISTORY — DX: Primary osteoarthritis, left shoulder: M19.012

## 2017-09-12 NOTE — Patient Instructions (Addendum)
It was very good to see you again today!  Make sure you are doing the arm circles, arm raises, wall crawls like we talked about.  The sleepiness should resolve by the end of this week.  If not let me know.  The same with the headaches.  Come back and see me in 1 month about your shoulder.

## 2017-09-12 NOTE — Assessment & Plan Note (Signed)
Likely rotator cuff tear.  PT exercises provided.  FU in 4 weeks to assess for improvement.  Heat for relief.   Consider corticosteroid shot at that visit if still with pain.

## 2017-09-12 NOTE — Assessment & Plan Note (Signed)
Secondary to sedating medications.  She will contact eye doctor's office to obtain what the medication was and the dosage. Feels very well today.  Nothing concerning by history or physical exam.  If persists, contact us.  If headache recurs use ibuprofen (sparingly due to age)
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
## 2017-10-12 ENCOUNTER — Ambulatory Visit: Payer: Medicare Other

## 2017-10-16 ENCOUNTER — Ambulatory Visit: Payer: Medicare Other | Admitting: Family Medicine

## 2017-10-29 ENCOUNTER — Ambulatory Visit (INDEPENDENT_AMBULATORY_CARE_PROVIDER_SITE_OTHER): Payer: Medicare Other | Admitting: *Deleted

## 2017-10-29 DIAGNOSIS — J4550 Severe persistent asthma, uncomplicated: Secondary | ICD-10-CM

## 2017-10-31 ENCOUNTER — Ambulatory Visit (INDEPENDENT_AMBULATORY_CARE_PROVIDER_SITE_OTHER): Payer: Medicare Other | Admitting: *Deleted

## 2017-10-31 DIAGNOSIS — Z23 Encounter for immunization: Secondary | ICD-10-CM

## 2017-11-29 DIAGNOSIS — H02054 Trichiasis without entropian left upper eyelid: Secondary | ICD-10-CM | POA: Diagnosis not present

## 2017-12-25 ENCOUNTER — Telehealth: Payer: Self-pay | Admitting: *Deleted

## 2017-12-25 ENCOUNTER — Encounter: Payer: Self-pay | Admitting: Allergy and Immunology

## 2017-12-25 ENCOUNTER — Telehealth: Payer: Self-pay

## 2017-12-25 ENCOUNTER — Ambulatory Visit: Payer: Medicare Other | Admitting: Allergy and Immunology

## 2017-12-25 VITALS — BP 144/76 | HR 80 | Resp 18 | Ht 59.0 in | Wt 127.0 lb

## 2017-12-25 DIAGNOSIS — H6992 Unspecified Eustachian tube disorder, left ear: Secondary | ICD-10-CM

## 2017-12-25 DIAGNOSIS — D721 Eosinophilia, unspecified: Secondary | ICD-10-CM

## 2017-12-25 DIAGNOSIS — K219 Gastro-esophageal reflux disease without esophagitis: Secondary | ICD-10-CM | POA: Diagnosis not present

## 2017-12-25 DIAGNOSIS — J4550 Severe persistent asthma, uncomplicated: Secondary | ICD-10-CM

## 2017-12-25 DIAGNOSIS — J3089 Other allergic rhinitis: Secondary | ICD-10-CM

## 2017-12-25 DIAGNOSIS — H6982 Other specified disorders of Eustachian tube, left ear: Secondary | ICD-10-CM | POA: Diagnosis not present

## 2017-12-25 MED ORDER — OMEPRAZOLE 40 MG PO CPDR: 40.0000 mg | DELAYED_RELEASE_CAPSULE | Freq: Every day | ORAL | 5 refills | Status: DC

## 2017-12-25 NOTE — Telephone Encounter (Signed)
Please refer patient per Dr Neldon Mc to ENT about left hearing loss

## 2017-12-25 NOTE — Telephone Encounter (Signed)
Dr. Neldon Mc would like the patient to see ENT for hearing loss.

## 2017-12-25 NOTE — Patient Instructions (Signed)
  1. Continue to perform Allergen avoidance measures as best as possible  2. Continue Benralizumab injections (EpiPen)  3. Treat and prevent reflux:   A. decrease caffeine consumption at least 50%  B. start omeprazole 40 mg tablet in a.m.  4. If needed:   A. albuterol nebulization or ProAir HFA 2 puffs every 4-6 hours  B. OTC cetirizine 10 mg tablet 1 time per day  5. Visit with ENT about left hearing loss   6. Return to clinic in 6 months or earlier if problem
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
## 2017-12-26 ENCOUNTER — Encounter: Payer: Self-pay | Admitting: Allergy and Immunology

## 2017-12-26 NOTE — Telephone Encounter (Signed)
Referral placed to Dr Velvet Bathe Office.

## 2018-02-05 NOTE — Telephone Encounter (Signed)
Patient is down for 02/15/2018

## 2018-02-15 DIAGNOSIS — H9312 Tinnitus, left ear: Secondary | ICD-10-CM | POA: Diagnosis not present

## 2018-02-15 DIAGNOSIS — H903 Sensorineural hearing loss, bilateral: Secondary | ICD-10-CM | POA: Diagnosis not present

## 2018-02-19 ENCOUNTER — Ambulatory Visit (INDEPENDENT_AMBULATORY_CARE_PROVIDER_SITE_OTHER): Payer: Medicare Other | Admitting: *Deleted

## 2018-02-19 ENCOUNTER — Other Ambulatory Visit: Payer: Self-pay | Admitting: Otolaryngology

## 2018-02-19 DIAGNOSIS — H918X9 Other specified hearing loss, unspecified ear: Secondary | ICD-10-CM

## 2018-02-19 DIAGNOSIS — J4550 Severe persistent asthma, uncomplicated: Secondary | ICD-10-CM | POA: Diagnosis not present

## 2018-02-27 ENCOUNTER — Ambulatory Visit
Admission: RE | Admit: 2018-02-27 | Discharge: 2018-02-27 | Disposition: A | Discharge status: Home / Self Care | Payer: Medicare Other | Source: Ambulatory Visit | Attending: Otolaryngology | Admitting: Otolaryngology

## 2018-02-27 DIAGNOSIS — H918X9 Other specified hearing loss, unspecified ear: Secondary | ICD-10-CM

## 2018-02-27 DIAGNOSIS — H9190 Unspecified hearing loss, unspecified ear: Secondary | ICD-10-CM | POA: Diagnosis not present

## 2018-02-27 MED ORDER — GADOBENATE DIMEGLUMINE 529 MG/ML IV SOLN
10.0000 mL | Freq: Once | INTRAVENOUS | Status: AC | PRN
  Administered 2018-02-27: 10 mL via INTRAVENOUS

## 2018-03-06 ENCOUNTER — Other Ambulatory Visit: Payer: Self-pay | Admitting: Otolaryngology

## 2018-03-06 DIAGNOSIS — I729 Aneurysm of unspecified site: Secondary | ICD-10-CM

## 2018-03-07 ENCOUNTER — Ambulatory Visit
Admission: RE | Admit: 2018-03-07 | Discharge: 2018-03-07 | Disposition: A | Discharge status: Home / Self Care | Payer: Medicare Other | Source: Ambulatory Visit | Attending: Otolaryngology | Admitting: Otolaryngology

## 2018-03-07 DIAGNOSIS — I729 Aneurysm of unspecified site: Secondary | ICD-10-CM

## 2018-03-07 DIAGNOSIS — G939 Disorder of brain, unspecified: Secondary | ICD-10-CM | POA: Diagnosis not present

## 2018-03-13 ENCOUNTER — Other Ambulatory Visit: Payer: Self-pay | Admitting: Otolaryngology

## 2018-03-13 DIAGNOSIS — I729 Aneurysm of unspecified site: Secondary | ICD-10-CM

## 2018-03-13 DIAGNOSIS — I999 Unspecified disorder of circulatory system: Secondary | ICD-10-CM

## 2018-03-28 ENCOUNTER — Ambulatory Visit
Admission: RE | Admit: 2018-03-28 | Discharge: 2018-03-28 | Disposition: A | Discharge status: Home / Self Care | Payer: Medicare Other | Source: Ambulatory Visit | Attending: Otolaryngology | Admitting: Otolaryngology

## 2018-03-28 DIAGNOSIS — I729 Aneurysm of unspecified site: Secondary | ICD-10-CM

## 2018-03-28 DIAGNOSIS — H9319 Tinnitus, unspecified ear: Secondary | ICD-10-CM | POA: Diagnosis not present

## 2018-03-28 DIAGNOSIS — I999 Unspecified disorder of circulatory system: Secondary | ICD-10-CM

## 2018-03-28 MED ORDER — IOPAMIDOL (ISOVUE-370) INJECTION 76%
75.0000 mL | Freq: Once | INTRAVENOUS | Status: AC | PRN
  Administered 2018-03-28: 75 mL via INTRAVENOUS

## 2018-05-14 ENCOUNTER — Ambulatory Visit (INDEPENDENT_AMBULATORY_CARE_PROVIDER_SITE_OTHER): Payer: Medicare Other

## 2018-05-14 ENCOUNTER — Ambulatory Visit: Payer: Medicare Other

## 2018-05-14 DIAGNOSIS — J4550 Severe persistent asthma, uncomplicated: Secondary | ICD-10-CM | POA: Diagnosis not present

## 2018-06-25 ENCOUNTER — Encounter: Payer: Self-pay | Admitting: Allergy and Immunology

## 2018-06-25 ENCOUNTER — Ambulatory Visit: Payer: Medicare Other | Admitting: Allergy and Immunology

## 2018-06-25 ENCOUNTER — Other Ambulatory Visit: Payer: Self-pay

## 2018-06-25 VITALS — BP 130/82 | HR 76 | Resp 16

## 2018-06-25 DIAGNOSIS — J3089 Other allergic rhinitis: Secondary | ICD-10-CM | POA: Diagnosis not present

## 2018-06-25 DIAGNOSIS — K219 Gastro-esophageal reflux disease without esophagitis: Secondary | ICD-10-CM

## 2018-06-25 DIAGNOSIS — J4550 Severe persistent asthma, uncomplicated: Secondary | ICD-10-CM | POA: Diagnosis not present

## 2018-06-25 DIAGNOSIS — D721 Eosinophilia, unspecified: Secondary | ICD-10-CM

## 2018-06-25 NOTE — Patient Instructions (Signed)
  1. Continue Benralizumab injections (EpiPen)  2. Treat and prevent reflux:   A. decrease caffeine consumption at least 50%  3. If needed:   A. albuterol nebulization or ProAir HFA 2 puffs every 4-6 hours  B. OTC cetirizine 10 mg tablet 1 time per day  4. Return to clinic in 6 months or earlier if problem
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
## 2018-06-26 ENCOUNTER — Encounter: Payer: Self-pay | Admitting: Allergy and Immunology

## 2018-06-27 DIAGNOSIS — H353131 Nonexudative age-related macular degeneration, bilateral, early dry stage: Secondary | ICD-10-CM | POA: Diagnosis not present

## 2018-06-27 DIAGNOSIS — H26492 Other secondary cataract, left eye: Secondary | ICD-10-CM | POA: Diagnosis not present

## 2018-06-27 DIAGNOSIS — H02054 Trichiasis without entropian left upper eyelid: Secondary | ICD-10-CM | POA: Diagnosis not present

## 2018-06-27 DIAGNOSIS — H26491 Other secondary cataract, right eye: Secondary | ICD-10-CM | POA: Diagnosis not present

## 2018-07-08 ENCOUNTER — Ambulatory Visit (INDEPENDENT_AMBULATORY_CARE_PROVIDER_SITE_OTHER): Payer: Medicare Other | Admitting: Student in an Organized Health Care Education/Training Program

## 2018-07-08 ENCOUNTER — Other Ambulatory Visit: Payer: Self-pay

## 2018-07-08 ENCOUNTER — Telehealth: Payer: Self-pay

## 2018-07-08 VITALS — BP 122/62 | HR 79 | Ht 59.0 in | Wt 122.5 lb

## 2018-07-08 DIAGNOSIS — Z0000 Encounter for general adult medical examination without abnormal findings: Secondary | ICD-10-CM

## 2018-07-08 DIAGNOSIS — K5732 Diverticulitis of large intestine without perforation or abscess without bleeding: Secondary | ICD-10-CM | POA: Diagnosis not present

## 2018-07-08 DIAGNOSIS — R011 Cardiac murmur, unspecified: Secondary | ICD-10-CM | POA: Diagnosis not present

## 2018-07-08 DIAGNOSIS — R29898 Other symptoms and signs involving the musculoskeletal system: Secondary | ICD-10-CM

## 2018-07-08 DIAGNOSIS — M5410 Radiculopathy, site unspecified: Secondary | ICD-10-CM

## 2018-07-08 HISTORY — DX: Radiculopathy, site unspecified: M54.10

## 2018-07-08 HISTORY — DX: Cardiac murmur, unspecified: R01.1

## 2018-07-08 LAB — POCT GLYCOSYLATED HEMOGLOBIN (HGB A1C): Hemoglobin A1C: 5.5 % (ref 4.0–5.6)

## 2018-07-08 MED ORDER — GABAPENTIN 100 MG PO CAPS: 100.0000 mg | ORAL_CAPSULE | Freq: Every day | ORAL | 0 refills | Status: DC

## 2018-07-08 NOTE — Assessment & Plan Note (Signed)
Patient's husband with new diagnosis of diabetes and is concerned his wife has it too because they share the same diet. Test Hgb A1c at their request although patient is asymptomatic and had normal blood glucose at last visit.  Hgb A1c at this time is 5.5 and not in diabetic range. Informed patient.   We also discussed her hyperlipidemia and discussed the risks/benefits of taking a medication to correct this as she is >80 years old and no history of heart dz or stroke. We made a shared decision to not start medical therapy at this time. Will continue physical activity and healthy diet

## 2018-07-08 NOTE — Patient Instructions (Addendum)
It was a pleasure to see you today!  Mucho Gusto! To summarize our discussion for this visit:  We will check your blood sugar today for diabetes  We will try a new medication to help with nerve pain called gabapentin. It can make you sleepy so I recommend taking it at night.  I will send in a new referral for you to see GI doctor   Some additional health maintenance measures we should update are: . Consider a mammogram   Please return to our clinic to see me in 1 month for follow up.  Call the clinic at [phone] if your symptoms worsen or you have any concerns.  Thank you for allowing me to take part in your care,  Dr. Doristine Mango   Thanks for choosing [facility] for your primary care.

## 2018-07-08 NOTE — Assessment & Plan Note (Signed)
Recognized very mild holosystolic murmur on exam. Patient is asymptomatic. Vitals wnl. Informed patient and husband of this finding and discussed signs to look out for including syncope and chest pain Will continue to monitor

## 2018-07-08 NOTE — Assessment & Plan Note (Signed)
Patient endorses symptoms of flare up about a month ago but was not seen. Has been well controlled for the last couple years. Would like to go back to GI doctor for further evaluation Sent referral

## 2018-07-08 NOTE — Telephone Encounter (Signed)
Patient informed. [place] ID # [id number].  Anne Warren, Anne Warren

## 2018-07-08 NOTE — Assessment & Plan Note (Signed)
Hx and px consistent with radiculopathy of LE bilateral. Imaging in past showed arthritis and stenosis in spinal canal. Negative for red flag symptoms Prescribing gabapentin low dose for nerve pain to take at night. Counseled on possible sedation. Referred for physical therapy See back in a couple months
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
## 2018-07-09 ENCOUNTER — Ambulatory Visit (INDEPENDENT_AMBULATORY_CARE_PROVIDER_SITE_OTHER): Payer: Medicare Other | Admitting: *Deleted

## 2018-07-09 DIAGNOSIS — J4550 Severe persistent asthma, uncomplicated: Secondary | ICD-10-CM | POA: Diagnosis not present

## 2018-07-12 DIAGNOSIS — H26491 Other secondary cataract, right eye: Secondary | ICD-10-CM | POA: Diagnosis not present

## 2018-07-12 DIAGNOSIS — H02054 Trichiasis without entropian left upper eyelid: Secondary | ICD-10-CM | POA: Diagnosis not present

## 2018-07-18 ENCOUNTER — Encounter: Payer: Self-pay | Admitting: Physical Therapy

## 2018-07-18 ENCOUNTER — Other Ambulatory Visit: Payer: Self-pay

## 2018-07-18 ENCOUNTER — Ambulatory Visit: Payer: Medicare Other | Attending: Family Medicine | Admitting: Physical Therapy

## 2018-07-18 DIAGNOSIS — R262 Difficulty in walking, not elsewhere classified: Secondary | ICD-10-CM

## 2018-07-18 DIAGNOSIS — M79671 Pain in right foot: Secondary | ICD-10-CM | POA: Diagnosis not present

## 2018-07-18 DIAGNOSIS — M79672 Pain in left foot: Secondary | ICD-10-CM | POA: Diagnosis not present
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
## 2018-07-24 ENCOUNTER — Ambulatory Visit: Payer: Medicare Other | Admitting: Physical Therapy

## 2018-07-24 ENCOUNTER — Encounter: Payer: Self-pay | Admitting: Physical Therapy

## 2018-07-24 ENCOUNTER — Other Ambulatory Visit: Payer: Self-pay

## 2018-07-24 DIAGNOSIS — R262 Difficulty in walking, not elsewhere classified: Secondary | ICD-10-CM | POA: Diagnosis not present

## 2018-07-24 DIAGNOSIS — M79672 Pain in left foot: Secondary | ICD-10-CM | POA: Diagnosis not present

## 2018-07-24 DIAGNOSIS — M79671 Pain in right foot: Secondary | ICD-10-CM
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
## 2018-07-29 ENCOUNTER — Encounter: Payer: Self-pay | Admitting: Physical Therapy

## 2018-07-29 ENCOUNTER — Other Ambulatory Visit: Payer: Self-pay

## 2018-07-29 ENCOUNTER — Ambulatory Visit: Payer: Medicare Other | Admitting: Physical Therapy

## 2018-07-29 DIAGNOSIS — R262 Difficulty in walking, not elsewhere classified: Secondary | ICD-10-CM

## 2018-07-29 DIAGNOSIS — M79672 Pain in left foot: Secondary | ICD-10-CM | POA: Diagnosis not present

## 2018-07-29 DIAGNOSIS — M79671 Pain in right foot: Secondary | ICD-10-CM
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
## 2018-08-05 ENCOUNTER — Ambulatory Visit: Payer: Medicare Other | Admitting: Physical Therapy

## 2018-08-13 ENCOUNTER — Ambulatory Visit: Payer: Medicare Other | Admitting: Physical Therapy

## 2018-08-20 ENCOUNTER — Encounter: Payer: Self-pay | Admitting: Physical Therapy

## 2018-08-20 ENCOUNTER — Other Ambulatory Visit: Payer: Self-pay

## 2018-08-20 ENCOUNTER — Ambulatory Visit: Payer: Medicare Other | Attending: Family Medicine | Admitting: Physical Therapy

## 2018-08-20 DIAGNOSIS — M79672 Pain in left foot: Secondary | ICD-10-CM | POA: Diagnosis not present

## 2018-08-20 DIAGNOSIS — R262 Difficulty in walking, not elsewhere classified: Secondary | ICD-10-CM | POA: Insufficient documentation

## 2018-08-20 DIAGNOSIS — M79671 Pain in right foot: Secondary | ICD-10-CM | POA: Diagnosis not present
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
## 2018-08-23 ENCOUNTER — Encounter: Payer: Self-pay | Admitting: Physician Assistant

## 2018-08-27 ENCOUNTER — Ambulatory Visit: Payer: Medicare Other | Admitting: Physical Therapy

## 2018-09-03 ENCOUNTER — Ambulatory Visit (INDEPENDENT_AMBULATORY_CARE_PROVIDER_SITE_OTHER): Payer: Medicare Other | Admitting: *Deleted

## 2018-09-03 DIAGNOSIS — J4550 Severe persistent asthma, uncomplicated: Secondary | ICD-10-CM | POA: Diagnosis not present

## 2018-09-12 ENCOUNTER — Other Ambulatory Visit: Payer: Self-pay

## 2018-09-12 ENCOUNTER — Ambulatory Visit (INDEPENDENT_AMBULATORY_CARE_PROVIDER_SITE_OTHER): Payer: Medicare Other | Admitting: Physician Assistant

## 2018-09-12 ENCOUNTER — Other Ambulatory Visit (INDEPENDENT_AMBULATORY_CARE_PROVIDER_SITE_OTHER): Payer: Medicare Other

## 2018-09-12 ENCOUNTER — Encounter: Payer: Self-pay | Admitting: Physician Assistant

## 2018-09-12 VITALS — BP 140/80 | HR 76 | Temp 98.3°F | Ht 59.0 in | Wt 128.0 lb

## 2018-09-12 DIAGNOSIS — R1319 Other dysphagia: Secondary | ICD-10-CM | POA: Diagnosis not present

## 2018-09-12 DIAGNOSIS — R1032 Left lower quadrant pain: Secondary | ICD-10-CM | POA: Diagnosis not present

## 2018-09-12 DIAGNOSIS — Z8719 Personal history of other diseases of the digestive system: Secondary | ICD-10-CM

## 2018-09-12 DIAGNOSIS — K219 Gastro-esophageal reflux disease without esophagitis: Secondary | ICD-10-CM

## 2018-09-12 DIAGNOSIS — R1011 Right upper quadrant pain: Secondary | ICD-10-CM | POA: Diagnosis not present

## 2018-09-12 LAB — COMPREHENSIVE METABOLIC PANEL
ALT: 28 U/L (ref 0–35)
AST: 31 U/L (ref 0–37)
Albumin: 4.4 g/dL (ref 3.5–5.2)
Alkaline Phosphatase: 88 U/L (ref 39–117)
BUN: 16 mg/dL (ref 6–23)
CO2: 28 mEq/L (ref 19–32)
Calcium: 9.6 mg/dL (ref 8.4–10.5)
Chloride: 105 mEq/L (ref 96–112)
Creatinine, Ser: 0.7 mg/dL (ref 0.40–1.20)
GFR: 80.77 mL/min (ref >60.00)
Glucose, Bld: 101 mg/dL — ABNORMAL HIGH (ref 70–99)
Potassium: 4 mEq/L (ref 3.5–5.1)
Sodium: 140 mEq/L (ref 135–145)
Total Bilirubin: 0.7 mg/dL (ref 0.2–1.2)
Total Protein: 7.3 g/dL (ref 6.0–8.3)

## 2018-09-12 LAB — CBC WITH DIFFERENTIAL/PLATELET
Basophils Absolute: 0 10*3/uL (ref 0.0–0.1)
Basophils Relative: 0.3 % (ref 0.0–3.0)
Eosinophils Absolute: 0 10*3/uL (ref 0.0–0.7)
Eosinophils Relative: 0 % (ref 0.0–5.0)
HCT: 41.9 % (ref 36.0–46.0)
Hemoglobin: 14.1 g/dL (ref 12.0–15.0)
Lymphocytes Relative: 32.1 % (ref 12.0–46.0)
Lymphs Abs: 2 10*3/uL (ref 0.7–4.0)
MCHC: 33.6 g/dL (ref 30.0–36.0)
MCV: 88.6 fl (ref 78.0–100.0)
Monocytes Absolute: 0.6 10*3/uL (ref 0.1–1.0)
Monocytes Relative: 9.3 % (ref 3.0–12.0)
Neutro Abs: 3.6 10*3/uL (ref 1.4–7.7)
Neutrophils Relative %: 58.3 % (ref 43.0–77.0)
Platelets: 219 10*3/uL (ref 150.0–400.0)
RBC: 4.73 Mil/uL (ref 3.87–5.11)
RDW: 13.1 % (ref 11.5–15.5)
WBC: 6.2 10*3/uL (ref 4.0–10.5)

## 2018-09-12 LAB — SEDIMENTATION RATE: Sed Rate: 18 mm/hr (ref 0–30)

## 2018-09-12 MED ORDER — OMEPRAZOLE 40 MG PO CPDR: 40.0000 mg | DELAYED_RELEASE_CAPSULE | ORAL | 11 refills | Status: DC

## 2018-09-12 NOTE — Progress Notes (Signed)
Assessment and plan reviewed.  Agree.  Amy Esterwood to review outside records, CT findings, and have patient follow-up with her regarding further plans

## 2018-09-12 NOTE — Progress Notes (Addendum)
Subjective:    Patient ID: Anne Warren, female    DOB: 02-22-1940, 78 y.o.   MRN: [id number]  HPI [name] is a pleasant 78 year old Hispanic female, new to GI today referred by [facility]  family practice for abdominal pain. Patient is non-English speaking, is accompanied by her husband to does speak some [place] and an interpreter. She has history of hypertension, GERD, cervical disc disease, hyperlipidemia, polycythemia, asthma and is status post cholecystectomy. She relates that she did have a prior colonoscopy done in [place] longer than 5 years ago and believes this may have been at [facility].  She had a remote EGD done in [place]. Patient's husband believes that she had been told that she had "colitis" in the past. Her current symptoms started about 4 months ago month ago with lower abdominal pain which has been constant and generally is in the left lower quadrant and associated with cramping.  She has not had any fever or chills.  Bowel movements have been somewhat constipated, no melena or hematochezia.  Appetite has been fine weight stable and no complaints of nausea. She also complains of heartburn and indigestion as well as sour brash type symptoms.  She states that she had her husband eat all fresh food and try to eat very healthy.  She has had some intermittent solid food dysphasia as well with sensation of food sitting in her esophagus at times.  She is not had any episodes of regurgitation, generally stops eating and drinks water or weights for the food to pass. Review of Epic records show he is she had a CT scan in 2015 with sigmoid diverticulitis with a contained perforation.  Review of Systems Pertinent positive and negative review of systems were noted in the above HPI section.  All other review of systems was otherwise negative.  Outpatient Encounter Medications as of 09/12/2018  Medication Sig  . albuterol (PROVENTIL) (2.5 MG/3ML) 0.083% nebulizer solution USE 1 VIAL IN  NEBULIZER EVERY 6 HOURS AS NEEDED FOR WHEEZING OR SHORTNESS OF BREATH  . EPINEPHrine 0.3 mg/0.3 mL IJ SOAJ injection Use as directed for severe allergic reaction  . gabapentin (NEURONTIN) 100 MG capsule Take 1 capsule (100 mg total) by mouth at bedtime.  . Multiple Vitamins-Minerals (MULTIVITAMIN WOMEN PO) Take by mouth.  Marland Kitchen PROAIR HFA 108 (90 Base) MCG/ACT inhaler INHALE 2 PUFFS BY MOUTH EVERY 4 HOURS AS NEEDED  . omeprazole (PRILOSEC) 40 MG capsule Take 1 capsule (40 mg total) by mouth every morning. Take 30 minute before breakfast  . [DISCONTINUED] omeprazole (PRILOSEC) 40 MG capsule Take 1 capsule (40 mg total) by mouth daily. (Patient not taking: Reported on 06/25/2018)   Facility-Administered Encounter Medications as of 09/12/2018  Medication  . Benralizumab SOSY 30 mg   Allergies  Allergen Reactions  . Atorvastatin Other (See Comments)    REACTION: Losing memory  . Simvastatin Other (See Comments)    REACTION: Losing memory  . Amitriptyline Hcl     REACTION: Felt bad when on this and HCTZ  . Fish Oil Nausea And Vomiting and Other (See Comments)    REACTION: and dizziness   Patient Active Problem List   Diagnosis Date Noted  . Newly recognized heart murmur 07/08/2018  . Radiculopathy 07/08/2018  . Sleepiness 09/12/2017  . Arthropathy of left shoulder 09/12/2017  . Generalized abdominal pain 10/03/2016  . Dry mouth 02/03/2016  . Health care maintenance 10/07/2015  . Dyspnea 09/01/2015  . Polycythemia, secondary 03/22/2015  . Estrogen deficiency 03/19/2015  .  Dizziness 03/19/2015  . Hematuria 11/18/2013  . Diverticulitis of colon 10/31/2013  . Episodic lightheadedness 10/22/2012  . Vitiligo 09/17/2012  . Other disorders of bone and cartilage(733.99) 04/04/2011  . Hyperlipidemia with target LDL less than 130 04/03/2011  . Cervical radiculopathy at C7 04/21/2010  . ALLERGIC RHINITIS, SEASONAL 11/09/2008  . Asthma with exacerbation 11/09/2008  . URINARY INCONTINENCE, STRESS  06/01/2008  . GERD 11/14/2007  . OSTEOARTHRITIS, CERVICAL SPINE 12/18/2006  . PSYCHOSEXUAL DYSFUNCTION UNSPECIFIED 12/04/2006  . TENSION HEADACHES, CHRONIC 11/27/2006  . DECREASED HEARING 11/27/2006  . Hypertension 11/27/2006   Social History   Socioeconomic History  . Marital status: Married    Spouse name: Lawanna Kobus  . Number of children: 1  . Years of education: 8  . Highest education level: Not on file  Occupational History  . Occupation: Retired- Retail banker: UNEMPLOYED  Social Needs  . Financial resource strain: Not on file  . Food insecurity    Worry: Not on file    Inability: Not on file  . Transportation needs    Medical: Not on file    Non-medical: Not on file  Tobacco Use  . Smoking status: Former Smoker    Packs/day: 0.10    Types: Cigarettes    Quit date: 01/30/1978    Years since quitting: 40.6  . Smokeless tobacco: Never Used  . Tobacco comment: 1 cig daily <1 year 12/11/14  Substance and Sexual Activity  . Alcohol use: Yes    Alcohol/week: 0.0 standard drinks  . Drug use: No  . Sexual activity: Yes  Lifestyle  . Physical activity    Days per week: Not on file    Minutes per session: Not on file  . Stress: Not on file  Relationships  . Social Musician on phone: Not on file    Gets together: Not on file    Attends religious service: Not on file    Active member of club or organization: Not on file    Attends meetings of clubs or organizations: Not on file    Relationship status: Not on file  . Intimate partner violence    Fear of current or ex partner: Not on file    Emotionally abused: Not on file    Physically abused: Not on file    Forced sexual activity: Not on file  Other Topics Concern  . Not on file  Social History Narrative   Native of [place]   Married to [place], native of [place], visits children of his first marriage there.   They have a daughter, who is a Clinical research associate in [place] area   Religion, [facility] Witness       Health Care POA: Primary: sister Elwin Mocha [phone] Secondary: Milderd Meager [phone]   Emergency Contact: Husband [name] [phone]   End of Life Plan: DNR     Who lives with you: Lives in 1 story home with husband.   Any pets: 0   Diet: Patient has a varied diet of protein, starch, and vegetables.  Patient tries not to eat red meat.    Exercise: Patient walks 2-3 times per week.  Pt has had problems with her asthma while exercising.    Seatbelts: Patient reports wearing seat belts when in vehicle.    Wynelle Link Exposure/Protection: Patient reports wearing sun protection.   Hobbies: Likes to read             Ms. Rian family history includes Breast cancer  in her sister; Cancer in her sister and sister; Depression in her sister; Heart attack in her father and mother; Heart disease in her mother; Stroke in her brother and father.      Objective:    Vitals:   09/12/18 0956  BP: 140/80  Pulse: 76  Temp: 98.3 F (36.8 C)    Physical Exam Well-developed well-nourished elderly Hispanic female in no acute distress.  Accompanied by her husband  height, Weight, 128 BMI 25.8   interpreter present  HEENT; nontraumatic normocephalic, EOMI, PER R LA, sclera anicteric. Oropharynx; not examined/wearing mask/COVID Neck; supple, no JVD Cardiovascular; regular rate and rhythm with S1-S2, no murmur rub or gallop Pulmonary; Clear bilaterally Abdomen; soft,  tender left lower quadrant  no guarding, he is also tender in the right upper quadrant, liver edge palpable 1 fingerbreadth below the right costal margin, nondistended, no palpable mass or hepatosplenomegaly, bowel sounds are active Rectal; not done today Skin; benign exam, no jaundice rash or appreciable lesions Extremities; no clubbing cyanosis or edema skin warm and dry Neuro/Psych; alert and oriented x4, grossly nonfocal mood and affect appropriate      Assessment & Plan:   #71 78 year old Hispanic female with  78-month history of lower abdominal pain primarily left-sided and crampy in nature.  This is been associated with some constipation. Patient does have prior history of diverticulitis.  Etiology of her current pain is not clear-rule out symptomatic diverticulosis, rule out segmental colitis associated with diverticular disease, doubt acute diverticulitis, rule out occult colon lesion  #2 chronic GERD with intermittent solid food dysphasia.  Rule out early esophageal stricture or ring. #3 status post cholecystectomy 4.  Hypertension 5.  Cervical disc disease 6.  Hyperlipidemia 7.  Polycythemia  Plan; we will check CBC with differential, c-Met, sed rate, Schedule for CT scan of the abdomen and pelvis with contrast. High-fiber diet. Start MiraLAX 17 g in 8 ounces of water daily, and patient asked to increase her water intake. Start omeprazole 40 mg p.o. every morning AC breakfast, prescription sent. Patient will sign release and will obtain her old records from [facility].  She may need colonoscopy and EGD but would like to review her old records and await CT. Patient will be established with Dr. Marina Goodell, will schedule follow-up office visit with myself in 2 to 3 weeks.  Apolo Cutshaw S Belkis Norbeck PA-C  09/12/2018   Addendum; records received from [facility], and reviewed patient had colonoscopy in August 2010 with Dr. Randa Evens with finding of diverticulosis of the sigmoid colon and small internal hemorrhoids.  10-year interval follow-up was recommended.  She had been seen there for diverticulitis/perforation and abscess in 2016.   No EGD.  Notes have been sent to be scanned.    Cc: Jamelle Rushing L, DO

## 2018-09-12 NOTE — Patient Instructions (Addendum)
If you are age 79 or older, your body mass index should be between 23-30. Your Body mass index is 25.85 kg/m. If this is out of the aforementioned range listed, please consider follow up with your Primary Care Provider.  If you are age 19 or younger, your body mass index should be between 19-25. Your Body mass index is 25.85 kg/m. If this is out of the aformentioned range listed, please consider follow up with your Primary Care Provider.   You have been scheduled for a CT scan of the abdomen and pelvis at [facility].  You are scheduled on 09/18/18 at 8 am. You should arrive 15 minutes prior to your appointment time for registration. Please follow the written instructions below on the day of your exam:  WARNING: IF YOU ARE ALLERGIC TO IODINE/X-RAY DYE, PLEASE NOTIFY RADIOLOGY IMMEDIATELY AT [phone]! YOU WILL BE GIVEN A 13 HOUR PREMEDICATION PREP.  1) Do not eat or drink anything after 4 am (4 hours prior to your test) 2) You have been given 2 bottles of oral contrast to drink. The solution may taste better if refrigerated, but do NOT add ice or any other liquid to this solution. Shake well before drinking.    Drink 1 bottle of contrast @ 6 am (2 hours prior to your exam)  Drink 1 bottle of contrast @ 7 am (1 hour prior to your exam)  You may take any medications as prescribed with a small amount of water, if necessary. If you take any of the following medications: METFORMIN, GLUCOPHAGE, GLUCOVANCE, AVANDAMET, RIOMET, FORTAMET, [place] MET, JANUMET, GLUMETZA or METAGLIP, you MAY be asked to HOLD this medication 48 hours AFTER the exam.  The purpose of you drinking the oral contrast is to aid in the visualization of your intestinal tract. The contrast solution may cause some diarrhea. Depending on your individual set of symptoms, you may also receive an intravenous injection of x-ray contrast/dye. Plan on being at [facility] for 30 minutes or longer, depending on the type of  exam you are having performed.  This test typically takes 30-45 minutes to complete.  If you have any questions regarding your exam or if you need to reschedule, you may call the CT department at [phone] between the hours of 8:00 am and 5:00 pm, Monday-Friday.  ______________________________________________________________  Your provider has requested that you go to the basement level for lab work before leaving today. Press "B" on the elevator. The lab is located at the first door on the left as you exit the elevator.  We have sent the following medications to your pharmacy for you to pick up at your convenience: Prilosec 40 mg  Start Miralax 17 gm in 8 ounces of water daily.  Start High Fiber Diet.  We are requesting records from [facility].  Follow up with me in 2-3 weeks.  Please call the office to make an appointment in a week or so as the schedule is not available at this time.  Thank you for choosing me and [facility].   Tye Savoy, NP

## 2018-09-18 ENCOUNTER — Ambulatory Visit (HOSPITAL_COMMUNITY)
Admission: RE | Admit: 2018-09-18 | Discharge: 2018-09-18 | Disposition: A | Discharge status: Home / Self Care | Payer: Medicare Other | Source: Ambulatory Visit | Attending: Physician Assistant | Admitting: Physician Assistant

## 2018-09-18 ENCOUNTER — Encounter (HOSPITAL_COMMUNITY): Payer: Self-pay

## 2018-09-18 ENCOUNTER — Other Ambulatory Visit: Payer: Self-pay

## 2018-09-18 DIAGNOSIS — K219 Gastro-esophageal reflux disease without esophagitis: Secondary | ICD-10-CM | POA: Insufficient documentation

## 2018-09-18 DIAGNOSIS — K5730 Diverticulosis of large intestine without perforation or abscess without bleeding: Secondary | ICD-10-CM | POA: Diagnosis not present

## 2018-09-18 DIAGNOSIS — Z8719 Personal history of other diseases of the digestive system: Secondary | ICD-10-CM | POA: Diagnosis not present

## 2018-09-18 DIAGNOSIS — R1319 Other dysphagia: Secondary | ICD-10-CM | POA: Insufficient documentation

## 2018-09-18 DIAGNOSIS — R1032 Left lower quadrant pain: Secondary | ICD-10-CM | POA: Diagnosis not present

## 2018-09-18 DIAGNOSIS — R1011 Right upper quadrant pain: Secondary | ICD-10-CM | POA: Insufficient documentation

## 2018-09-18 MED ORDER — SODIUM CHLORIDE (PF) 0.9 % IJ SOLN
INTRAMUSCULAR | Status: AC
  Filled 2018-09-18: qty 50

## 2018-09-18 MED ORDER — IOHEXOL 300 MG/ML  SOLN
100.0000 mL | Freq: Once | INTRAMUSCULAR | Status: AC | PRN
  Administered 2018-09-18: 09:00:00 100 mL via INTRAVENOUS

## 2018-09-20 ENCOUNTER — Telehealth: Payer: Self-pay | Admitting: *Deleted

## 2018-09-20 DIAGNOSIS — R29898 Other symptoms and signs involving the musculoskeletal system: Secondary | ICD-10-CM

## 2018-09-20 NOTE — Telephone Encounter (Signed)
NP calls to report the following PAD results:  Right leg: 0.67 Left leg: 0.97  To PCP  Christen Bame, CMA

## 2018-09-23 ENCOUNTER — Telehealth: Payer: Self-pay | Admitting: Physician Assistant

## 2018-09-25 ENCOUNTER — Other Ambulatory Visit: Payer: Self-pay

## 2018-09-25 MED ORDER — AMOXICILLIN-POT CLAVULANATE 875-125 MG PO TABS: 1.0000 | ORAL_TABLET | Freq: Two times a day (BID) | ORAL | 0 refills | Status: DC

## 2018-09-25 NOTE — Telephone Encounter (Signed)
Spouse called and spoke to another [facility] in the office and scheduled a follow up office visit w/Amy Lavonia on 10-08-2018. I tried calling Silvina numerous times and was not able to reach her. She has a DPR in place to only speak to pt.

## 2018-09-25 NOTE — Telephone Encounter (Signed)
Reached patient and discussed her results. She will start antibiotics today.

## 2018-10-02 ENCOUNTER — Ambulatory Visit (INDEPENDENT_AMBULATORY_CARE_PROVIDER_SITE_OTHER): Payer: Medicare Other | Admitting: Family Medicine

## 2018-10-02 ENCOUNTER — Encounter: Payer: Self-pay | Admitting: Family Medicine

## 2018-10-02 ENCOUNTER — Other Ambulatory Visit: Payer: Self-pay

## 2018-10-02 DIAGNOSIS — R222 Localized swelling, mass and lump, trunk: Secondary | ICD-10-CM | POA: Insufficient documentation

## 2018-10-02 DIAGNOSIS — E785 Hyperlipidemia, unspecified: Secondary | ICD-10-CM | POA: Diagnosis not present

## 2018-10-02 DIAGNOSIS — R2230 Localized swelling, mass and lump, unspecified upper limb: Secondary | ICD-10-CM | POA: Insufficient documentation

## 2018-10-02 DIAGNOSIS — Z23 Encounter for immunization: Secondary | ICD-10-CM

## 2018-10-02 NOTE — Assessment & Plan Note (Signed)
Check lipids per patient request and will ask her to follow up with PCP if needed

## 2018-10-02 NOTE — Patient Instructions (Addendum)
Nice to meet you  The area under your Right arm seems to be a small fat deposit.  I do not feel a mass and your breast exam is normal.   You should get your mammogram in the next few weeks  If the mammogram is normal then just monitor the area.  It should not be increasing in size.  If it call and we will get an ultrasound  [place] de conocerte  El rea debajo de su brazo derecho parece ser un pequeo depsito de grasa. No siento una masa y su examen de mamas es normal. Debera hacerse la mamografa en las prximas semanas.  Si la mamografa es normal, simplemente controle el rea. No debera aumentar de tamao. Si llama y nos haremos una ecografa

## 2018-10-02 NOTE — Assessment & Plan Note (Signed)
The area of her concern seems to be a vague redundancy in the skin possibly a lipoma.  No discrete mass.  Will have her get her due mammogram and monitor.  If changing or persisting would get [place]

## 2018-10-02 NOTE — Progress Notes (Signed)
Subjective  Anne Warren is a 78 y.o. female is presenting with the following  AXILLARY MASS Discovered an area below her R axilla about 2 weeks ago.  She can see it more than feel it.  No pain or itch or other areas of swelling.  No breast tenderness or masses or discharge.  Wonders what this might be  HYPERLIPIDEMIA Symptoms Chest pain on exertion:  no   Leg claudication:   no Medications (modifying factor): Compliance- is not on a statin but would like her cholesterol checked   Right upper quadrant pain- no  Muscle aches- no  Duration - years   Timing - continuous    Component Value Date/Time   CHOL 225 (H) 04/20/2017 1045   TRIG 177 (H) 04/20/2017 1045   HDL 40 04/20/2017 1045   LDLDIRECT 143 (H) 06/01/2008 2123   VLDL 43 (H) 03/19/2015 0936   CHOLHDL 5.6 (H) 04/20/2017 1045   CHOLHDL 5.1 (H) 03/19/2015 0936      Chief Complaint noted Review of Symptoms - see HPI PMH - Smoking status noted.    Objective Vital Signs reviewed BP 126/70   Pulse 69   Wt 127 lb 3.2 oz (57.7 kg)   SpO2 98%   BMI 25.69 kg/m    R Axilla Area of skin sagging in mid axilla.  I nor patient can feel any discrete mass.  Perhaps a vague fullness.  No tenderness or redness or masses Breasts: breasts appear normal, no suspicious masses, no skin or nipple changes or axillary nodes Neck:  No deformities, thyromegaly, masses, or tenderness noted.   Supple with full range of motion without pain.   Assessments/Plans  See after visit summary for details of patient instuctions  Axillary fullness The area of her concern seems to be a vague redundancy in the skin possibly a lipoma.  No discrete mass.  Will have her get her due mammogram and monitor.  If changing or persisting would get [place]   Hyperlipidemia with target LDL less than 130 Check lipids per patient request and will ask her to follow up with PCP if needed

## 2018-10-03 ENCOUNTER — Encounter: Payer: Self-pay | Admitting: Family Medicine

## 2018-10-03 LAB — LIPID PANEL
Chol/HDL Ratio: 7 ratio — ABNORMAL HIGH (ref 0.0–4.4)
Cholesterol, Total: 223 mg/dL — ABNORMAL HIGH (ref 100–199)
HDL: 32 mg/dL — ABNORMAL LOW (ref >39)
LDL Chol Calc (NIH): 135 mg/dL — ABNORMAL HIGH (ref 0–99)
Triglycerides: 309 mg/dL — ABNORMAL HIGH (ref 0–149)
VLDL Cholesterol Cal: 56 mg/dL — ABNORMAL HIGH (ref 5–40)

## 2018-10-03 NOTE — Telephone Encounter (Signed)
I'm not sure what to do with this information? Do you know where the NP was from or if we have this documented elsewhere?

## 2018-10-08 ENCOUNTER — Ambulatory Visit (INDEPENDENT_AMBULATORY_CARE_PROVIDER_SITE_OTHER): Payer: Medicare Other | Admitting: Physician Assistant

## 2018-10-08 ENCOUNTER — Other Ambulatory Visit: Payer: Self-pay

## 2018-10-08 ENCOUNTER — Encounter: Payer: Self-pay | Admitting: Physician Assistant

## 2018-10-08 VITALS — BP 126/70 | HR 76 | Temp 98.2°F | Ht 59.0 in | Wt 127.2 lb

## 2018-10-08 DIAGNOSIS — K219 Gastro-esophageal reflux disease without esophagitis: Secondary | ICD-10-CM

## 2018-10-08 DIAGNOSIS — Z1211 Encounter for screening for malignant neoplasm of colon: Secondary | ICD-10-CM

## 2018-10-08 DIAGNOSIS — K5790 Diverticulosis of intestine, part unspecified, without perforation or abscess without bleeding: Secondary | ICD-10-CM

## 2018-10-08 DIAGNOSIS — Z8719 Personal history of other diseases of the digestive system: Secondary | ICD-10-CM

## 2018-10-08 MED ORDER — OMEPRAZOLE 40 MG PO CPDR: 40.0000 mg | DELAYED_RELEASE_CAPSULE | ORAL | 11 refills | Status: DC

## 2018-10-08 MED ORDER — DICYCLOMINE HCL 10 MG PO CAPS: 10.0000 mg | ORAL_CAPSULE | Freq: Two times a day (BID) | ORAL | 4 refills | Status: DC

## 2018-10-08 MED ORDER — NA SULFATE-K SULFATE-MG SULF 17.5-3.13-1.6 GM/177ML PO SOLN: ORAL | 0 refills | Status: DC

## 2018-10-08 NOTE — Addendum Note (Signed)
Addended by: Richarda Osmond on: 10/08/2018 10:23 AM   Modules accepted: Orders

## 2018-10-08 NOTE — Progress Notes (Signed)
Subjective:    Patient ID: Anne Warren, female    DOB: 1940-11-21, 78 y.o.   MRN: [id number]  HPI Say is a pleasant 78 year old Hispanic female, new to GI in August 2020 when seen by myself, and now established with Dr. Marina Goodell who comes in for follow-up.  At that time she had 18-month history of crampy lower abdominal pain which is primarily left-sided.  She had had a prior colonoscopy which we were able to obtain a copy of.  This was done per Dr. Randa Evens in 2010 revealed left-sided diverticulosis and internal hemorrhoids, no polyps. She underwent CT of the abdomen pelvis on 09/18/2018 which showed her to be status post cholecystectomy with CBD of 1 cm, there was sigmoid diverticulitis with some stranding around the sigmoid colon and scattered rather diffuse diverticulosis. She was started on a course of Augmentin 875 mg p.o. twice daily x14 days. She is also been taking Florastor twice a day. She comes in today saying she is feeling much better than she was and is not having any ongoing abdominal pain.  She has not had any fever or chills.  Appetite has been fine and she has been eating well.  Her husband says they have been focusing on eating healthier foods.  Bowel movements have been somewhat loose while on the antibiotics. She does continue to complain of a crampy bilateral lower abdominal pain that occurs just prior to bowel movements and is relieved by defecation.  She describes this as feeling sharp at times. She is also been on omeprazole for GERD which is working well.  Review of Systems Pertinent positive and negative review of systems were noted in the above HPI section.  All other review of systems was otherwise negative. Outpatient Encounter Medications as of 10/08/2018  Medication Sig  . acetaminophen (TYLENOL) 325 MG tablet Take 650 mg by mouth as needed for headache.  . AMBULATORY NON FORMULARY MEDICATION Allergy injection Every 2 months  . Multiple Vitamins-Minerals  (MULTIVITAMIN WOMEN PO) Take by mouth.  Marland Kitchen omeprazole (PRILOSEC) 40 MG capsule Take 1 capsule (40 mg total) by mouth every morning. Take 30 minute before breakfast  . [DISCONTINUED] omeprazole (PRILOSEC) 40 MG capsule Take 1 capsule (40 mg total) by mouth every morning. Take 30 minute before breakfast  . dicyclomine (BENTYL) 10 MG capsule Take 1 capsule (10 mg total) by mouth 2 (two) times daily. Take one at 8 a.m. and one at 4 p.m.  Marland Kitchen EPINEPHrine 0.3 mg/0.3 mL IJ SOAJ injection Use as directed for severe allergic reaction (Patient not taking: Reported on 10/08/2018)  . Na Sulfate-K Sulfate-Mg Sulf 17.5-3.13-1.6 GM/177ML SOLN Suprep-Use as directed  . [DISCONTINUED] albuterol (PROVENTIL) (2.5 MG/3ML) 0.083% nebulizer solution USE 1 VIAL IN NEBULIZER EVERY 6 HOURS AS NEEDED FOR WHEEZING OR SHORTNESS OF BREATH  . [DISCONTINUED] amoxicillin-clavulanate (AUGMENTIN) 875-125 MG tablet Take 1 tablet by mouth 2 (two) times daily.  . [DISCONTINUED] gabapentin (NEURONTIN) 100 MG capsule Take 1 capsule (100 mg total) by mouth at bedtime. (Patient not taking: Reported on 10/02/2018)  . [DISCONTINUED] PROAIR HFA 108 (90 Base) MCG/ACT inhaler INHALE 2 PUFFS BY MOUTH EVERY 4 HOURS AS NEEDED (Patient not taking: Reported on 10/08/2018)   Facility-Administered Encounter Medications as of 10/08/2018  Medication  . Benralizumab SOSY 30 mg   Allergies  Allergen Reactions  . Atorvastatin Other (See Comments)    REACTION: Losing memory  . Simvastatin Other (See Comments)    REACTION: Losing memory  . Amitriptyline Hcl  REACTION: Felt bad when on this and HCTZ  . Fish Oil Nausea And Vomiting and Other (See Comments)    REACTION: and dizziness   Patient Active Problem List   Diagnosis Date Noted  . Axillary fullness 10/02/2018  . Newly recognized heart murmur 07/08/2018  . Radiculopathy 07/08/2018  . Sleepiness 09/12/2017  . Arthropathy of left shoulder 09/12/2017  . Generalized abdominal pain 10/03/2016  . Dry  mouth 02/03/2016  . Health care maintenance 10/07/2015  . Dyspnea 09/01/2015  . Polycythemia, secondary 03/22/2015  . Estrogen deficiency 03/19/2015  . Dizziness 03/19/2015  . Hematuria 11/18/2013  . Diverticulitis of colon 10/31/2013  . Episodic lightheadedness 10/22/2012  . Vitiligo 09/17/2012  . Other disorders of bone and cartilage(733.99) 04/04/2011  . Hyperlipidemia with target LDL less than 130 04/03/2011  . Cervical radiculopathy at C7 04/21/2010  . ALLERGIC RHINITIS, SEASONAL 11/09/2008  . Asthma with exacerbation 11/09/2008  . URINARY INCONTINENCE, STRESS 06/01/2008  . GERD 11/14/2007  . OSTEOARTHRITIS, CERVICAL SPINE 12/18/2006  . PSYCHOSEXUAL DYSFUNCTION UNSPECIFIED 12/04/2006  . TENSION HEADACHES, CHRONIC 11/27/2006  . DECREASED HEARING 11/27/2006  . Hypertension 11/27/2006   Social History   Socioeconomic History  . Marital status: Married    Spouse name: Lawanna Kobus  . Number of children: 1  . Years of education: 8  . Highest education level: Not on file  Occupational History  . Occupation: Retired- Retail banker: UNEMPLOYED  Social Needs  . Financial resource strain: Not on file  . Food insecurity    Worry: Not on file    Inability: Not on file  . Transportation needs    Medical: Not on file    Non-medical: Not on file  Tobacco Use  . Smoking status: Former Smoker    Packs/day: 0.10    Types: Cigarettes    Quit date: 01/30/1978    Years since quitting: 40.7  . Smokeless tobacco: Never Used  . Tobacco comment: 1 cig daily <1 year 12/11/14  Substance and Sexual Activity  . Alcohol use: Yes    Alcohol/week: 0.0 standard drinks  . Drug use: No  . Sexual activity: Yes  Lifestyle  . Physical activity    Days per week: Not on file    Minutes per session: Not on file  . Stress: Not on file  Relationships  . Social Musician on phone: Not on file    Gets together: Not on file    Attends religious service: Not on file    Active  member of club or organization: Not on file    Attends meetings of clubs or organizations: Not on file    Relationship status: Not on file  . Intimate partner violence    Fear of current or ex partner: Not on file    Emotionally abused: Not on file    Physically abused: Not on file    Forced sexual activity: Not on file  Other Topics Concern  . Not on file  Social History Narrative   Native of [place]   Married to [place], native of [place], visits children of his first marriage there.   They have a daughter, who is a Clinical research associate in [place] area   Religion, [facility] Witness      Health Care POA: Primary: sister Elwin Mocha [phone] Secondary: Milderd Meager [phone]   Emergency Contact: Husband [name] [phone]   End of Life Plan: DNR     Who lives with you: Lives in 1 story  home with husband.   Any pets: 0   Diet: Patient has a varied diet of protein, starch, and vegetables.  Patient tries not to eat red meat.    Exercise: Patient walks 2-3 times per week.  Pt has had problems with her asthma while exercising.    Seatbelts: Patient reports wearing seat belts when in vehicle.    Wynelle Link Exposure/Protection: Patient reports wearing sun protection.   Hobbies: Likes to read             Ms. Keziah family history includes Breast cancer in her sister; Cancer in her sister and sister; Depression in her sister; Heart attack in her father and mother; Heart disease in her mother; Stroke in her brother and father.      Objective:    Vitals:   10/08/18 1039  BP: 126/70  Pulse: 76  Temp: 98.2 F (36.8 C)    Physical Exam; Well-developed well-nourished older Hispanic female in no acute distress.,  Accompanied by her husband, both pleasant height, Weight, 127 BMI 25.7 HEENT; nontraumatic normocephalic, EOMI, PER R LA, sclera anicteric. Oropharynx; not examined/mask/COVID Neck; supple, no JVD Cardiovascular; regular rate and rhythm with S1-S2, no murmur rub or gallop  Pulmonary; Clear bilaterally Abdomen; soft, basically nontender tender, nondistended, no palpable mass or hepatosplenomegaly, bowel sounds are active Rectal; not done today Skin; benign exam, no jaundice rash or appreciable lesions Extremities; no clubbing cyanosis or edema skin warm and dry Neuro/Psych; alert and oriented x4, grossly nonfocal mood and affect appropriate       Assessment & Plan:   #5 78 year old Hispanic female with resolving sigmoid diverticulitis.  She will complete Augmentin tomorrow. Discussed liberal fluid intake and following a high-fiber diet. Start dicyclomine 10 mg p.o. every morning, take twice daily as needed.  Hopefully this will help lower abdominal crampy type pain. Patient is due for follow-up colonoscopy.  Colonoscopy will be scheduled with Dr. Yancey Flemings in early October.  Procedure was discussed in detail with patient including indications risks and benefits and she is agreeable to proceed.  Kresta Templeman S Onyinyechi Huante PA-C 10/08/2018   Cc: Anderson, Chelsey L, DO

## 2018-10-08 NOTE — Progress Notes (Signed)
Assessment and plan reviewed 

## 2018-10-08 NOTE — Patient Instructions (Signed)
If you are age 79 or older, your body mass index should be between 23-30. Your Body mass index is 25.7 kg/m. If this is out of the aforementioned range listed, please consider follow up with your Primary Care Provider.  If you are age 56 or younger, your body mass index should be between 19-25. Your Body mass index is 25.7 kg/m. If this is out of the aformentioned range listed, please consider follow up with your Primary Care Provider.   You have been scheduled for a colonoscopy. Please follow written instructions given to you at your visit today.  Please pick up your prep supplies at the pharmacy within the next 1-3 days. If you use inhalers (even only as needed), please bring them with you on the day of your procedure. Your physician has requested that you go to www.startemmi.com and enter the access code given to you at your visit today. This web site gives a general overview about your procedure. However, you should still follow specific instructions given to you by our office regarding your preparation for the procedure.  We have sent the following medications to your pharmacy for you to pick up at your convenience: Suprep Omeprazole Bentyl  You have been given a High Fiber/ Low Gas Diet. You have been given a handout for Diverticulosis & Diverticulitis.  Thank you for choosing me and [facility].   Amy Esterwood, PA-C

## 2018-10-14 NOTE — Telephone Encounter (Signed)
NP is from pt insurance company (in the call info section).  They call to report findings and then the provider chooses to further workup or not. Christen Bame, CMA

## 2018-10-29 ENCOUNTER — Ambulatory Visit: Payer: Medicare Other

## 2018-10-29 ENCOUNTER — Ambulatory Visit (INDEPENDENT_AMBULATORY_CARE_PROVIDER_SITE_OTHER): Payer: Medicare Other

## 2018-10-29 ENCOUNTER — Other Ambulatory Visit: Payer: Self-pay

## 2018-10-29 DIAGNOSIS — J4550 Severe persistent asthma, uncomplicated: Secondary | ICD-10-CM | POA: Diagnosis not present

## 2018-11-15 ENCOUNTER — Other Ambulatory Visit: Payer: Self-pay

## 2018-11-15 ENCOUNTER — Encounter: Payer: Self-pay | Admitting: Internal Medicine

## 2018-11-15 DIAGNOSIS — R29898 Other symptoms and signs involving the musculoskeletal system: Secondary | ICD-10-CM

## 2018-11-19 ENCOUNTER — Telehealth: Payer: Self-pay

## 2018-11-19 ENCOUNTER — Encounter: Payer: Self-pay | Admitting: Vascular Surgery

## 2018-11-19 ENCOUNTER — Ambulatory Visit (HOSPITAL_COMMUNITY)
Admission: RE | Admit: 2018-11-19 | Discharge: 2018-11-19 | Disposition: A | Discharge status: Home / Self Care | Payer: Medicare Other | Source: Ambulatory Visit | Attending: Family | Admitting: Family

## 2018-11-19 ENCOUNTER — Ambulatory Visit (INDEPENDENT_AMBULATORY_CARE_PROVIDER_SITE_OTHER): Payer: Medicare Other | Admitting: Vascular Surgery

## 2018-11-19 ENCOUNTER — Other Ambulatory Visit: Payer: Self-pay

## 2018-11-19 DIAGNOSIS — I70219 Atherosclerosis of native arteries of extremities with intermittent claudication, unspecified extremity: Secondary | ICD-10-CM

## 2018-11-19 DIAGNOSIS — I70213 Atherosclerosis of native arteries of extremities with intermittent claudication, bilateral legs: Secondary | ICD-10-CM

## 2018-11-19 DIAGNOSIS — R29898 Other symptoms and signs involving the musculoskeletal system: Secondary | ICD-10-CM | POA: Diagnosis not present

## 2018-11-19 HISTORY — DX: Atherosclerosis of native arteries of extremities with intermittent claudication, unspecified extremity: I70.219

## 2018-11-19 MED ORDER — CILOSTAZOL 100 MG PO TABS: 100.0000 mg | ORAL_TABLET | Freq: Two times a day (BID) | ORAL | 5 refills | Status: DC

## 2018-11-19 NOTE — Telephone Encounter (Signed)
Covid-19 screening questions   Do you now or have you had a fever in the last 14 days? NO   Do you have any respiratory symptoms of shortness of breath or cough now or in the last 14 days? NO  Do you have any family members or close contacts with diagnosed or suspected Covid-19 in the past 14 days? NO  Have you been tested for Covid-19 and found to be positive? NO        
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
## 2018-11-20 ENCOUNTER — Encounter: Payer: Self-pay | Admitting: Internal Medicine

## 2018-11-20 ENCOUNTER — Ambulatory Visit (AMBULATORY_SURGERY_CENTER): Payer: Medicare Other | Admitting: Internal Medicine

## 2018-11-20 VITALS — BP 178/80 | HR 60 | Temp 98.5°F | Resp 15 | Ht 59.0 in | Wt 127.0 lb

## 2018-11-20 DIAGNOSIS — D123 Benign neoplasm of transverse colon: Secondary | ICD-10-CM

## 2018-11-20 DIAGNOSIS — D125 Benign neoplasm of sigmoid colon: Secondary | ICD-10-CM

## 2018-11-20 DIAGNOSIS — K5140 Inflammatory polyps of colon without complications: Secondary | ICD-10-CM | POA: Diagnosis not present

## 2018-11-20 DIAGNOSIS — Z1211 Encounter for screening for malignant neoplasm of colon: Secondary | ICD-10-CM | POA: Diagnosis not present

## 2018-11-20 DIAGNOSIS — D122 Benign neoplasm of ascending colon: Secondary | ICD-10-CM

## 2018-11-20 MED ORDER — SODIUM CHLORIDE 0.9 % IV SOLN: 500.0000 mL | Freq: Once | INTRAVENOUS | Status: DC

## 2018-11-20 NOTE — Progress Notes (Signed)
Pt states her "stomach is hurting upon arrival to RR."  Her abdomen is distended.  She is able to pass air and states "it feels better now."  Encouraged to continue to pass air.   Interpreter used today at the [facility] for this pt.  Interpreter's name is- Actor

## 2018-11-20 NOTE — Op Note (Signed)
[facility] Patient Name: Anne Warren Procedure Date: 11/20/2018 10:29 AM MRN: [id number] Endoscopist: Docia Chuck. Henrene Pastor , MD Age: 78 Referring MD:  Date of Birth: [birth year] Gender: Female Account #: [id number] Procedure:                Colonoscopy with cold snare polypectomy x 4 Indications:              Screening for colorectal malignant neoplasm.                            Negative index exam with Dr. Oletta Lamas 2010. Recently                            seen in the office by GI PA after a bout of                            successfully treated diverticulitis Medicines:                Monitored Anesthesia Care Procedure:                Pre-Anesthesia Assessment:                           - Prior to the procedure, a History and Physical                            was performed, and patient medications and                            allergies were reviewed. The patient's tolerance of                            previous anesthesia was also reviewed. The risks                            and benefits of the procedure and the sedation                            options and risks were discussed with the patient.                            All questions were answered, and informed consent                            was obtained. Prior Anticoagulants: The patient has                            taken no previous anticoagulant or antiplatelet                            agents. ASA Grade Assessment: II - A patient with                            mild systemic disease. After reviewing the risks  and benefits, the patient was deemed in                            satisfactory condition to undergo the procedure.                           After obtaining informed consent, the colonoscope                            was passed under direct vision. Throughout the                            procedure, the patient's blood pressure, pulse, and   oxygen saturations were monitored continuously. The                            Colonoscope was introduced through the anus and                            advanced to the the cecum, identified by                            appendiceal orifice and ileocecal valve. The                            ileocecal valve, appendiceal orifice, and rectum                            were photographed. The quality of the bowel                            preparation was excellent. The colonoscopy was                            performed without difficulty. The patient tolerated                            the procedure well. The bowel preparation used was                            SUPREP via split dose instruction. Scope In: 10:48:45 AM Scope Out: 11:08:52 AM Scope Withdrawal Time: 0 hours 16 minutes 4 seconds  Total Procedure Duration: 0 hours 20 minutes 7 seconds  Findings:                 Four polyps were found in the sigmoid colon,                            transverse colon and ascending colon. The polyps                            were 1 to 4 mm in size. These polyps were removed  with a cold snare. Resection and retrieval were                            complete. One of the sigmoid colon polyps was                            inflammatory appearing and located at the entrance                            of a diverticulum. After cold snare removal there                            was oozing which was successfully treated with                            snare tip cauterization.                           Multiple small and large-mouthed diverticula were                            found in the entire colon.                           The exam was otherwise without abnormality on                            direct and retroflexion views. Complications:            No immediate complications. Estimated blood loss:                            None. Estimated Blood Loss:     Estimated  blood loss: none. Impression:               - Four 1 to 4 mm polyps in the sigmoid colon, in                            the transverse colon and in the ascending colon,                            removed with a cold snare. Resected and retrieved.                           - Diverticulosis in the entire examined colon.                           - The examination was otherwise normal on direct                            and retroflexion views. Recommendation:           - Repeat colonoscopy is not recommended for  surveillance.                           - Patient has a contact number available for                            emergencies. The signs and symptoms of potential                            delayed complications were discussed with the                            patient. Return to normal activities tomorrow.                            Written discharge instructions were provided to the                            patient.                           - Resume previous diet. High-fiber diet.                           - Continue present medications.                           - Await pathology results. Docia Chuck. Henrene Pastor, MD 11/20/2018 11:16:58 AM This report has been signed electronically.

## 2018-11-20 NOTE — Progress Notes (Signed)
Report to PACU, RN, vss, BBS= Clear.  

## 2018-11-20 NOTE — Patient Instructions (Signed)
USTED TUVO UN PROCEDIMIENTO ENDOSCPICO HOY EN EL [facility]:   Lea el informe del procedimiento que se le entreg para cualquier pregunta especfica sobre lo que se Primary school teacher.  Si el informe del examen no responde a sus preguntas, por favor llame a su gastroenterlogo para aclararlo.  Si usted solicit que no se le den [employer] de lo que se Estate manager/land agent en su procedimiento al [employer] va a cuidar, entonces el informe del procedimiento se ha incluido en un sobre sellado para que usted lo revise despus cuando le sea ms conveniente.   LO QUE PUEDE ESPERAR: Algunas sensaciones de hinchazn en el abdomen.  Puede tener ms gases de lo normal.  El caminar puede ayudarle a eliminar el aire que se le puso en el tracto gastrointestinal durante el procedimiento y reducir la hinchazn.  Si le hicieron una endoscopia inferior (como una colonoscopia o una sigmoidoscopia flexible), podra notar manchas de sangre en las heces fecales o en el papel higinico.  Si se someti a una preparacin intestinal para su procedimiento, es posible que no tenga una evacuacin intestinal normal durante [employer].   Tenga en cuenta:  Es posible que note un poco de irritacin y congestin en la nariz o algn drenaje.  Esto es debido al oxgeno [employer] durante su procedimiento.  No hay que preocuparse y esto debe desaparecer ms o Scientist, research (medical).   SNTOMAS PARA REPORTAR INMEDIATAMENTE:  Despus de una endoscopia inferior (colonoscopia o sigmoidoscopia flexible):  Cantidades excesivas de sangre en las heces fecales  Sensibilidad significativa o empeoramiento de los dolores abdominales   Hinchazn aguda del abdomen que antes no tena   Fiebre de 100F o ms    Para asuntos urgentes o de Freight forwarder, puede comunicarse con un gastroenterlogo a cualquier hora llamando al [phone].  DIETA:  Recomendamos una comida pequea al principio, pero luego puede continuar con su dieta normal.  Tome  muchos lquidos, Teacher, adult education las bebidas alcohlicas durante 24 horas.    ACTIVIDAD:  Debe planear tomarse las cosas con calma por el resto del da y no debe CONDUCIR ni usar maquinaria pesada Programmer, applications (debido a los medicamentos de sedacin utilizados durante el examen).     SEGUIMIENTO: Nuestro personal llamar al nmero que aparece en su historial al siguiente da hbil de su procedimiento para ver cmo se siente y para responder cualquier pregunta o inquietud que pueda tener con respecto a la informacin que se le dio despus del procedimiento. Si no podemos contactarle, le dejaremos un mensaje.  Sin embargo, si se siente bien y no tiene Paediatric nurse, no es necesario que nos devuelva la llamada.  Asumiremos que ha regresado a sus actividades diarias normales sin incidentes. Si se le tomaron algunas biopsias, le contactaremos por telfono o por carta en las prximas 3 semanas.  Si no ha sabido [employer] biopsias en el transcurso de 3 semanas, por favor llmenos al [phone].   FIRMAS/CONFIDENCIALIDAD: Usted y/o el acompaante que le cuide han firmado documentos que se ingresarn en su historial mdico electrnico.  Estas firmas atestiguan el hecho de que la informacin anterior   Await pathology  Follow high fiber diet Continue your normal medications

## 2018-11-20 NOTE — Progress Notes (Signed)
Called to room to assist during endoscopic procedure.  Patient ID and intended procedure confirmed with present staff. Received instructions for my participation in the procedure from the performing physician.  

## 2018-11-21 ENCOUNTER — Other Ambulatory Visit: Payer: Self-pay

## 2018-11-21 DIAGNOSIS — I70213 Atherosclerosis of native arteries of extremities with intermittent claudication, bilateral legs: Secondary | ICD-10-CM

## 2018-11-22 ENCOUNTER — Telehealth: Payer: Self-pay

## 2018-11-22 ENCOUNTER — Encounter: Payer: Self-pay | Admitting: Internal Medicine

## 2018-11-22 ENCOUNTER — Telehealth: Payer: Self-pay | Admitting: *Deleted

## 2018-11-22 NOTE — Telephone Encounter (Signed)
  Follow up Call-  Call back number 11/20/2018  Post procedure Call Back phone  # [phone]  Permission to leave phone message Yes  Some recent data might be hidden    No voicemail set up, unable to leave a message

## 2018-11-22 NOTE — Telephone Encounter (Signed)
Follow up call attempted.  NA-no voicemail

## 2018-11-26 ENCOUNTER — Telehealth: Payer: Self-pay | Admitting: Internal Medicine

## 2018-11-26 ENCOUNTER — Other Ambulatory Visit: Payer: Self-pay

## 2018-11-26 MED ORDER — DICYCLOMINE HCL 10 MG PO CAPS: 10.0000 mg | ORAL_CAPSULE | Freq: Three times a day (TID) | ORAL | 1 refills | Status: DC

## 2018-11-26 NOTE — Telephone Encounter (Signed)
Pt states the day after colonoscopy she felt fine but after she started eating solid foods again she started having cramping discomfort in her lower abdomen. She states she feels bloated also. Reports her last BM was this morning, it was a little loose for her but not diarrhea. Pt wants to know if there is something she can take for the cramping discomfort. Please advise.

## 2018-11-26 NOTE — Telephone Encounter (Signed)
Prescription sent to pharmacy and pt notified via Interpreter. She knows to take the bentyl before meals and at bedtime as needed.

## 2018-11-26 NOTE — Telephone Encounter (Signed)
Prescribe Bentyl.  Thanks

## 2018-12-24 ENCOUNTER — Ambulatory Visit: Payer: Self-pay

## 2018-12-24 ENCOUNTER — Ambulatory Visit (INDEPENDENT_AMBULATORY_CARE_PROVIDER_SITE_OTHER): Payer: Medicare Other | Admitting: Student in an Organized Health Care Education/Training Program

## 2018-12-24 ENCOUNTER — Other Ambulatory Visit: Payer: Self-pay

## 2018-12-24 ENCOUNTER — Encounter: Payer: Self-pay | Admitting: Allergy and Immunology

## 2018-12-24 ENCOUNTER — Ambulatory Visit: Payer: Medicare Other | Admitting: Allergy and Immunology

## 2018-12-24 VITALS — BP 142/88 | HR 68 | Temp 98.1°F | Resp 20 | Ht 59.5 in | Wt 134.4 lb

## 2018-12-24 DIAGNOSIS — K219 Gastro-esophageal reflux disease without esophagitis: Secondary | ICD-10-CM | POA: Diagnosis not present

## 2018-12-24 DIAGNOSIS — J4550 Severe persistent asthma, uncomplicated: Secondary | ICD-10-CM | POA: Diagnosis not present

## 2018-12-24 DIAGNOSIS — I10 Essential (primary) hypertension: Secondary | ICD-10-CM | POA: Diagnosis not present

## 2018-12-24 DIAGNOSIS — D126 Benign neoplasm of colon, unspecified: Secondary | ICD-10-CM | POA: Diagnosis not present

## 2018-12-24 DIAGNOSIS — R252 Cramp and spasm: Secondary | ICD-10-CM

## 2018-12-24 DIAGNOSIS — J3089 Other allergic rhinitis: Secondary | ICD-10-CM | POA: Diagnosis not present

## 2018-12-24 DIAGNOSIS — D7219 Other eosinophilia: Secondary | ICD-10-CM

## 2018-12-24 HISTORY — DX: Cramp and spasm: R25.2

## 2018-12-24 HISTORY — DX: Benign neoplasm of colon, unspecified: D12.6

## 2018-12-24 MED ORDER — METAMUCIL SMOOTH TEXTURE 58.6 % PO POWD: 1.0000 | Freq: Three times a day (TID) | ORAL | 2 refills | Status: DC

## 2018-12-24 NOTE — Assessment & Plan Note (Signed)
Continue to take medication prescribed by vascular Recommended compression stockings and feet elevation Return to vascular for repeat ABIs in about 5 months

## 2018-12-24 NOTE — Patient Instructions (Signed)
  1. Continue Benralizumab injections (EpiPen)  2. Treat and prevent reflux by limiting caffeine consumption as much as possible  3. If needed:   A. albuterol nebulization or ProAir HFA 2 puffs every 4-6 hours  B. OTC cetirizine 10 mg tablet 1 time per day  4. Return to clinic in 6 months or earlier if problem  5.  Obtain Covid vaccine when available

## 2018-12-24 NOTE — Patient Instructions (Signed)
It was a pleasure to see you today!  To summarize our discussion for this visit:  Please follow up with your GI doctor to discuss the need for further colonoscopies.   You can continue to take the bentyl and I would recommend taking a fiber supplement as well. This will help with your abdominal bloating  Some additional health maintenance measures we should update are: Health Maintenance Due  Topic Date Due  . MAMMOGRAM  04/25/2018  .    Please return to our clinic to see me in about 2 months.  Call the clinic at [phone] if your symptoms worsen or you have any concerns.   Thank you for allowing me to take part in your care,  Dr. Doristine Mango
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
## 2018-12-24 NOTE — Assessment & Plan Note (Signed)
Blood pressure elevated at visit and repeat 20 minutes later.  Patient is asymptomatic.  Will continue to monitor.

## 2018-12-24 NOTE — Assessment & Plan Note (Signed)
Completed colonoscopy 10/21. We discussed results. Recommended continue to take prescribed bentyl and increase fiber intake. F/u with GI

## 2018-12-25 ENCOUNTER — Encounter: Payer: Self-pay | Admitting: Allergy and Immunology

## 2019-01-01 ENCOUNTER — Other Ambulatory Visit: Payer: Self-pay

## 2019-01-01 ENCOUNTER — Ambulatory Visit (INDEPENDENT_AMBULATORY_CARE_PROVIDER_SITE_OTHER): Payer: Medicare Other

## 2019-01-01 DIAGNOSIS — J4550 Severe persistent asthma, uncomplicated: Secondary | ICD-10-CM

## 2019-01-16 ENCOUNTER — Ambulatory Visit: Payer: Medicare Other | Admitting: Internal Medicine

## 2019-02-26 ENCOUNTER — Other Ambulatory Visit: Payer: Self-pay

## 2019-02-26 ENCOUNTER — Ambulatory Visit (INDEPENDENT_AMBULATORY_CARE_PROVIDER_SITE_OTHER): Payer: Medicare Other | Admitting: *Deleted

## 2019-02-26 DIAGNOSIS — J4550 Severe persistent asthma, uncomplicated: Secondary | ICD-10-CM | POA: Diagnosis not present

## 2019-02-27 ENCOUNTER — Encounter: Payer: Self-pay | Admitting: Internal Medicine

## 2019-02-27 ENCOUNTER — Ambulatory Visit: Payer: Medicare Other | Admitting: Internal Medicine

## 2019-02-27 VITALS — BP 118/70 | HR 78 | Temp 98.0°F | Ht 59.0 in | Wt 117.0 lb

## 2019-02-27 DIAGNOSIS — K5790 Diverticulosis of intestine, part unspecified, without perforation or abscess without bleeding: Secondary | ICD-10-CM

## 2019-02-27 DIAGNOSIS — K219 Gastro-esophageal reflux disease without esophagitis: Secondary | ICD-10-CM | POA: Diagnosis not present

## 2019-02-27 DIAGNOSIS — R1032 Left lower quadrant pain: Secondary | ICD-10-CM | POA: Diagnosis not present

## 2019-02-27 DIAGNOSIS — K5732 Diverticulitis of large intestine without perforation or abscess without bleeding: Secondary | ICD-10-CM

## 2019-02-27 MED ORDER — OMEPRAZOLE 40 MG PO CPDR: 40.0000 mg | DELAYED_RELEASE_CAPSULE | ORAL | 3 refills | Status: DC

## 2019-02-27 MED ORDER — DICYCLOMINE HCL 10 MG PO CAPS: 10.0000 mg | ORAL_CAPSULE | Freq: Two times a day (BID) | ORAL | 3 refills | Status: DC

## 2019-02-27 NOTE — Patient Instructions (Signed)
We have sent the following medications to your pharmacy for you to pick up at your convenience:  Omeprazole, Dicyclomine.  Please follow up in one year
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
## 2019-02-28 ENCOUNTER — Ambulatory Visit: Payer: Medicare Other

## 2019-03-05 ENCOUNTER — Ambulatory Visit: Payer: Medicare Other

## 2019-03-08 ENCOUNTER — Ambulatory Visit: Payer: Medicare Other | Attending: Internal Medicine

## 2019-03-08 DIAGNOSIS — Z23 Encounter for immunization: Secondary | ICD-10-CM | POA: Insufficient documentation

## 2019-03-08 NOTE — Progress Notes (Signed)
   Covid-19 Vaccination Clinic  Name:  Anne Warren    MRN: [id number] DOB: [birth year]  03/08/2019  Anne Warren was observed post Covid-19 immunization for 15 minutes without incidence. She was provided with Vaccine Information Sheet and instruction to access the V-Safe system.   Anne Warren was instructed to call 911 with any severe reactions post vaccine: Marland Kitchen Difficulty breathing  . Swelling of your face and throat  . A fast heartbeat  . A bad rash all over your body  . Dizziness and weakness    Immunizations Administered    Name Date Dose VIS Date Route   Pfizer COVID-19 Vaccine 03/08/2019  4:44 PM 0.3 mL 01/10/2019 Intramuscular   Manufacturer: [place]   Lot: [id number]   [place]: [id number]

## 2019-04-02 ENCOUNTER — Ambulatory Visit: Payer: Medicare Other | Attending: Internal Medicine

## 2019-04-02 DIAGNOSIS — Z23 Encounter for immunization: Secondary | ICD-10-CM | POA: Insufficient documentation

## 2019-04-02 NOTE — Progress Notes (Signed)
   Covid-19 Vaccination Clinic  Name:  CELLIA REIK    MRN: [id number] DOB: October 14, 1940  04/02/2019  Ms. Alzate was observed post Covid-19 immunization for 15 minutes without incident. She was provided with Vaccine Information Sheet and instruction to access the V-Safe system.   Ms. Backus was instructed to call 911 with any severe reactions post vaccine: Marland Kitchen Difficulty breathing  . Swelling of face and throat  . A fast heartbeat  . A bad rash all over body  . Dizziness and weakness   Immunizations Administered    Name Date Dose VIS Date Route   Pfizer COVID-19 Vaccine 04/02/2019 12:37 PM 0.3 mL 01/10/2019 Intramuscular   Manufacturer: [place]   Lot: [id number]   [place]: [id number]

## 2019-04-08 ENCOUNTER — Ambulatory Visit (INDEPENDENT_AMBULATORY_CARE_PROVIDER_SITE_OTHER): Payer: Medicare Other | Admitting: Student in an Organized Health Care Education/Training Program

## 2019-04-08 ENCOUNTER — Other Ambulatory Visit: Payer: Self-pay

## 2019-04-08 VITALS — BP 125/70 | HR 71 | Wt 131.2 lb

## 2019-04-08 DIAGNOSIS — M7989 Other specified soft tissue disorders: Secondary | ICD-10-CM

## 2019-04-08 DIAGNOSIS — Z1231 Encounter for screening mammogram for malignant neoplasm of breast: Secondary | ICD-10-CM

## 2019-04-08 DIAGNOSIS — E785 Hyperlipidemia, unspecified: Secondary | ICD-10-CM | POA: Diagnosis not present

## 2019-04-08 DIAGNOSIS — R1310 Dysphagia, unspecified: Secondary | ICD-10-CM

## 2019-04-08 DIAGNOSIS — R5383 Other fatigue: Secondary | ICD-10-CM | POA: Diagnosis not present

## 2019-04-08 DIAGNOSIS — Z131 Encounter for screening for diabetes mellitus: Secondary | ICD-10-CM

## 2019-04-08 DIAGNOSIS — I70213 Atherosclerosis of native arteries of extremities with intermittent claudication, bilateral legs: Secondary | ICD-10-CM

## 2019-04-08 LAB — POCT GLYCOSYLATED HEMOGLOBIN (HGB A1C): Hemoglobin A1C: 5.4 % (ref 4.0–5.6)

## 2019-04-08 NOTE — Progress Notes (Signed)
    SUBJECTIVE:   CHIEF COMPLAINT / HPI: medications, mammogram  Vascular-patient seen by vascular surgery for PAD and was given cilostazol which patient was not able to tolerate due to nausea after 2 attempts.  Patient is no longer taking this medication and they have follow-up with vascular in a couple of weeks with a oral request a different treatment.  GI-patient taking Bentyl, Metamucil, Prilosec with good relief of symptoms.  Mammogram-patient is due for mammogram.  Last screening a year ago was normal.  Patient has anxiety for abnormalities due to a growth in her right armpit which is nonpainful but has been growing for the past couple of years.  Dysphagia-patient states that she has had difficulty with swallowing for many years and seems like it is getting worse.  Difficulty with solids and liquids.  She often coughs like she is choking after drinking liquids.  Does not have painful throat or swelling that she notices.  Hyperlipidemia-patient not currently on statin medication.  PERTINENT  PMH / PSH: Diverticulosis, GERD,  OBJECTIVE:   BP 125/70   Pulse 71   Wt 131 lb 3.2 oz (59.5 kg)   SpO2 98%   BMI 26.50 kg/m   General: NAD, pleasant, able to participate in exam HEENT: Positive for bilateral nontender cervical lymphadenitis.  Negative thyromegaly or tenderness.  Negative TMJ crepitus Cardiac: RRR, normal heart sounds, no murmurs. 2+ radial and PT pulses bilaterally Respiratory: CTAB, normal effort, No wheezes, rales or rhonchi Abdomen: soft, nontender, nondistended, no hepatic or splenomegaly, +BS Extremities: no edema or cyanosis. WWP. R axillary-soft, mobile, nontender mass approximately 4 cm in diameter without skin changes. Skin: warm and dry, no rashes noted Neuro: alert and oriented x4, no focal deficits Psych: Normal affect and mood   ASSESSMENT/PLAN:   Atherosclerosis of native arteries of extremity with intermittent claudication ([place]) Follow up with  vascular as scheduled. Lipid panel has elevated levels diffusely. Patient has not tolerated atorvastatin or simvastatin in the past due to "memory loss". Will try rosuvastatin at low dose and attempt to increase as tolerated.  Breast cancer screening by mammogram Placed mammogram order  Dysphagia Endorsing symptoms of dysphagia. Cannot distinguish between anatomical vs mechanical via exam and history. Will obtain modified barium swallow study to confirm.     [place]

## 2019-04-08 NOTE — Patient Instructions (Signed)
It was a pleasure to see you today!  To summarize our discussion for this visit:  We are checking your blood today for signs that can be causing your symptoms.  This is done at breast center:  I am ordering a mammogram  This is done at the hospital:  I am ordering a swallow study  I am ordering a ultrasound of the growth in your armpit area  Some additional health maintenance measures we should update are: Health Maintenance Due  Topic Date Due  . MAMMOGRAM  04/25/2018  .    Please return to our clinic to see me in 3 weeks.  Call the clinic at [phone] if your symptoms worsen or you have any concerns.   Thank you for allowing me to take part in your care,  Dr. Doristine Mango

## 2019-04-09 LAB — TSH: TSH: 1.7 u[IU]/mL (ref 0.450–4.500)

## 2019-04-09 LAB — COMPREHENSIVE METABOLIC PANEL
ALT: 47 IU/L — ABNORMAL HIGH (ref 0–32)
AST: 57 IU/L — ABNORMAL HIGH (ref 0–40)
Albumin/Globulin Ratio: 1.9 (ref 1.2–2.2)
Albumin: 4.6 g/dL (ref 3.7–4.7)
Alkaline Phosphatase: 107 IU/L (ref 39–117)
BUN/Creatinine Ratio: 14 (ref 12–28)
BUN: 13 mg/dL (ref 8–27)
Bilirubin Total: 0.4 mg/dL (ref 0.0–1.2)
CO2: 24 mmol/L (ref 20–29)
Calcium: 10 mg/dL (ref 8.7–10.3)
Chloride: 102 mmol/L (ref 96–106)
Creatinine, Ser: 0.94 mg/dL (ref 0.57–1.00)
GFR calc Af Amer: 67 mL/min/{1.73_m2} (ref >59)
GFR calc non Af Amer: 58 mL/min/{1.73_m2} — ABNORMAL LOW (ref >59)
Globulin, Total: 2.4 g/dL (ref 1.5–4.5)
Glucose: 90 mg/dL (ref 65–99)
Potassium: 4.4 mmol/L (ref 3.5–5.2)
Sodium: 140 mmol/L (ref 134–144)
Total Protein: 7 g/dL (ref 6.0–8.5)

## 2019-04-09 LAB — LIPID PANEL
Chol/HDL Ratio: 5.2 ratio — ABNORMAL HIGH (ref 0.0–4.4)
Cholesterol, Total: 204 mg/dL — ABNORMAL HIGH (ref 100–199)
HDL: 39 mg/dL — ABNORMAL LOW (ref >39)
LDL Chol Calc (NIH): 127 mg/dL — ABNORMAL HIGH (ref 0–99)
Triglycerides: 211 mg/dL — ABNORMAL HIGH (ref 0–149)
VLDL Cholesterol Cal: 38 mg/dL (ref 5–40)

## 2019-04-09 LAB — CBC
Hematocrit: 43.1 % (ref 34.0–46.6)
Hemoglobin: 14.5 g/dL (ref 11.1–15.9)
MCH: 29.4 pg (ref 26.6–33.0)
MCHC: 33.6 g/dL (ref 31.5–35.7)
MCV: 87 fL (ref 79–97)
Platelets: 236 10*3/uL (ref 150–450)
RBC: 4.93 x10E6/uL (ref 3.77–5.28)
RDW: 12.7 % (ref 11.7–15.4)
WBC: 6.1 10*3/uL (ref 3.4–10.8)

## 2019-04-10 DIAGNOSIS — Z1231 Encounter for screening mammogram for malignant neoplasm of breast: Secondary | ICD-10-CM | POA: Insufficient documentation

## 2019-04-10 DIAGNOSIS — R1310 Dysphagia, unspecified: Secondary | ICD-10-CM | POA: Insufficient documentation

## 2019-04-10 DIAGNOSIS — R1312 Dysphagia, oropharyngeal phase: Secondary | ICD-10-CM | POA: Insufficient documentation

## 2019-04-10 HISTORY — DX: Dysphagia, oropharyngeal phase: R13.12

## 2019-04-10 MED ORDER — ROSUVASTATIN CALCIUM 5 MG PO TABS: 5.0000 mg | ORAL_TABLET | Freq: Every day | ORAL | 0 refills | Status: DC

## 2019-04-10 NOTE — Assessment & Plan Note (Signed)
Placed mammogram order.

## 2019-04-10 NOTE — Assessment & Plan Note (Signed)
Endorsing symptoms of dysphagia. Cannot distinguish between anatomical vs mechanical via exam and history. Will obtain modified barium swallow study to confirm.

## 2019-04-10 NOTE — Assessment & Plan Note (Signed)
Follow up with vascular as scheduled. Lipid panel has elevated levels diffusely. Patient has not tolerated atorvastatin or simvastatin in the past due to "memory loss". Will try rosuvastatin at low dose and attempt to increase as tolerated.

## 2019-04-15 ENCOUNTER — Other Ambulatory Visit: Payer: Self-pay

## 2019-04-15 ENCOUNTER — Ambulatory Visit (HOSPITAL_COMMUNITY)
Admission: RE | Admit: 2019-04-15 | Discharge: 2019-04-15 | Disposition: A | Discharge status: Home / Self Care | Payer: Medicare Other | Source: Ambulatory Visit | Attending: Family Medicine | Admitting: Family Medicine

## 2019-04-15 DIAGNOSIS — M7989 Other specified soft tissue disorders: Secondary | ICD-10-CM | POA: Insufficient documentation

## 2019-04-15 DIAGNOSIS — R2231 Localized swelling, mass and lump, right upper limb: Secondary | ICD-10-CM | POA: Diagnosis not present

## 2019-04-16 ENCOUNTER — Ambulatory Visit (HOSPITAL_COMMUNITY)
Admission: RE | Admit: 2019-04-16 | Discharge: 2019-04-16 | Disposition: A | Discharge status: Home / Self Care | Payer: Medicare Other | Source: Ambulatory Visit | Attending: Family Medicine | Admitting: Family Medicine

## 2019-04-16 DIAGNOSIS — R05 Cough: Secondary | ICD-10-CM | POA: Diagnosis not present

## 2019-04-16 DIAGNOSIS — R1310 Dysphagia, unspecified: Secondary | ICD-10-CM | POA: Diagnosis not present

## 2019-04-17 ENCOUNTER — Other Ambulatory Visit: Payer: Self-pay | Admitting: Student in an Organized Health Care Education/Training Program

## 2019-04-17 DIAGNOSIS — R1312 Dysphagia, oropharyngeal phase: Secondary | ICD-10-CM

## 2019-04-17 NOTE — Progress Notes (Signed)
Referred patient to speech outpatient per results of swallow study. Will also consider referring to neurology.  Patient's [place] reviewed and noted to have subcutaneous fat deposit but not evidence of lipoma.

## 2019-04-23 ENCOUNTER — Other Ambulatory Visit: Payer: Self-pay

## 2019-04-23 ENCOUNTER — Ambulatory Visit (INDEPENDENT_AMBULATORY_CARE_PROVIDER_SITE_OTHER): Payer: Self-pay

## 2019-04-23 DIAGNOSIS — J4550 Severe persistent asthma, uncomplicated: Secondary | ICD-10-CM

## 2019-04-28 ENCOUNTER — Other Ambulatory Visit: Payer: Self-pay

## 2019-04-28 ENCOUNTER — Encounter: Payer: Self-pay | Admitting: Student in an Organized Health Care Education/Training Program

## 2019-04-28 ENCOUNTER — Ambulatory Visit (INDEPENDENT_AMBULATORY_CARE_PROVIDER_SITE_OTHER): Payer: Medicare Other | Admitting: Student in an Organized Health Care Education/Training Program

## 2019-04-28 DIAGNOSIS — R1310 Dysphagia, unspecified: Secondary | ICD-10-CM

## 2019-04-28 DIAGNOSIS — R42 Dizziness and giddiness: Secondary | ICD-10-CM | POA: Diagnosis not present

## 2019-04-28 DIAGNOSIS — R222 Localized swelling, mass and lump, trunk: Secondary | ICD-10-CM

## 2019-04-28 DIAGNOSIS — I70213 Atherosclerosis of native arteries of extremities with intermittent claudication, bilateral legs: Secondary | ICD-10-CM

## 2019-04-28 DIAGNOSIS — R2230 Localized swelling, mass and lump, unspecified upper limb: Secondary | ICD-10-CM

## 2019-04-28 MED ORDER — GABAPENTIN 250 MG/5ML PO SOLN: 200.0000 mg | Freq: Every day | ORAL | 12 refills | Status: DC

## 2019-04-28 NOTE — Progress Notes (Signed)
    SUBJECTIVE:   CHIEF COMPLAINT / HPI: several follow up and new problems to discuss today.   Continued leg pain due to peripheral arterial disease.  Patient is following up with vascular surgery but was unable to tolerate the last medication due to nausea. Unknown what this medication was but they do not want to follow up with VAS as they have become tired of going to specialists.   Continues to have dysphagia without odynophagia.  Have not heard back from speech referral yet.   At the end of the visit patient endorses having dizziness with bending over and has had extensive cranial imaging within the last couple of years which was nonrevealing. Symptoms are unchanged from previous workups. Not currently symptomatic. It is very difficult for me to address this patient's concerns properly for multiple reasons- patient and husband will bring up many, many complaints each visit which are mostly vague in description and exam benign as well as declining interpreters which I think would be helpful.   Discussed ultrasound results that there is just fatty tissue under her arm without signs of tumor or abscess.  She again endorses no pain with this tissue so we will just continue to watch.  Patient is scheduled for her mammogram in approximately a week.  PERTINENT  PMH / PSH: primary language spanish and declines interpretor.   OBJECTIVE:   BP 126/60   Pulse 80   Ht 4\' 11"  (1.499 m)   Wt 133 lb 4 oz (60.4 kg)   SpO2 98%   BMI 26.91 kg/m   General: NAD, pleasant, able to participate in exam Cardiac: RRR, normal heart sounds, no murmurs. 2+ radial and PT pulses bilaterally Respiratory: CTAB, normal effort, No wheezes, rales or rhonchi Abdomen: soft, nontender, nondistended, no hepatic or splenomegaly, +BS Extremities: no edema or cyanosis. WWP. Skin: warm and dry, no rashes noted Neuro: alert and oriented x4, no focal deficits Psych: Normal affect and mood   ASSESSMENT/PLAN:    Atherosclerosis of native arteries of extremity with intermittent claudication ([facility]) Recommended follow up with VAS Prescribed gabapentin liquid QHS as adjunct for pain/sleep but clearly won't treat the problem Patient was able to tolerate the new statin well. Will continue crestor 5 and consider increasing dose at next visit.  Dysphagia Speech referral has been placed after barium swallow and patient has not heard back yet. No contact info in chart for referral so asked patient to let me know if they do not hear from them in the next couple weeks and I can check in again.  My concern is for increased risk of aspiration.  Weight is stable  Dizziness Extensive workup up previously including head imaging without cause. Could consider trial of empiric tx with meclizine but I'd like her to be seen by geri clinic for further evaluation of multiple complaints and possible contributions. Messaged for appointment set up and discussed with patient  Axillary fullness US showing fatty tissue. Not lipoma or abscess. Patient continues to deny pain or interference due to the asymmetrical growth and declined surgery referral at this time.      [place]

## 2019-04-28 NOTE — Patient Instructions (Signed)
It was a pleasure to see you today!   To summarize our discussion for this visit:  Follow-up with your scheduled mammogram  The speech clinic should call you for an appointment.  If you do not hear from them in the next week, please let me know so that I can reach out to them.  I am prescribing gabapentin for your leg pain which you can take at night as it will probably make you sleepy.  I will call you with a new appointment time where we can discuss your dizziness.  Some additional health maintenance measures we should update are: Health Maintenance Due  Topic Date Due  . MAMMOGRAM  04/25/2018  .   Please return to our clinic to see me soon. Will call for the appointment time.  Call the clinic at [phone] if your symptoms worsen or you have any concerns.   Thank you for allowing me to take part in your care,  Dr. Doristine Mango

## 2019-04-30 NOTE — Assessment & Plan Note (Signed)
Extensive workup up previously including head imaging without cause. Could consider trial of empiric tx with meclizine but I'd like her to be seen by geri clinic for further evaluation of multiple complaints and possible contributions. Messaged for appointment set up and discussed with patient

## 2019-04-30 NOTE — Assessment & Plan Note (Addendum)
Recommended follow up with VAS Prescribed gabapentin liquid QHS as adjunct for pain/sleep but clearly won't treat the problem Patient was able to tolerate the new statin well. Will continue crestor 5 and consider increasing dose at next visit.

## 2019-04-30 NOTE — Assessment & Plan Note (Signed)
Speech referral has been placed after barium swallow and patient has not heard back yet. No contact info in chart for referral so asked patient to let me know if they do not hear from them in the next couple weeks and I can check in again.  My concern is for increased risk of aspiration.  Weight is stable

## 2019-04-30 NOTE — Assessment & Plan Note (Signed)
US showing fatty tissue. Not lipoma or abscess. Patient continues to deny pain or interference due to the asymmetrical growth and declined surgery referral at this time.

## 2019-05-07 ENCOUNTER — Other Ambulatory Visit: Payer: Self-pay

## 2019-05-07 ENCOUNTER — Ambulatory Visit
Admission: RE | Admit: 2019-05-07 | Discharge: 2019-05-07 | Disposition: A | Discharge status: Home / Self Care | Payer: Medicare Other | Source: Ambulatory Visit | Attending: Family Medicine | Admitting: Family Medicine

## 2019-05-07 DIAGNOSIS — Z1231 Encounter for screening mammogram for malignant neoplasm of breast: Secondary | ICD-10-CM | POA: Diagnosis not present

## 2019-05-12 ENCOUNTER — Ambulatory Visit: Payer: Medicare Other | Attending: Family Medicine | Admitting: Speech Pathology

## 2019-05-12 ENCOUNTER — Other Ambulatory Visit: Payer: Self-pay

## 2019-05-12 DIAGNOSIS — R482 Apraxia: Secondary | ICD-10-CM

## 2019-05-12 DIAGNOSIS — R1310 Dysphagia, unspecified: Secondary | ICD-10-CM

## 2019-05-12 NOTE — Patient Instructions (Signed)
   Use a chin tuck with all of your liquids, including coffee  Remember soup, cereal, juicy fruits all have liquids - tuck your chin  When you swallow, swallow hard  No distractions or conversation with meals  No straws
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
## 2019-05-27 ENCOUNTER — Telehealth: Payer: Self-pay | Admitting: *Deleted

## 2019-05-27 DIAGNOSIS — Z0189 Encounter for other specified special examinations: Secondary | ICD-10-CM

## 2019-05-27 NOTE — Telephone Encounter (Signed)
Spoke with patient and her husband regarding the geri appointment.  I reminded them that Dr. Ouida Sills wanted her to be seen for her dizziness and dysphagia.  Patient is active with speech therapy for the dysphagia and was agreeable to come in and discuss her dizziness.  Will mail out paperwork along with appointment card to them. CCM referral placed.  Mayre Bury,CMA

## 2019-06-02 ENCOUNTER — Encounter: Payer: Self-pay | Admitting: Speech Pathology

## 2019-06-02 ENCOUNTER — Ambulatory Visit: Payer: Medicare Other | Attending: Family Medicine | Admitting: Speech Pathology

## 2019-06-02 ENCOUNTER — Other Ambulatory Visit: Payer: Self-pay

## 2019-06-02 DIAGNOSIS — R482 Apraxia: Secondary | ICD-10-CM | POA: Diagnosis not present

## 2019-06-02 DIAGNOSIS — R1310 Dysphagia, unspecified: Secondary | ICD-10-CM | POA: Insufficient documentation
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
## 2019-06-02 NOTE — Patient Instructions (Signed)
  15 Hard swallows - squeeze your neck muscles - twice a day  Use a hard swallow for solids  When you are having difficulty speaking, slow down your rate - talk syllable by syllable  Let others know you may talk a little slower and not to interrupt you

## 2019-06-04 ENCOUNTER — Ambulatory Visit: Payer: Medicare Other | Admitting: Speech Pathology

## 2019-06-04 ENCOUNTER — Other Ambulatory Visit: Payer: Self-pay

## 2019-06-04 ENCOUNTER — Encounter: Payer: Self-pay | Admitting: Speech Pathology

## 2019-06-04 DIAGNOSIS — R1310 Dysphagia, unspecified: Secondary | ICD-10-CM

## 2019-06-04 DIAGNOSIS — R482 Apraxia: Secondary | ICD-10-CM

## 2019-06-04 NOTE — Patient Instructions (Signed)
   Hard swallow with solids  Then liquid sip with chin down  Alternate solid then liquid  Reduce distractions with meals  It will take you longer to eat  Good job chewing really good  Generate a list of words you are had trouble saying   Signs of Aspiration Pneumonia   . Chest pain/tightness . Fever (can be low grade) . Cough  o With foul-smelling phlegm (sputum) o With sputum containing pus or blood o With greenish sputum . Fatigue  . Shortness of breath  . Wheezing   **IF YOU HAVE THESE SIGNS, CONTACT YOUR DOCTOR OR GO TO THE EMERGENCY DEPARTMENT OR URGENT CARE AS SOON AS POSSIBLE**
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
## 2019-06-09 ENCOUNTER — Ambulatory Visit: Payer: Medicare Other | Admitting: Speech Pathology

## 2019-06-11 ENCOUNTER — Ambulatory Visit: Payer: Medicare Other | Admitting: Speech Pathology

## 2019-06-12 ENCOUNTER — Ambulatory Visit: Payer: Self-pay | Admitting: Licensed Clinical Social Worker

## 2019-06-12 NOTE — Chronic Care Management (AMB) (Signed)
    Clinical Social Work  Care Management Outreach   06/12/2019 Name: Anne Warren MRN: [id number] DOB: 09/17/1940  Anne Warren is a 79 y.o. year old female who is a primary care patient of Richarda Osmond, DO .  The Care Management team was consulted to complete psychosocial assessment for Geric Clinin. LCSW reached out to Stark Jock twice today by phone to introduce self, assess needs.  The outreach was unsuccessful.   Unable to leave a HIPPA compliant phone message due to voice mail not being set up.   Review of patient status, including review of consultants reports, relevant laboratory and other test results, and collaboration with appropriate care team members and the patient's provider was performed as part of comprehensive patient evaluation and provision of care management services.    Follow Up Plan: LCSW will call again in 2 to 3 days or interview patient during [place] clinic.  Casimer Lanius, [place] / [place]   [phone] 1:57 PM

## 2019-06-16 ENCOUNTER — Ambulatory Visit: Payer: Medicare Other | Admitting: Speech Pathology

## 2019-06-17 ENCOUNTER — Ambulatory Visit: Payer: Self-pay | Admitting: Licensed Clinical Social Worker

## 2019-06-17 NOTE — Chronic Care Management (AMB) (Signed)
    Clinical Social Work  Care Management Outreach   06/17/2019 Name: Anne Warren MRN: [id number] DOB: [birth year]  Anne Warren is a 79 y.o. year old female who is a primary care patient of Anne Osmond, DO .   The Care Management team was consulted for assessing psychosocial needs for [facility] clinic. LCSW reached out to Stark Jock today by phone to introduce self, assess needs and offer Care Management services and interventions.    Review of patient status, including review of consultants reports, relevant laboratory and other test results, and collaboration with appropriate care team members and the patient's provider was performed as part of comprehensive patient evaluation and provision of care management services.     Follow Up Plan: Phone appointment scheduled 06/17/19  Casimer Lanius, [place] / [place]   [phone] 11:49 AM

## 2019-06-18 ENCOUNTER — Ambulatory Visit: Payer: Medicare Other | Admitting: Licensed Clinical Social Worker

## 2019-06-18 ENCOUNTER — Encounter: Payer: Self-pay | Admitting: Licensed Clinical Social Worker

## 2019-06-18 ENCOUNTER — Ambulatory Visit (INDEPENDENT_AMBULATORY_CARE_PROVIDER_SITE_OTHER): Payer: Medicare Other

## 2019-06-18 ENCOUNTER — Other Ambulatory Visit: Payer: Self-pay

## 2019-06-18 DIAGNOSIS — J4550 Severe persistent asthma, uncomplicated: Secondary | ICD-10-CM

## 2019-06-18 DIAGNOSIS — Z139 Encounter for screening, unspecified: Secondary | ICD-10-CM

## 2019-06-18 NOTE — Chronic Care Management (AMB) (Signed)
  Care Management Clinical Social Work  [facility] Psychosocial  06/18/2019 Name: Anne Warren MRN: [id number] DOB: 03-19-1940 Anne Warren is a 79 y.o. year old female who sees Richarda Osmond, DO for primary care.  Referred by Dr. McDiarmid for assessing psychosocial needs, support and barriers. (Look under patient's history for social information). Patient agreed to services provided today and verbal consent obtained.   Phone interview with patient and husband on speaker phone. Most information was provided by husband.  Patient speaks some english and prefer not having an interpreter.  Reports no falls and able to complete all ADL's.  Patient continues to drive.  She and husband jointly do cooking and they care for each other. They do not receive assistance from any social programs and have no difficulty with meeting basic needs.  SDOH (Social Determinants of Health) assessments performed: Yes: no needs identified. Advance Directive Status: See [employer] application for copy of completed document.  Review of patient status, including review of consultants reports, relevant laboratory and other test results, and collaboration with appropriate care team members and the patient's provider was performed as part of comprehensive patient evaluation and provision of chronic care management services.   Outpatient Encounter Medications as of 06/18/2019  Medication Sig  . acetaminophen (TYLENOL) 325 MG tablet Take 650 mg by mouth as needed for headache.  . AMBULATORY NON FORMULARY MEDICATION Allergy injection Every 2 months  . dicyclomine (BENTYL) 10 MG capsule Take 1 capsule (10 mg total) by mouth 2 (two) times daily. Take one at 8 a.m. and one at 4 p.m.  . gabapentin (NEURONTIN) 250 MG/5ML solution Take 4 mLs (200 mg total) by mouth at bedtime.  . Multiple Vitamins-Minerals (MULTIVITAMIN WOMEN PO) Take by mouth.  Marland Kitchen omeprazole (PRILOSEC) 40 MG capsule Take 1 capsule (40 mg total) by mouth every morning.  Take 30 minute before breakfast  . psyllium (METAMUCIL SMOOTH TEXTURE) 58.6 % powder Take 1 packet by mouth 3 (three) times daily. (Patient taking differently: Take 1 packet by mouth 3 (three) times daily as needed. )  . rosuvastatin (CRESTOR) 5 MG tablet Take 1 tablet (5 mg total) by mouth daily.   Facility-Administered Encounter Medications as of 06/18/2019  Medication  . Benralizumab SOSY 30 mg   Recommendation: No recommendation for patient at this time. Plan:  LCSW available for ongoing assessment of needs if needed during [place], [place] / [place]   [phone] 12:01 PM

## 2019-06-19 ENCOUNTER — Ambulatory Visit: Payer: Medicare Other

## 2019-06-19 ENCOUNTER — Other Ambulatory Visit: Payer: Self-pay

## 2019-06-19 ENCOUNTER — Ambulatory Visit: Payer: Self-pay | Admitting: Licensed Clinical Social Worker

## 2019-06-19 ENCOUNTER — Encounter: Payer: Medicare Other | Admitting: Speech Pathology

## 2019-06-19 ENCOUNTER — Ambulatory Visit (INDEPENDENT_AMBULATORY_CARE_PROVIDER_SITE_OTHER): Payer: Medicare Other | Admitting: Family Medicine

## 2019-06-19 VITALS — BP 122/70 | HR 71 | Ht 59.0 in | Wt 133.0 lb

## 2019-06-19 DIAGNOSIS — Z9189 Other specified personal risk factors, not elsewhere classified: Secondary | ICD-10-CM

## 2019-06-19 DIAGNOSIS — R1312 Dysphagia, oropharyngeal phase: Secondary | ICD-10-CM

## 2019-06-19 DIAGNOSIS — G8929 Other chronic pain: Secondary | ICD-10-CM

## 2019-06-19 DIAGNOSIS — M2142 Flat foot [pes planus] (acquired), left foot: Secondary | ICD-10-CM

## 2019-06-19 DIAGNOSIS — M2141 Flat foot [pes planus] (acquired), right foot: Secondary | ICD-10-CM

## 2019-06-19 DIAGNOSIS — R482 Apraxia: Secondary | ICD-10-CM

## 2019-06-19 DIAGNOSIS — G3184 Mild cognitive impairment, so stated: Secondary | ICD-10-CM

## 2019-06-19 DIAGNOSIS — R269 Unspecified abnormalities of gait and mobility: Secondary | ICD-10-CM | POA: Diagnosis not present

## 2019-06-19 DIAGNOSIS — J4520 Mild intermittent asthma, uncomplicated: Secondary | ICD-10-CM

## 2019-06-19 DIAGNOSIS — R2689 Other abnormalities of gait and mobility: Secondary | ICD-10-CM

## 2019-06-19 DIAGNOSIS — K219 Gastro-esophageal reflux disease without esophagitis: Secondary | ICD-10-CM

## 2019-06-19 DIAGNOSIS — E7800 Pure hypercholesterolemia, unspecified: Secondary | ICD-10-CM

## 2019-06-19 DIAGNOSIS — Z0000 Encounter for general adult medical examination without abnormal findings: Secondary | ICD-10-CM

## 2019-06-19 DIAGNOSIS — R42 Dizziness and giddiness: Secondary | ICD-10-CM

## 2019-06-19 DIAGNOSIS — M79671 Pain in right foot: Secondary | ICD-10-CM

## 2019-06-19 DIAGNOSIS — M79672 Pain in left foot: Secondary | ICD-10-CM

## 2019-06-19 MED ORDER — SHINGRIX 50 MCG/0.5ML IM SUSR: 0.5000 mL | Freq: Once | INTRAMUSCULAR | 1 refills | Status: AC

## 2019-06-19 NOTE — Chronic Care Management (AMB) (Signed)
  Care Management   Clinical Social Work Follow Up   06/19/2019 Name: Anne Warren MRN: [id number] DOB: 11-Oct-1940  Referred by: Richarda Osmond, DO  Reason for referral : Care Coordination (F/U)  Anne Warren is a 79 y.o. year old female who is a primary care patient of Richarda Osmond, DO.  Reason for follow-up: Collaborative office encounter with patient today during [place] clinic.  Patient was accompanied by her husband.  Provided updates copy of advance directives.  Copy given to front office to scan into chart.     Intervention: Reviewed advance directive document,  Provided advance directive pack for patient's husband, provided educational book (per provider's request) Understanding Memory Loss.  Review of patient status, including review of consultants reports, relevant laboratory and other test results, and collaboration with appropriate care team members and the patient's provider was performed as part of comprehensive patient evaluation and provision of care management services.    Advance Directive Status: N See Care Plan and [employer]yn[employer] application for related entries. SDOH (Social Determinants of Health) assessments performed: No needs identified   Outpatient Encounter Medications as of 06/19/2019  Medication Sig Note  . acetaminophen (TYLENOL) 325 MG tablet Take 650 mg by mouth as needed for headache.   . AMBULATORY NON FORMULARY MEDICATION Allergy injection Every 2 months   . Ascorbic Acid (VITAMIN C) 1000 MG tablet Take 1,000 mg by mouth daily.   Marland Kitchen dicyclomine (BENTYL) 10 MG capsule Take 1 capsule (10 mg total) by mouth 2 (two) times daily. Take one at 8 a.m. and one at 4 p.m.   . gabapentin (NEURONTIN) 250 MG/5ML solution Take 4 mLs (200 mg total) by mouth at bedtime.   . Multiple Vitamins-Minerals (MULTIVITAMIN WOMEN PO) Take by mouth.   Marland Kitchen omeprazole (PRILOSEC) 40 MG capsule Take 1 capsule (40 mg total) by mouth every morning. Take 30 minute before breakfast   .  psyllium (METAMUCIL SMOOTH TEXTURE) 58.6 % powder Take 1 packet by mouth 3 (three) times daily. (Patient taking differently: Take 1 packet by mouth daily as needed. ) 06/19/2019: Takes 1-2 times per week  . rosuvastatin (CRESTOR) 5 MG tablet Take 1 tablet (5 mg total) by mouth daily. (Patient not taking: Reported on 06/19/2019)   . Zoster Vaccine Adjuvanted [facility]) injection Inject 0.5 mLs into the muscle once for 1 dose.    Facility-Administered Encounter Medications as of 06/19/2019  Medication  . Benralizumab SOSY 30 mg   Plan: no follow up scheduled with LCSW  Casimer Lanius, [place] / [place]   [phone] 4:16 PM

## 2019-06-19 NOTE — Progress Notes (Signed)
[facility] Visit with Pharmacy  Completed a medication history with patient and patient's husband as well as reviewed medication allergies. Of note, medication history was slightly impaired due to patient not having her medications present for the medication review. Patient has PAD and reported that she is not taking prescribed rosuvastatin 5mg  daily (also with history of memory loss with atorvastatin and simvastatin) and also reported hives as well as possible history of stomach bleeding when taking aspirin 325mg  daily.  Patient also reported dizziness and sedation when taking gabapentin but reports symptoms resolve by waking in the morning but does cause her to feel unsteady when using the bathroom in the middle of the night.  Recommendations: - Consider recommending for patient to resume her rosuvastatin 5mg  daily with plan to increase as patient is able to tolerate to high intensity due to PAD - Prescription for Shringrix sent to [facility] on [address] and counseled on importance of shingles vaccination.   - Discussed with Dr. McDiarmid and dizziness may be worsened by gabapentin and dicyclomine.  - Continues on omeprazole which is not preferred in patients over 65 years. Could consider reducing dose of omeprazole and transitioning to a H2RA as needed.   Thank you for allowing pharmacy to be part of this patient's care.   Cristela Felt, PharmD PGY1 Pharmacy Resident
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
## 2019-06-23 ENCOUNTER — Encounter: Payer: Self-pay | Admitting: Neurology

## 2019-06-23 ENCOUNTER — Encounter: Payer: Medicare Other | Admitting: Speech Pathology

## 2019-06-23 ENCOUNTER — Encounter: Payer: Self-pay | Admitting: Family Medicine

## 2019-06-23 DIAGNOSIS — G3184 Mild cognitive impairment, so stated: Secondary | ICD-10-CM

## 2019-06-23 DIAGNOSIS — J4520 Mild intermittent asthma, uncomplicated: Secondary | ICD-10-CM

## 2019-06-23 DIAGNOSIS — Z789 Other specified health status: Secondary | ICD-10-CM

## 2019-06-23 DIAGNOSIS — Z9189 Other specified personal risk factors, not elsewhere classified: Secondary | ICD-10-CM | POA: Insufficient documentation

## 2019-06-23 DIAGNOSIS — IMO0001 Reserved for inherently not codable concepts without codable children: Secondary | ICD-10-CM

## 2019-06-23 DIAGNOSIS — J4550 Severe persistent asthma, uncomplicated: Secondary | ICD-10-CM | POA: Insufficient documentation

## 2019-06-23 DIAGNOSIS — Z0000 Encounter for general adult medical examination without abnormal findings: Secondary | ICD-10-CM | POA: Insufficient documentation

## 2019-06-23 HISTORY — DX: Mild intermittent asthma, uncomplicated: J45.20

## 2019-06-23 HISTORY — DX: Mild cognitive impairment of uncertain or unknown etiology: G31.84

## 2019-06-23 HISTORY — DX: Other specified health status: Z78.9

## 2019-06-23 HISTORY — DX: Reserved for inherently not codable concepts without codable children: IMO0001

## 2019-06-23 NOTE — Addendum Note (Signed)
Addended by: Lissa Morales D on: 06/23/2019 09:25 AM   Modules accepted: Orders

## 2019-06-23 NOTE — Assessment & Plan Note (Signed)
Uncertain if patient is taking Bentyl. Patient not taking rosuvastatin.  Scheduled appointment with [facility] clinic for a "[name]" review to reconcile what patient is and is not taking.

## 2019-06-23 NOTE — Assessment & Plan Note (Signed)
Established problem Controlled Adverse effects of omeprazole are dose dependent.  Consider options for de-prescribing omeprazole: 1) decrease to omeprazole 20mg  daily, 2) stopping omeprazoleimmediately, 3) Change to H2B with CaCO3 for breakthrough indigestion

## 2019-06-23 NOTE — Assessment & Plan Note (Signed)
Established problem Uncontrolled Patient has not been taking the rosuvastatin   Given know symptomatic PAD, if cannot tolerate at least a moderate statin with ezetimibe, then consider referral to [place] clinic for possible PCSK-9 inhibitor tx.

## 2019-06-23 NOTE — Assessment & Plan Note (Addendum)
New chronic problem Progressive decline in Cognitive testing scores from MMSE 29 to adjusted MoCA-BLIND 20. Preserved iADL & ADL abilities  Discussed nature of Mild Cognitive Impairment (Amnestic type), 5-10 percent annual risk progression to dementia as evidenced in impairment in patient's ability to perform iADLs due to cognitive issues.   Discussed role of diet, exercise and participation in social learning activities as helpful delaying or preventing onset of dementia.   Chronic Care Management is working with Mrs and Mr Torgerson to get copies of advanced directives for EMR.    Given that Mrs Maish's combination of balance impairment, oropharyngeal swallow difficulty, and Speech Therapy's concern for verbal apraxia could represent a neurological motor syndrome.

## 2019-06-23 NOTE — Assessment & Plan Note (Addendum)
Physical Exam Today's Vitals   06/19/19 1408  BP: 122/70  Pulse: 71  SpO2: 98%  Weight: 133 lb (60.3 kg)  Height: 4\' 11"  (1.499 m)  PainSc: 0-No pain   Body mass index is 26.86 kg/m.  @10RELATIVEDAYS @No  data found. Wt Readings from Last 3 Encounters:  06/19/19 133 lb (60.3 kg)  04/28/19 133 lb 4 oz (60.4 kg)  04/08/19 131 lb 3.2 oz (59.5 kg)   General: alert, cooperative and groomed, HEENT:EAR: normal TMs bilaterally Heart: S1, S2 normal, no murmur, rub or gallop, regular rate and rhythm Neuro:  Stance: Loss of balance with eyes closed  Assessment Multifactorial origin: Presbystasis, abnormal gait, abnormal stance, slow pace, painful feet (pes planus), shoes with narrow bases, possibly anticholinergic medication use (dicyclomine), aging baroreceptor with impaired response to challenges  Plan Medication change: Ensure patient is not taking Bentyl.  Referral to Physical Therapy at [facility]  Referral to podiatry Firm soled shoes with wider soles, e.g. running shoes Slow transitions with change in posture Medication reconciliation between EMR and home, and stopping anticholinergic meds, like dicyclomine, if possible.

## 2019-06-23 NOTE — Patient Instructions (Signed)
Dear Anne Warren,  It was a pleasure getting to meet you and your husband.   You gave a good effort during your visit to the [facility].  I believe our geriatrics team are able to give some you some helpful information and recommendations.

## 2019-06-23 NOTE — Assessment & Plan Note (Signed)
Recommend Shingriz vaccination series.   Patient agrees.  Shingrix vaccination series orders sent to patient's pharmacy.

## 2019-06-24 ENCOUNTER — Encounter: Payer: Self-pay | Admitting: Allergy and Immunology

## 2019-06-24 ENCOUNTER — Other Ambulatory Visit: Payer: Self-pay

## 2019-06-24 ENCOUNTER — Ambulatory Visit: Payer: Medicare Other | Admitting: Allergy and Immunology

## 2019-06-24 VITALS — BP 126/60 | HR 75 | Temp 97.6°F | Resp 16 | Ht 59.5 in | Wt 133.6 lb

## 2019-06-24 DIAGNOSIS — J3089 Other allergic rhinitis: Secondary | ICD-10-CM

## 2019-06-24 DIAGNOSIS — J4550 Severe persistent asthma, uncomplicated: Secondary | ICD-10-CM | POA: Diagnosis not present

## 2019-06-24 DIAGNOSIS — D7219 Other eosinophilia: Secondary | ICD-10-CM
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
## 2019-06-24 NOTE — Patient Instructions (Addendum)
  1. Continue Benralizumab injections (EpiPen)  2. If needed:   A. albuterol nebulization or ProAir HFA 2 puffs every 4-6 hours  B. OTC cetirizine 10 mg tablet 1 time per day  3. Return to clinic in 6 months or earlier if problem

## 2019-06-25 ENCOUNTER — Encounter: Payer: Medicare Other | Admitting: Speech Pathology

## 2019-06-25 ENCOUNTER — Encounter: Payer: Self-pay | Admitting: Allergy and Immunology

## 2019-07-01 ENCOUNTER — Telehealth: Payer: Self-pay | Admitting: *Deleted

## 2019-07-01 NOTE — Telephone Encounter (Signed)
-----   Message from Blane Ohara McDiarmid, MD sent at 06/23/2019  8:52 AM EDT ----- Regarding: Schedule pt appointment with Pharmacy Clinic. Please schedule Ms Lutey to come in for a "[name]" review of all her medications.  Please talk to her and her husband, asking them to bring in all the medications, vitamins, supplements Mrs Shugar has to this visit.  It does not matter if she thinks she is taking them or not.    Thank you Sherren Mocha

## 2019-07-01 NOTE — Telephone Encounter (Signed)
Spoke with pt through interpreter Isreal [phone]. Informed pt to bring all medications to appt on 6/3 including vitamins and supplements. Pt also knows that she has an appt with Dr. Ouida Sills on 6/21. Pt understood to bring all medications to her 6/3 appt with Dr. Valentina Lucks. Salvatore Marvel, CMA

## 2019-07-01 NOTE — Telephone Encounter (Signed)
Unable to leave a message for patient due to her voicemail not being set up.  Will try calling again to schedule an appointment with Dr. Valentina Lucks before her appt with Dr. Ouida Sills for medication management.  Sameul Tagle,CMA

## 2019-07-03 ENCOUNTER — Encounter: Payer: Self-pay | Admitting: Pharmacist

## 2019-07-03 ENCOUNTER — Other Ambulatory Visit: Payer: Self-pay

## 2019-07-03 ENCOUNTER — Ambulatory Visit (INDEPENDENT_AMBULATORY_CARE_PROVIDER_SITE_OTHER): Payer: Medicare Other | Admitting: Pharmacist

## 2019-07-03 VITALS — Ht 59.0 in | Wt 132.4 lb

## 2019-07-03 DIAGNOSIS — Z0000 Encounter for general adult medical examination without abnormal findings: Secondary | ICD-10-CM

## 2019-07-03 DIAGNOSIS — K219 Gastro-esophageal reflux disease without esophagitis: Secondary | ICD-10-CM

## 2019-07-03 MED ORDER — OMEPRAZOLE 40 MG PO CPDR: 40.0000 mg | DELAYED_RELEASE_CAPSULE | Freq: Every day | ORAL | Status: DC | PRN

## 2019-07-03 MED ORDER — FAMOTIDINE 20 MG PO TABS: 20.0000 mg | ORAL_TABLET | Freq: Two times a day (BID) | ORAL | 3 refills | Status: DC

## 2019-07-03 MED ORDER — ZOSTER VAC RECOMB ADJUVANTED 50 MCG/0.5ML IM SUSR: 0.5000 mL | Freq: Once | INTRAMUSCULAR | 1 refills | Status: AC

## 2019-07-03 MED ORDER — DICYCLOMINE HCL 10 MG PO CAPS: 10.0000 mg | ORAL_CAPSULE | Freq: Two times a day (BID) | ORAL | Status: DC | PRN

## 2019-07-03 NOTE — Patient Instructions (Addendum)
Great to meet you today!  Please stop taking Dyclomine and Omeprazole.    START taking famotidine 20mg  twice daily.  This medication should help with reflux and stomach pains.  You can restart either of these medications if you have a recurrence of severe symptoms.   If you have severe reflux please restart Omeprazole once daily for several days until symptoms of reflux improve.  If you have severe belly spasms please restart the Dicyclomine twice daily.   Follow-up with Dr. Ouida Sills.      Encantado de conocerte hoy!  Deje de tomar Dyclomine y Omeprazol.  COMIENCE a tomar 20 mg de famotidina dos veces al da. Este medicamento debera ayudar con el reflujo y los dolores de [place].  Puede reiniciar cualquiera de estos medicamentos si tiene una recurrencia de sntomas graves.  Si tiene reflujo severo, reinicie Omeprazol una [place] hasta que mejoren los sntomas del reflujo. Si tiene espasmos abdominales severos, reinicie Teacher, early years/pre.  Seguimiento con el Dr. Ouida Sills

## 2019-07-03 NOTE — Assessment & Plan Note (Signed)
A drug regimen assessment was performed, including review of allergies, interactions, disease-state management, dosing and immunization history. All medications are dosed appropriately according to renal function. Medications were reviewed with the patient, including name, instructions, indication, goals of therapy, potential side effects, importance of adherence, and safe use.  Initiated - Famotidine 20 mg PO BID for reflux.  Discontinued - Gabapentin solution 200 mg (4 mL) at bedtime. Pt finished bottle and unwilling to take any more gabapentin because it makes her sleepy & dizzy. Pt will f/u with podiatrist.  Changed - Omeprazole and Dicyclomine changed to PRN due to concerns about side effects with pt's age. Pt was instructed to stop taking but to keep at home. Can still use if symptoms of reflux or stomach spasms return.

## 2019-07-03 NOTE — Progress Notes (Signed)
° °  Patient presents today with her husband (permission given) for medication reconciliation. Husband is interpreter and historian for patient. Patient completed gabapentin solution. She reports she still experiences neuropathy. She will be seeing physician for further management of neuropathy soon. Patient reports receiving Berna Bue every 2 months, which has helped with her breathing. She has discontinued Symbicort due to [facility] efficacy.  Objective:  Lab Results  Component Value Date   HGBA1C 5.4 04/08/2019    Lipid Panel     Component Value Date/Time   CHOL 204 (H) 04/08/2019 1627   TRIG 211 (H) 04/08/2019 1627   HDL 39 (L) 04/08/2019 1627   CHOLHDL 5.2 (H) 04/08/2019 1627   CHOLHDL 5.1 (H) 03/19/2015 0936   VLDL 43 (H) 03/19/2015 0936   LDLCALC 127 (H) 04/08/2019 1627   LDLDIRECT 143 (H) 06/01/2008 2123   LABVLDL 38 04/08/2019 1627    BMP Latest Ref Rng & Units 04/08/2019 09/12/2018 04/20/2017  Glucose 65 - 99 mg/dL 90 101(H) 85  BUN 8 - 27 mg/dL 13 16 13   Creatinine 0.57 - 1.00 mg/dL 0.94 0.70 0.76  BUN/Creat Ratio 12 - 28 14 - 17  Sodium 134 - 144 mmol/L 140 140 142  Potassium 3.5 - 5.2 mmol/L 4.4 4.0 5.3(H)  Chloride 96 - 106 mmol/L 102 105 102  CO2 20 - 29 mmol/L 24 28 24   Calcium 8.7 - 10.3 mg/dL 10.0 9.6 10.0    Immunization History  Administered Date(s) Administered   Influenza Split 12/13/2011   Influenza Whole 11/27/2006, 11/14/2007, 11/09/2008, 11/01/2009   Influenza,inj,Quad PF,6+ Mos 10/22/2012, 10/20/2013, 01/06/2015, 10/07/2015, 11/23/2016, 10/31/2017, 10/02/2018   PFIZER SARS-COV-2 Vaccination 03/08/2019, 04/02/2019   Pneumococcal Conjugate-13 10/07/2015   Pneumococcal Polysaccharide-23 11/14/2007   Td 11/30/2004   Tdap 04/23/2017      Assessment/Plan:   Medication Reconciliation A drug regimen assessment was performed, including review of allergies, interactions, disease-state management, dosing and immunization history. All medications are  dosed appropriately according to renal function. Medications were reviewed with the patient, including name, instructions, indication, goals of therapy, potential side effects, importance of adherence, and safe use.  Initiated - Famotidine 20 mg PO BID for reflux.  Discontinued - Gabapentin solution 200 mg (4 mL) at bedtime. Pt finished bottle and unwilling to take any more gabapentin because it makes her sleepy & dizzy. Pt will f/u with podiatrist.  Changed - Omeprazole and Dicyclomine changed to PRN due to concerns about side effects with pt's age. Pt was instructed to stop taking but to keep at home. Can still use if symptoms of reflux or stomach spasms return.  Vaccination Patient is indicated for Shingles vaccination. Sent in rx to patient's preferred pharmacy.   This appointment required 60 minutes of patient care (this includes precharting, chart review, review of results, face-to-face care, etc.).  Patient seen with Esmeralda Links, PharmD Candidate, Richardine Service, PharmD PGY-1 Resident. and Drexel Iha, PharmD, PGY2 Pharmacy Resident.

## 2019-07-04 NOTE — Progress Notes (Signed)
Reviewed: I agree with Dr. Koval's documentation and management. 

## 2019-07-08 ENCOUNTER — Ambulatory Visit: Payer: Medicare Other | Admitting: Podiatry

## 2019-07-08 ENCOUNTER — Ambulatory Visit (INDEPENDENT_AMBULATORY_CARE_PROVIDER_SITE_OTHER): Payer: Medicare Other

## 2019-07-08 ENCOUNTER — Other Ambulatory Visit: Payer: Self-pay

## 2019-07-08 VITALS — Temp 97.1°F

## 2019-07-08 DIAGNOSIS — R269 Unspecified abnormalities of gait and mobility: Secondary | ICD-10-CM

## 2019-07-08 DIAGNOSIS — M722 Plantar fascial fibromatosis: Secondary | ICD-10-CM

## 2019-07-08 DIAGNOSIS — I739 Peripheral vascular disease, unspecified: Secondary | ICD-10-CM

## 2019-07-08 DIAGNOSIS — M2141 Flat foot [pes planus] (acquired), right foot: Secondary | ICD-10-CM

## 2019-07-08 DIAGNOSIS — M2142 Flat foot [pes planus] (acquired), left foot: Secondary | ICD-10-CM | POA: Diagnosis not present
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
## 2019-07-14 ENCOUNTER — Telehealth: Payer: Self-pay | Admitting: *Deleted

## 2019-07-14 NOTE — Telephone Encounter (Signed)
-----   Message from Trula Slade, DPM sent at 07/13/2019  8:41 AM EDT ----- She has previously seen vascular surgery but her PAD symptoms seem to be getting worse. Can you have her follow back up? She was seen in October it looks like. Thanks.

## 2019-07-14 NOTE — Telephone Encounter (Signed)
Unable to leave a message voicemail box is not set up. Mailed letter informing pt of Dr. Leigh Aurora review of results and to follow up with her cardiovascular physician -[facility].

## 2019-07-21 ENCOUNTER — Other Ambulatory Visit: Payer: Self-pay

## 2019-07-21 ENCOUNTER — Ambulatory Visit: Payer: Medicare Other | Admitting: Student in an Organized Health Care Education/Training Program

## 2019-07-21 MED ORDER — ROSUVASTATIN CALCIUM 5 MG PO TABS: 5.0000 mg | ORAL_TABLET | Freq: Every day | ORAL | 1 refills | Status: DC

## 2019-07-28 ENCOUNTER — Encounter: Payer: Self-pay | Admitting: Student in an Organized Health Care Education/Training Program

## 2019-07-28 ENCOUNTER — Ambulatory Visit (INDEPENDENT_AMBULATORY_CARE_PROVIDER_SITE_OTHER): Payer: Medicare Other | Admitting: Student in an Organized Health Care Education/Training Program

## 2019-07-28 ENCOUNTER — Other Ambulatory Visit: Payer: Self-pay

## 2019-07-28 DIAGNOSIS — K219 Gastro-esophageal reflux disease without esophagitis: Secondary | ICD-10-CM

## 2019-07-28 DIAGNOSIS — I70213 Atherosclerosis of native arteries of extremities with intermittent claudication, bilateral legs: Secondary | ICD-10-CM

## 2019-07-28 MED ORDER — ROSUVASTATIN CALCIUM 5 MG PO TABS: 5.0000 mg | ORAL_TABLET | Freq: Every day | ORAL | 3 refills | Status: DC

## 2019-07-28 MED ORDER — ZOSTER VAC RECOMB ADJUVANTED 50 MCG/0.5ML IM SUSR: 0.5000 mL | Freq: Once | INTRAMUSCULAR | 0 refills | Status: AC

## 2019-07-28 NOTE — Progress Notes (Signed)
    SUBJECTIVE:   CHIEF COMPLAINT / HPI: legs hurt when walking, medication management  In-person spanish interpretor utilized throughout entire encounter  PAD- main concern. Unchanged from previous. Pain in calf bilaterally with walking, improved with rest. Has stopped following with vascular. Has not been taking pletal due to side effects. Does not take statin as does not want to take pills. Tries to roll her foot over a ball to help massage her foot but does not help.  F/u medications- per recommendations from geri clinic, we discussed discontinuing bentyl and omeprazole.  Patient was prescribed shingrix but did not remember getting the prescription so has not gotten it.   PERTINENT  PMH / PSH: cognitive decline, PAD  OBJECTIVE:   BP 135/80   Pulse 81   Ht 5' (1.524 m)   Wt 131 lb 3.2 oz (59.5 kg)   SpO2 98%   BMI 25.62 kg/m   General: NAD, pleasant, able to participate in exam Cardiac: RRR, normal heart sounds, no murmurs. 2+ radial and 1+ PT pulses bilaterally Respiratory: CTAB, normal effort, No wheezes, rales or rhonchi Abdomen: soft, nontender, nondistended, no hepatic or splenomegaly, +BS Extremities: no edema or cyanosis. WWP. Venous congestion bilateral LEs Skin: warm and dry, no rashes noted Neuro: alert and oriented x4, no focal deficits Psych: Normal affect and mood  ASSESSMENT/PLAN:   Atherosclerosis of native arteries of extremity with intermittent claudication ([place]) Counseled patient on statin use. She agrees to take the medication daily now. She was able to tolerate well previously. - refill rosuvastatin 5mg . Consider increasing dose if well tolerated. - repeat lipid panel at next visit - recommended compression stockings - offered PT/supervised exercise to patient who declined. Instead, we discussed home exercises she can do with a resistance band and encouraged walking to her toleration  GERD Discontinued omeprazole Continue Pepcid PRN     Richarda Osmond, [place]

## 2019-07-28 NOTE — Patient Instructions (Signed)
It was a pleasure to see you today!  To summarize our discussion for this visit:  I would recommend that you take your cholesterol medication and do some strengthening exercises to help with your leg pain. You can also try to use compression stockings.  I will remove omeprazole and bentyl from your medication list.  Please get your shingles vaccine at your pharmacy. I am giving you the prescription today  Some additional health maintenance measures we should update are: Health Maintenance Due  Topic Date Due  . Hepatitis C Screening  Never done  .    Please return to our clinic to see me in about 6 months.  Call the clinic at [phone] if your symptoms worsen or you have any concerns.   Thank you for allowing me to take part in your care,  Dr. Doristine [place]

## 2019-07-30 NOTE — Assessment & Plan Note (Signed)
Discontinued omeprazole Continue Pepcid PRN

## 2019-07-30 NOTE — Assessment & Plan Note (Signed)
Counseled patient on statin use. She agrees to take the medication daily now. She was able to tolerate well previously. - refill rosuvastatin 5mg . Consider increasing dose if well tolerated. - repeat lipid panel at next visit - recommended compression stockings - offered PT/supervised exercise to patient who declined. Instead, we discussed home exercises she can do with a resistance band and encouraged walking to her toleration

## 2019-08-13 ENCOUNTER — Ambulatory Visit (INDEPENDENT_AMBULATORY_CARE_PROVIDER_SITE_OTHER): Payer: Medicare Other

## 2019-08-13 ENCOUNTER — Other Ambulatory Visit: Payer: Self-pay

## 2019-08-13 DIAGNOSIS — J4550 Severe persistent asthma, uncomplicated: Secondary | ICD-10-CM | POA: Diagnosis not present

## 2019-08-26 ENCOUNTER — Ambulatory Visit (HOSPITAL_COMMUNITY)
Admission: RE | Admit: 2019-08-26 | Discharge: 2019-08-26 | Disposition: A | Discharge status: Home / Self Care | Payer: Medicare Other | Source: Ambulatory Visit | Attending: Vascular Surgery | Admitting: Vascular Surgery

## 2019-08-26 ENCOUNTER — Ambulatory Visit (INDEPENDENT_AMBULATORY_CARE_PROVIDER_SITE_OTHER): Payer: Medicare Other | Admitting: Physician Assistant

## 2019-08-26 ENCOUNTER — Other Ambulatory Visit: Payer: Self-pay

## 2019-08-26 VITALS — BP 149/82 | HR 61 | Temp 98.1°F | Resp 20 | Ht 60.0 in | Wt 127.8 lb

## 2019-08-26 DIAGNOSIS — I70219 Atherosclerosis of native arteries of extremities with intermittent claudication, unspecified extremity: Secondary | ICD-10-CM

## 2019-08-26 DIAGNOSIS — I70213 Atherosclerosis of native arteries of extremities with intermittent claudication, bilateral legs: Secondary | ICD-10-CM
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
## 2019-08-28 ENCOUNTER — Ambulatory Visit (HOSPITAL_COMMUNITY)
Admission: RE | Admit: 2019-08-28 | Discharge: 2019-08-28 | Disposition: A | Discharge status: Home / Self Care | Payer: Medicare Other | Attending: Vascular Surgery | Admitting: Vascular Surgery

## 2019-08-28 ENCOUNTER — Other Ambulatory Visit: Payer: Self-pay

## 2019-08-28 ENCOUNTER — Encounter (HOSPITAL_COMMUNITY): Payer: Self-pay | Admitting: Vascular Surgery

## 2019-08-28 ENCOUNTER — Encounter (HOSPITAL_COMMUNITY)
Admission: RE | Disposition: A | Discharge status: Home / Self Care | Payer: Self-pay | Source: Home / Self Care | Attending: Vascular Surgery

## 2019-08-28 DIAGNOSIS — Z886 Allergy status to analgesic agent status: Secondary | ICD-10-CM | POA: Insufficient documentation

## 2019-08-28 DIAGNOSIS — Z79899 Other long term (current) drug therapy: Secondary | ICD-10-CM | POA: Diagnosis not present

## 2019-08-28 DIAGNOSIS — J4520 Mild intermittent asthma, uncomplicated: Secondary | ICD-10-CM | POA: Insufficient documentation

## 2019-08-28 DIAGNOSIS — Z9049 Acquired absence of other specified parts of digestive tract: Secondary | ICD-10-CM | POA: Diagnosis not present

## 2019-08-28 DIAGNOSIS — Z87891 Personal history of nicotine dependence: Secondary | ICD-10-CM | POA: Insufficient documentation

## 2019-08-28 DIAGNOSIS — I10 Essential (primary) hypertension: Secondary | ICD-10-CM | POA: Diagnosis not present

## 2019-08-28 DIAGNOSIS — I70211 Atherosclerosis of native arteries of extremities with intermittent claudication, right leg: Secondary | ICD-10-CM | POA: Insufficient documentation

## 2019-08-28 DIAGNOSIS — I2510 Atherosclerotic heart disease of native coronary artery without angina pectoris: Secondary | ICD-10-CM | POA: Diagnosis not present

## 2019-08-28 DIAGNOSIS — Z8249 Family history of ischemic heart disease and other diseases of the circulatory system: Secondary | ICD-10-CM | POA: Insufficient documentation

## 2019-08-28 DIAGNOSIS — Z888 Allergy status to other drugs, medicaments and biological substances status: Secondary | ICD-10-CM | POA: Insufficient documentation

## 2019-08-28 DIAGNOSIS — E7800 Pure hypercholesterolemia, unspecified: Secondary | ICD-10-CM | POA: Insufficient documentation

## 2019-08-28 DIAGNOSIS — K219 Gastro-esophageal reflux disease without esophagitis: Secondary | ICD-10-CM | POA: Insufficient documentation

## 2019-08-28 HISTORY — PX: LOWER EXTREMITY ANGIOGRAPHY: CATH118251

## 2019-08-28 LAB — POCT I-STAT, CHEM 8
BUN: 15 mg/dL (ref 8–23)
Calcium, Ion: 1.23 mmol/L (ref 1.15–1.40)
Chloride: 106 mmol/L (ref 98–111)
Creatinine, Ser: 0.7 mg/dL (ref 0.44–1.00)
Glucose, Bld: 97 mg/dL (ref 70–99)
HCT: 42 % (ref 36.0–46.0)
Hemoglobin: 14.3 g/dL (ref 12.0–15.0)
Potassium: 3.8 mmol/L (ref 3.5–5.1)
Sodium: 144 mmol/L (ref 135–145)
TCO2: 25 mmol/L (ref 22–32)

## 2019-08-28 LAB — NO BLOOD PRODUCTS

## 2019-08-28 SURGERY — LOWER EXTREMITY ANGIOGRAPHY
Anesthesia: LOCAL | Laterality: Right

## 2019-08-28 MED ORDER — IODIXANOL 320 MG/ML IV SOLN
INTRAVENOUS | Status: DC | PRN
  Administered 2019-08-28: 100 mL via INTRA_ARTERIAL

## 2019-08-28 MED ORDER — SODIUM CHLORIDE 0.9 % IV SOLN: INTRAVENOUS | Status: DC

## 2019-08-28 MED ORDER — ONDANSETRON HCL 4 MG/2ML IJ SOLN: 4.0000 mg | Freq: Four times a day (QID) | INTRAMUSCULAR | Status: DC | PRN

## 2019-08-28 MED ORDER — LIDOCAINE HCL (PF) 1 % IJ SOLN
INTRAMUSCULAR | Status: DC | PRN
  Administered 2019-08-28: 15 mL via INTRADERMAL

## 2019-08-28 MED ORDER — SODIUM CHLORIDE 0.9 % IV SOLN: INTRAVENOUS | Status: AC

## 2019-08-28 MED ORDER — HEPARIN (PORCINE) IN NACL 1000-0.9 UT/500ML-% IV SOLN
INTRAVENOUS | Status: DC | PRN
  Administered 2019-08-28 (×2): 500 mL

## 2019-08-28 MED ORDER — FENTANYL CITRATE (PF) 100 MCG/2ML IJ SOLN
INTRAMUSCULAR | Status: AC
  Filled 2019-08-28: qty 2

## 2019-08-28 MED ORDER — LABETALOL HCL 5 MG/ML IV SOLN: 10.0000 mg | INTRAVENOUS | Status: DC | PRN

## 2019-08-28 MED ORDER — HEPARIN (PORCINE) IN NACL 1000-0.9 UT/500ML-% IV SOLN
INTRAVENOUS | Status: AC
  Filled 2019-08-28: qty 1000

## 2019-08-28 MED ORDER — HYDRALAZINE HCL 20 MG/ML IJ SOLN: 5.0000 mg | INTRAMUSCULAR | Status: DC | PRN

## 2019-08-28 MED ORDER — FENTANYL CITRATE (PF) 100 MCG/2ML IJ SOLN
INTRAMUSCULAR | Status: DC | PRN
  Administered 2019-08-28: 50 ug via INTRAVENOUS

## 2019-08-28 MED ORDER — LIDOCAINE HCL (PF) 1 % IJ SOLN
INTRAMUSCULAR | Status: AC
  Filled 2019-08-28: qty 30

## 2019-08-28 MED ORDER — MIDAZOLAM HCL 2 MG/2ML IJ SOLN
INTRAMUSCULAR | Status: AC
  Filled 2019-08-28: qty 2

## 2019-08-28 MED ORDER — MIDAZOLAM HCL 2 MG/2ML IJ SOLN
INTRAMUSCULAR | Status: DC | PRN
  Administered 2019-08-28: 1 mg via INTRAVENOUS

## 2019-08-28 SURGICAL SUPPLY — 9 items
CATH OMNI FLUSH 5F 65CM (CATHETERS) ×2 IMPLANT
KIT MICROPUNCTURE NIT STIFF (SHEATH) ×2 IMPLANT
KIT PV (KITS) ×2 IMPLANT
SHEATH PINNACLE 5F 10CM (SHEATH) ×2 IMPLANT
SHEATH PROBE COVER 6X72 (BAG) ×2 IMPLANT
SYR MEDRAD MARK 7 150ML (SYRINGE) ×2 IMPLANT
TRANSDUCER W/STOPCOCK (MISCELLANEOUS) ×2 IMPLANT
TRAY PV CATH (CUSTOM PROCEDURE TRAY) ×2 IMPLANT
WIRE BENTSON .035X145CM (WIRE) ×2 IMPLANT

## 2019-08-28 NOTE — Discharge Instructions (Signed)
Cuidado del sitio femoral Femoral Site Care Esta hoja le brinda informacin sobre cmo cuidarse despus del procedimiento. El mdico tambin podr darle indicaciones ms especficas. Comunquese con su mdico si tiene problemas o preguntas. Qu puedo esperar despus del procedimiento? Despus del procedimiento, es comn tener los siguientes sntomas:  Hematomas que suelen desaparecer en el trmino de 1 a 2semanas.  Sensibilidad al tacto en el lugar. Siga estas indicaciones en su casa: Cuidado de la herida  Siga las indicaciones del mdico acerca del cuidado del lugar de la insercin. Asegrese de hacer lo siguiente: ? Lvese las manos con agua y jabn antes de cambiar las vendas (vendajes). Use desinfectante para manos si no dispone de agua y jabn. ? Cambie los vendajes como se lo haya indicado el mdico. ? No retire los puntos (suturas), la goma para cerrar la piel o las tiras adhesivas. Es posible que estos cierres cutneos deban permanecer en la piel durante 2semanas o ms. Si los bordes de las tiras adhesivas empiezan a despegarse y enroscarse, puede recortar los que estn sueltos. No retire las tiras adhesivas por completo a menos que el mdico se lo indique.  No tome baos de inmersin, no nade ni use el jacuzzi hasta que el mdico lo autorice.  Puede ducharse entre 24y 48horas despus del procedimiento o como se lo haya indicado el mdico. ? Lave el lugar delicadamente con agua y jabn comn. ? Seque bien el rea con una toalla limpia dando golpecitos. ? No se frote en el lugar. Puede ocasionarle una hemorragia.  No se aplique talcos ni lociones en el lugar. Mantenga el lugar limpio y seco.  Controle el lugar femoral todos los das para detectar signos de infeccin. Est atento a los siguientes signos: ? Dolor, hinchazn o enrojecimiento. ? Lquido o sangre. ? Calor. ? Pus o mal olor. Actividad  Durante los primeros 2 o 3 das despus del procedimiento, o como se lo haya  indicado el mdico: ? Evite subir escaleras siempre que pueda. ? No se ponga en cuclillas.  No levante ningn objeto que pese ms de 10libras (4,5kg) o el lmite de peso que le hayan indicado, hasta que el mdico le diga que puede hacerlo.  Haga reposo como se le indic. ? Evite estar sentado durante largos perodos sin moverse. Levntese y camine un poco cada 1 a 2 horas.  No conduzca durante 24horas si le dieron un medicamento para ayudarlo a que se relaje (sedante). Indicaciones generales  Tome los medicamentos de venta libre y los recetados solamente como se lo haya indicado el mdico.  Concurra a todas las visitas de seguimiento como se lo haya indicado el mdico. Esto es importante. Comunquese con un mdico si tiene:  Fiebre o escalofros.  Tiene enrojecimiento, hinchazn o dolor alrededor del lugar de la insercin. Solicite ayuda de inmediato si:  La zona de insercin del catter se hincha muy rpido.  Se desmaya.  Comienza a sudar o la piel se humedece de manera repentina.  La zona de insercin del catter sangra, y la hemorragia no se detiene cuando ejerce presin constante en la zona.  La zona que est cerca o a poca distancia del lugar de insercin del catter se pone plida o fra, o siente hormigueo o adormecimiento. Estos sntomas pueden representar un problema grave que constituye una emergencia. No espere hasta que los sntomas desaparezcan. Solicite atencin mdica de inmediato. Comunquese con el servicio de emergencias de su localidad (911 en los [place]). No conduzca por   sus propios medios hasta el hospital. Resumen  Despus del procedimiento, es frecuente tener hematomas que suelen desaparecer en el trmino de 1 a 2 semanas.  Controle el lugar femoral todos los das para detectar signos de infeccin.  No levante ningn objeto que pese ms de 10libras (4,5kg) o el lmite de peso que le hayan indicado, hasta que el mdico le diga que puede  hacerlo. Esta informacin no tiene como fin reemplazar el consejo del mdico. Asegrese de hacerle al mdico cualquier pregunta que tenga. Document Revised: 03/08/2017 Document Reviewed: 03/08/2017 Elsevier Patient Education  2020 Elsevier Inc. Femoral Site Care This sheet gives you information about how to care for yourself after your procedure. Your health care provider may also give you more specific instructions. If you have problems or questions, contact your health care provider. What can I expect after the procedure? After the procedure, it is common to have:  Bruising that usually fades within 1-2 weeks.  Tenderness at the site. Follow these instructions at home: Wound care  Follow instructions from your health care provider about how to take care of your insertion site. Make sure you: ? Wash your hands with soap and water before you change your bandage (dressing). If soap and water are not available, use hand sanitizer. ? Change your dressing as told by your health care provider. ? Leave stitches (sutures), skin glue, or adhesive strips in place. These skin closures may need to stay in place for 2 weeks or longer. If adhesive strip edges start to loosen and curl up, you may trim the loose edges. Do not remove adhesive strips completely unless your health care provider tells you to do that.  Do not take baths, swim, or use a hot tub until your health care provider approves.  You may shower 24-48 hours after the procedure or as told by your health care provider. ? Gently wash the site with plain soap and water. ? Pat the area dry with a clean towel. ? Do not rub the site. This may cause bleeding.  Do not apply powder or lotion to the site. Keep the site clean and dry.  Check your femoral site every day for signs of infection. Check for: ? Redness, swelling, or pain. ? Fluid or blood. ? Warmth. ? Pus or a bad smell. Activity  For the first 2-3 days after your procedure, or  as long as directed: ? Avoid climbing stairs as much as possible. ? Do not squat.  Do not lift anything that is heavier than 10 lb (4.5 kg), or the limit that you are told, until your health care provider says that it is safe.  Rest as directed. ? Avoid sitting for a long time without moving. Get up to take short walks every 1-2 hours.  Do not drive for 24 hours if you were given a medicine to help you relax (sedative). General instructions  Take over-the-counter and prescription medicines only as told by your health care provider.  Keep all follow-up visits as told by your health care provider. This is important. Contact a health care provider if you have:  A fever or chills.  You have redness, swelling, or pain around your insertion site. Get help right away if:  The catheter insertion area swells very fast.  You pass out.  You suddenly start to sweat or your skin gets clammy.  The catheter insertion area is bleeding, and the bleeding does not stop when you hold steady pressure on the area.    The area near or just beyond the catheter insertion site becomes pale, cool, tingly, or numb. These symptoms may represent a serious problem that is an emergency. Do not wait to see if the symptoms will go away. Get medical help right away. Call your local emergency services (911 in the [place] Do not drive yourself to the hospital. Summary  After the procedure, it is common to have bruising that usually fades within 1-2 weeks.  Check your femoral site every day for signs of infection.  Do not lift anything that is heavier than 10 lb (4.5 kg), or the limit that you are told, until your health care provider says that it is safe. This information is not intended to replace advice given to you by your health care provider. Make sure you discuss any questions you have with your health care provider. Document Revised: 01/29/2017 Document Reviewed: 01/29/2017 Elsevier Patient Education  2020  Elsevier Inc.  

## 2019-08-28 NOTE — Progress Notes (Signed)
Site area: Left groin a 5 french arterial sheath was removed  Site Prior to Removal:  Level 0  Pressure Applied For 20 MINUTES    Bedrest Beginning at 0900am  Manual:   Yes.    Patient Status During Pull:  stable  Post Pull Groin Site:  Level 0  Post Pull Instructions Given:  Yes.    Post Pull Pulses Present:  Yes.    Dressing Applied:  Yes.    Comments:

## 2019-08-28 NOTE — Op Note (Signed)
Patient name: Anne Warren MRN: [id number] DOB: 03-08-1940 Sex: female  08/28/2019 Pre-operative Diagnosis: Right leg claudication Post-operative diagnosis:  Same Surgeon:  [name], MD Procedure Performed: 1.  Ultrasound-guided access of left common femoral artery 2.  Aortogram including catheter selection of aorta 3.  Bilateral lower extremity arteriogram with runoff 4.  21 minutes of monitored moderate conscious sedation time  Indications: 79 year old female who was recently was seen in follow-up for worsening right leg claudication that is now lifestyle limiting.  Her ABIs are 0.5.  She presents today for planned arteriogram possible intervention after risk benefits discussed.  Findings:  Aortogram showed patent renal arteries bilaterally with no flow-limiting stenosis in the aortoiliac segment.  On the right she has a patent common femoral and profunda.  Her SFA is diffusely diseased with one focal high-grade stenosis in the mid segment and the above-knee popliteal artery is patent.  Her below-knee popliteal artery is occluded as well as the tibial trifurcation is occluded.  The right posterior tibial and anterior tibial artery are completely occluded.  She does reconstitute a peroneal in the mid calf as the only vessel distally that is identified.  On the left she has a patent common femoral and profunda.  Her SFA is diffusely diseased with high grade focal stenosis at [facility] canal.  She has a high-grade stenosis in the above-knee popliteal artery.  The below-knee popliteal artery is patent and she has two-vessel runoff via anterior tibial and peroneal.  The anterior tibial occludes distally and is very diseased.  Peroneal is the dominant runoff in the left leg.   Procedure:  The patient was identified in the holding area and taken to room 8.  The patient was then placed supine on the table and prepped and draped in the usual sterile fashion.  A time out was called.   Ultrasound was used to evaluate the left common femoral artery.  It was patent .  A digital ultrasound image was acquired.  A micropuncture needle was used to access the left common femoral artery under ultrasound guidance.  An 018 wire was advanced without resistance and a micropuncture sheath was placed.  The 018 wire was removed and a benson wire was placed.  The micropuncture sheath was exchanged for a 5 french sheath.  An omniflush catheter was advanced over the wire to the level of L-1.  An abdominal angiogram was obtained.  Next the catheter was pulled down and bilateral lower extremity runoff was obtained.  Pertinent findings are noted above.  After evaluating the images on the right I do not think she has any endovascular options for claudication given occluded below-knee popliteal artery and tibial trifurcation with severe tibial disease and only single-vessel distal target identified in the mid calf with the peroneal artery.  That point in time wires and catheters were removed.  She will be taken to holding to have the sheath removed from the left groin.  Plan: After evaluating the images on the right thing the patient would need to consider a right femoral to peroneal bypass as her only option at this time.  Given she only has claudication we discussed surgical intervention versus continued surveillance.  She wants to follow-up in 1 month and will get vein mapping at that time and discuss surgery pending her symptoms.  She states sometimes her leg hurts immediately and sometimes she can walk 30 minutes and I have asked that she pay more attention to the complexity of her symptoms.  [name], MD [facility] Office: [phone]
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
## 2019-09-08 ENCOUNTER — Ambulatory Visit: Payer: Medicare Other | Admitting: Podiatry

## 2019-09-16 ENCOUNTER — Other Ambulatory Visit: Payer: Self-pay

## 2019-09-16 ENCOUNTER — Ambulatory Visit: Payer: Medicare Other | Admitting: Neurology

## 2019-09-16 DIAGNOSIS — I70219 Atherosclerosis of native arteries of extremities with intermittent claudication, unspecified extremity: Secondary | ICD-10-CM

## 2019-09-30 ENCOUNTER — Ambulatory Visit: Payer: Medicare Other | Admitting: Vascular Surgery

## 2019-09-30 ENCOUNTER — Ambulatory Visit (HOSPITAL_COMMUNITY)
Admission: RE | Admit: 2019-09-30 | Discharge: 2019-09-30 | Disposition: A | Discharge status: Home / Self Care | Payer: Medicare Other | Source: Ambulatory Visit | Attending: Vascular Surgery | Admitting: Vascular Surgery

## 2019-09-30 ENCOUNTER — Other Ambulatory Visit: Payer: Self-pay

## 2019-09-30 ENCOUNTER — Encounter: Payer: Self-pay | Admitting: Vascular Surgery

## 2019-09-30 VITALS — BP 160/85 | HR 68 | Temp 97.2°F | Resp 14 | Ht 59.0 in | Wt 127.0 lb

## 2019-09-30 DIAGNOSIS — I70219 Atherosclerosis of native arteries of extremities with intermittent claudication, unspecified extremity: Secondary | ICD-10-CM | POA: Diagnosis not present

## 2019-09-30 DIAGNOSIS — I70213 Atherosclerosis of native arteries of extremities with intermittent claudication, bilateral legs: Secondary | ICD-10-CM | POA: Diagnosis not present
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
## 2019-10-08 ENCOUNTER — Ambulatory Visit: Payer: Self-pay

## 2019-10-14 ENCOUNTER — Ambulatory Visit: Payer: Medicare Other | Admitting: Podiatry

## 2019-10-15 ENCOUNTER — Other Ambulatory Visit: Payer: Self-pay

## 2019-10-15 ENCOUNTER — Ambulatory Visit (INDEPENDENT_AMBULATORY_CARE_PROVIDER_SITE_OTHER): Payer: Medicare Other | Admitting: *Deleted

## 2019-10-15 DIAGNOSIS — J4550 Severe persistent asthma, uncomplicated: Secondary | ICD-10-CM

## 2019-10-28 ENCOUNTER — Ambulatory Visit: Payer: Medicare Other | Admitting: Vascular Surgery

## 2019-10-28 ENCOUNTER — Other Ambulatory Visit: Payer: Self-pay

## 2019-10-28 VITALS — BP 153/81 | HR 74 | Temp 97.9°F | Resp 14 | Ht 59.0 in | Wt 129.0 lb

## 2019-10-28 DIAGNOSIS — I70213 Atherosclerosis of native arteries of extremities with intermittent claudication, bilateral legs: Secondary | ICD-10-CM | POA: Diagnosis not present
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
## 2019-10-29 ENCOUNTER — Other Ambulatory Visit: Payer: Self-pay | Admitting: *Deleted

## 2019-10-29 DIAGNOSIS — I70213 Atherosclerosis of native arteries of extremities with intermittent claudication, bilateral legs: Secondary | ICD-10-CM

## 2019-11-28 ENCOUNTER — Ambulatory Visit: Payer: Medicare Other

## 2019-12-03 ENCOUNTER — Encounter: Payer: Self-pay | Admitting: Student in an Organized Health Care Education/Training Program

## 2019-12-03 ENCOUNTER — Other Ambulatory Visit: Payer: Self-pay

## 2019-12-03 ENCOUNTER — Ambulatory Visit (INDEPENDENT_AMBULATORY_CARE_PROVIDER_SITE_OTHER): Payer: Medicare Other | Admitting: Student in an Organized Health Care Education/Training Program

## 2019-12-03 VITALS — BP 162/78 | HR 69 | Ht 59.0 in | Wt 129.0 lb

## 2019-12-03 DIAGNOSIS — I70213 Atherosclerosis of native arteries of extremities with intermittent claudication, bilateral legs: Secondary | ICD-10-CM

## 2019-12-03 DIAGNOSIS — L282 Other prurigo: Secondary | ICD-10-CM | POA: Diagnosis not present

## 2019-12-03 DIAGNOSIS — L509 Urticaria, unspecified: Secondary | ICD-10-CM

## 2019-12-03 DIAGNOSIS — Z1159 Encounter for screening for other viral diseases: Secondary | ICD-10-CM | POA: Diagnosis not present

## 2019-12-03 DIAGNOSIS — R7401 Elevation of levels of liver transaminase levels: Secondary | ICD-10-CM | POA: Diagnosis not present

## 2019-12-03 DIAGNOSIS — I10 Essential (primary) hypertension: Secondary | ICD-10-CM

## 2019-12-03 HISTORY — DX: Urticaria, unspecified: L50.9

## 2019-12-03 MED ORDER — TRIAMCINOLONE ACETONIDE 0.5 % EX OINT: 1.0000 | TOPICAL_OINTMENT | Freq: Two times a day (BID) | CUTANEOUS | 3 refills | Status: DC

## 2019-12-03 MED ORDER — CILOSTAZOL 50 MG PO TABS: 50.0000 mg | ORAL_TABLET | Freq: Every day | ORAL | 0 refills | Status: DC

## 2019-12-03 NOTE — Assessment & Plan Note (Signed)
First occurred on arm where she got covid booster two days after administration and spread to other arm and chest. very pruritic. No reaction to first two doses of vaccine. No other known changes to cause. Topical anti-itch cream helped symptoms but did not resolve rash. - prescribed topical steroid. - checking liver/kidney labs for possible contribution. AST/ALT mildly elevated at last check. - repeat lipid panel as well

## 2019-12-03 NOTE — Assessment & Plan Note (Signed)
Elevated to 162/78 today

## 2019-12-03 NOTE — Assessment & Plan Note (Addendum)
Continue statin. Recheck lipids - attempt to restart pletal at lower dose. Prescribed 50mg  daily. - continue multiple daily exercises - continue to follow up with Dr. Carlis Abbott - patient is intolerant of ASA, consider plavix - requested patient bring in a handicap parking placard application from [facility]

## 2019-12-03 NOTE — Patient Instructions (Addendum)
It was a pleasure to see you today!  To summarize our discussion for this visit:  I sent a steroid cream to your pharmacy for the rash. If it is not getting better, let me know. We are going to check your liver and thyroid today too.  We are going to check your cholesterol again today.  Bring in a handicap application form from the [facility] and I can fill this out  I prescribed pletal at a small dose for once a day to help with the leg cramping. The exercise is still very important. Please follow up with your vascular doctor as they directed.  I have also given you an advanced directive for planning. You can bring back at your next appointment.    Some additional health maintenance measures we should update are: Health Maintenance Due  Topic Date Due  . Hepatitis C Screening  Never done  . INFLUENZA VACCINE  08/31/2019  .    Please return to our clinic to see me in March.  Call the clinic at [phone] if your symptoms worsen or you have any concerns.   Thank you for allowing me to take part in your care,  Dr. Doristine Mango

## 2019-12-03 NOTE — Progress Notes (Signed)
   SUBJECTIVE:   CHIEF COMPLAINT / HPI: f/u from vascular referral, COVID booster reaction, cholesterol check  COVID booster Oct 19th. No immediate reaction to vaccine and no reaction to the first two doses. Had a localized rash occurred 2 days after receiving the vaccine and has been present since. No pain but a lot of itchhing. Thinks that it has improved mildly. Tried an anti-itch cream from the pharmacy without relief. Denies any other new foods, soap, topicals prior to onset of rash.  Rosuvastatin 5mg  daily. Would like to recheck cholesterol today  Handicap placcard. Difficulty with walking long distances due to PAD and would like parking assistance.  PAD- Was on pletal previously (100mg  BID). Had vomiting so it was discontinued. Has been walking and stationary cycling multiple times per day with some improvement in functionality. Follows closely with Dr. Carlis Abbott.  OBJECTIVE:   BP (!) 162/78   Pulse 69   Ht 4\' 11"  (1.499 m)   Wt 129 lb (58.5 kg)   SpO2 99%   BMI 26.05 kg/m   General: NAD, pleasant, able to participate in exam Cardiac: RRR, normal heart sounds, no murmurs. 2+ radial and PT pulses bilaterally Respiratory: CTAB, normal effort, No wheezes, rales or rhonchi Abdomen: soft, nontender, nondistended, no hepatic or splenomegaly, +BS Extremities: no edema. WWP. Skin: warm and dry, urticarial rash on bilateral axillary regions and distal to elbows. Also on anterior chest. Neuro: alert and oriented, no focal deficits Psych: Normal affect and mood  ASSESSMENT/PLAN:   Urticarial rash First occurred on arm where she got covid booster two days after administration and spread to other arm and chest. very pruritic. No reaction to first two doses of vaccine. No other known changes to cause. Topical anti-itch cream helped symptoms but did not resolve rash. - prescribed topical steroid. - checking liver/kidney labs for possible contribution. AST/ALT mildly elevated at last  check. - repeat lipid panel as well  Essential hypertension Elevated to 162/78 today  Atherosclerosis of native arteries of extremity with intermittent claudication (HCC) Continue statin. Recheck lipids - attempt to restart pletal at lower dose. Prescribed 50mg  daily. - continue multiple daily exercises - continue to follow up with Dr. Carlis Abbott - patient is intolerant of ASA, consider plavix - requested patient bring in a handicap parking placard application from [place], [place]

## 2019-12-04 LAB — LIPID PANEL
Chol/HDL Ratio: 4.1 ratio (ref 0.0–4.4)
Cholesterol, Total: 179 mg/dL (ref 100–199)
HDL: 44 mg/dL (ref >39)
LDL Chol Calc (NIH): 104 mg/dL — ABNORMAL HIGH (ref 0–99)
Triglycerides: 179 mg/dL — ABNORMAL HIGH (ref 0–149)
VLDL Cholesterol Cal: 31 mg/dL (ref 5–40)

## 2019-12-04 LAB — COMPREHENSIVE METABOLIC PANEL
ALT: 44 IU/L — ABNORMAL HIGH (ref 0–32)
AST: 46 IU/L — ABNORMAL HIGH (ref 0–40)
Albumin/Globulin Ratio: 1.5 (ref 1.2–2.2)
Albumin: 4.7 g/dL (ref 3.7–4.7)
Alkaline Phosphatase: 102 IU/L (ref 44–121)
BUN/Creatinine Ratio: 14 (ref 12–28)
BUN: 11 mg/dL (ref 8–27)
Bilirubin Total: 0.6 mg/dL (ref 0.0–1.2)
CO2: 24 mmol/L (ref 20–29)
Calcium: 10 mg/dL (ref 8.7–10.3)
Chloride: 102 mmol/L (ref 96–106)
Creatinine, Ser: 0.78 mg/dL (ref 0.57–1.00)
GFR calc Af Amer: 84 mL/min/{1.73_m2} (ref >59)
GFR calc non Af Amer: 73 mL/min/{1.73_m2} (ref >59)
Globulin, Total: 3.1 g/dL (ref 1.5–4.5)
Glucose: 89 mg/dL (ref 65–99)
Potassium: 4.2 mmol/L (ref 3.5–5.2)
Sodium: 140 mmol/L (ref 134–144)
Total Protein: 7.8 g/dL (ref 6.0–8.5)

## 2019-12-04 LAB — TSH: TSH: 1.69 u[IU]/mL (ref 0.450–4.500)

## 2019-12-04 LAB — HEPATITIS C ANTIBODY: Hep C Virus Ab: <0.1 s/co ratio (ref 0.0–0.9)

## 2019-12-05 ENCOUNTER — Other Ambulatory Visit: Payer: Self-pay | Admitting: Student in an Organized Health Care Education/Training Program

## 2019-12-05 DIAGNOSIS — R748 Abnormal levels of other serum enzymes: Secondary | ICD-10-CM

## 2019-12-05 NOTE — Progress Notes (Signed)
Ordering hepatitis panel and liver US to evaluate for elevated LFTs. Positive for liver cyst/hemangioma in remote history on CT. Suspicious of NAFLD

## 2019-12-22 ENCOUNTER — Telehealth: Payer: Self-pay | Admitting: *Deleted

## 2019-12-22 DIAGNOSIS — J4550 Severe persistent asthma, uncomplicated: Secondary | ICD-10-CM

## 2019-12-22 DIAGNOSIS — D7219 Other eosinophilia: Secondary | ICD-10-CM | POA: Diagnosis not present

## 2019-12-22 DIAGNOSIS — K219 Gastro-esophageal reflux disease without esophagitis: Secondary | ICD-10-CM | POA: Diagnosis not present

## 2019-12-22 DIAGNOSIS — J3089 Other allergic rhinitis: Secondary | ICD-10-CM | POA: Diagnosis not present

## 2019-12-22 NOTE — Telephone Encounter (Signed)
Tried to reach patient in regards to [facility] and Me re-enrollment for [facility] patient assist but no VM set up so unable to leave message.  Patient should have received letter from them too

## 2019-12-23 ENCOUNTER — Ambulatory Visit: Payer: Self-pay

## 2019-12-23 ENCOUNTER — Ambulatory Visit: Payer: Medicare Other | Admitting: Allergy and Immunology

## 2019-12-23 ENCOUNTER — Other Ambulatory Visit: Payer: Self-pay

## 2019-12-23 VITALS — BP 138/72 | HR 76 | Temp 98.2°F | Resp 14 | Ht <= 58 in | Wt 132.2 lb

## 2019-12-23 DIAGNOSIS — D7219 Other eosinophilia: Secondary | ICD-10-CM

## 2019-12-23 DIAGNOSIS — J3089 Other allergic rhinitis: Secondary | ICD-10-CM | POA: Diagnosis not present

## 2019-12-23 DIAGNOSIS — J4550 Severe persistent asthma, uncomplicated: Secondary | ICD-10-CM

## 2019-12-23 DIAGNOSIS — K219 Gastro-esophageal reflux disease without esophagitis: Secondary | ICD-10-CM

## 2019-12-23 MED ORDER — FAMOTIDINE 40 MG PO TABS
40.0000 mg | ORAL_TABLET | Freq: Every day | ORAL | 5 refills | Status: DC
Start: 2019-12-23 — End: 2020-03-24
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
## 2019-12-23 NOTE — Patient Instructions (Addendum)
  1. Continue Benralizumab injections    2. Start treatment for reflux:   A. Famotidine 40 mg - 1 tablet 1 time per day  2. If needed:   A. albuterol nebulization or ProAir HFA 2 puffs every 4-6 hours  B. OTC cetirizine 10 mg tablet 1 time per day  3. Return to clinic in 6 months or earlier if problem

## 2019-12-24 ENCOUNTER — Encounter: Payer: Self-pay | Admitting: Allergy and Immunology

## 2019-12-30 ENCOUNTER — Ambulatory Visit: Payer: Medicare Other | Admitting: Allergy and Immunology

## 2020-01-20 ENCOUNTER — Ambulatory Visit: Payer: Medicare Other | Admitting: Vascular Surgery

## 2020-01-20 ENCOUNTER — Encounter (HOSPITAL_COMMUNITY): Payer: Medicare Other

## 2020-02-17 ENCOUNTER — Other Ambulatory Visit: Payer: Self-pay

## 2020-02-17 ENCOUNTER — Ambulatory Visit: Payer: Self-pay

## 2020-02-17 ENCOUNTER — Ambulatory Visit (INDEPENDENT_AMBULATORY_CARE_PROVIDER_SITE_OTHER): Payer: Self-pay

## 2020-02-17 DIAGNOSIS — J4550 Severe persistent asthma, uncomplicated: Secondary | ICD-10-CM

## 2020-02-17 DIAGNOSIS — I70213 Atherosclerosis of native arteries of extremities with intermittent claudication, bilateral legs: Secondary | ICD-10-CM

## 2020-03-02 ENCOUNTER — Encounter: Payer: Self-pay | Admitting: Vascular Surgery

## 2020-03-02 ENCOUNTER — Other Ambulatory Visit: Payer: Self-pay

## 2020-03-02 ENCOUNTER — Ambulatory Visit (HOSPITAL_COMMUNITY)
Admission: RE | Admit: 2020-03-02 | Discharge: 2020-03-02 | Disposition: A | Discharge status: Home / Self Care | Payer: Medicare Other | Source: Ambulatory Visit | Attending: Vascular Surgery | Admitting: Vascular Surgery

## 2020-03-02 ENCOUNTER — Ambulatory Visit: Payer: Medicare Other | Admitting: Vascular Surgery

## 2020-03-02 VITALS — BP 169/80 | HR 88 | Temp 98.1°F | Resp 14 | Ht 59.5 in | Wt 130.0 lb

## 2020-03-02 DIAGNOSIS — I70213 Atherosclerosis of native arteries of extremities with intermittent claudication, bilateral legs: Secondary | ICD-10-CM | POA: Insufficient documentation

## 2020-03-02 MED ORDER — CILOSTAZOL 50 MG PO TABS: 50.0000 mg | ORAL_TABLET | Freq: Every day | ORAL | 3 refills | Status: DC
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
## 2020-03-03 ENCOUNTER — Other Ambulatory Visit: Payer: Self-pay

## 2020-03-03 DIAGNOSIS — I70213 Atherosclerosis of native arteries of extremities with intermittent claudication, bilateral legs: Secondary | ICD-10-CM

## 2020-03-24 ENCOUNTER — Encounter: Payer: Self-pay | Admitting: Student in an Organized Health Care Education/Training Program

## 2020-03-24 ENCOUNTER — Other Ambulatory Visit: Payer: Self-pay

## 2020-03-24 ENCOUNTER — Ambulatory Visit (INDEPENDENT_AMBULATORY_CARE_PROVIDER_SITE_OTHER): Payer: Medicare Other | Admitting: Student in an Organized Health Care Education/Training Program

## 2020-03-24 DIAGNOSIS — I10 Essential (primary) hypertension: Secondary | ICD-10-CM | POA: Diagnosis not present

## 2020-03-24 DIAGNOSIS — K219 Gastro-esophageal reflux disease without esophagitis: Secondary | ICD-10-CM

## 2020-03-24 DIAGNOSIS — R7401 Elevation of levels of liver transaminase levels: Secondary | ICD-10-CM | POA: Diagnosis not present

## 2020-03-24 DIAGNOSIS — R1032 Left lower quadrant pain: Secondary | ICD-10-CM | POA: Diagnosis not present

## 2020-03-24 DIAGNOSIS — K760 Fatty (change of) liver, not elsewhere classified: Secondary | ICD-10-CM | POA: Insufficient documentation

## 2020-03-24 DIAGNOSIS — M1991 Primary osteoarthritis, unspecified site: Secondary | ICD-10-CM

## 2020-03-24 MED ORDER — AMLODIPINE BESYLATE 2.5 MG PO TABS
2.5000 mg | ORAL_TABLET | Freq: Every day | ORAL | 0 refills | Status: DC
Start: 2020-03-24 — End: 2020-09-29

## 2020-03-24 MED ORDER — PANTOPRAZOLE SODIUM 40 MG PO TBEC: 40.0000 mg | DELAYED_RELEASE_TABLET | Freq: Every day | ORAL | 1 refills | Status: DC

## 2020-03-24 MED ORDER — AMLODIPINE BESYLATE 2.5 MG PO TABS: 2.5000 mg | ORAL_TABLET | Freq: Every day | ORAL | 3 refills | Status: DC

## 2020-03-24 NOTE — Assessment & Plan Note (Signed)
Patient has history of osteoarthritis in hands, neck. Exam today positive for likely hip arthritis as well. Recommended patient use Tylenol as needed as well as topical Voltaren gel. Suggested patient can use ibuprofen infrequently if this seems to help her pain

## 2020-03-24 NOTE — Progress Notes (Signed)
SUBJECTIVE:   CHIEF COMPLAINT / HPI: GERD, abdominal pain f/u, PAD f/u  GERD-patient endorses continued chronic acid reflux symptoms which are daily despite use of famotidine daily. She does not eat spicy foods or lay down after eating. Denies vomiting.  Abdominal pain-patient continues to have intermittent left lower quadrant pain. The pain does not seem to be associated with bowel movements. She has regular bowel movements every day. Has not been taking her fiber regularly. Eats a generally healthy diet since her husband has prediabetes. Per her chart, GI physician saw her 1/21 and diagnosed her with diverticulosis and related her left lower quadrant pain to spasm. Patient's transaminases were mildly improved 11/21 at last recheck. Patient never followed up with right upper quadrant ultrasound to evaluate the elevated enzymes and history of cyst. There seems to have been some communication error with scheduling that appointment. Patient endorses that she no longer has her gallbladder.  HTN-patient does not take any medication for high blood pressure and has not in the past. Patient and her husband endorse that she has elevated blood pressures frequently but no symptoms related to this.  PAD-patient continues to follow with vascular surgery for this. She seems to be tolerating the lower dose of Pletal well and thinks that it is making an improvement in her symptoms. She walks for at least 30 minutes/day with her husband who pushes her to exercise more. She also uses an exercise bike at home. She describes having pain with walking but is able to tolerate it and keep pushing through it. She had worsened pain when climbing three flights of stairs at her daughter's house.  OBJECTIVE:   BP (!) 145/75   Pulse 72   Ht 5' (1.524 m)   Wt 131 lb 12.8 oz (59.8 kg)   SpO2 97%   BMI 25.74 kg/m   Repeat by provider: 150/80  General: NAD, pleasant, able to participate in exam Cardiac: RRR, normal heart  sounds, no murmurs. 2+ radial and PT pulses bilaterally Respiratory: CTAB, normal effort, No wheezes, rales or rhonchi Abdomen: soft, nondistended, no hepatic or splenomegaly, +BS. Mild tenderness in epigastric region without guarding Extremities: no edema. WWP. Negative straight leg raise bilaterally. Positive groin pain with Corky Sox and FADIR on left side but not on right. Skin: warm and dry, no rashes noted Neuro: alert and oriented, no focal deficits Psych: Normal affect and mood  ASSESSMENT/PLAN:   GERD Patient had speech evaluation 3/21 which showed some mild dysphagia. The speech pathologist taught her some techniques for improving swallowing and recommended neurology follow-up. Patient had improvement in her symptoms so did not follow-up with neurology. She continues to have reflux symptoms despite daily famotidine and avoiding high risk diet. -Discontinue famotidine -Prescribed Protonix daily -Follow-up 3 to 4 weeks  Essential hypertension Blood pressure remains elevated at least two clinical visits and on repeat by provider today with resting in the room for at least 10 minutes. Patient is asymptomatic and open to starting a blood pressure medication at this time. Recheck of blood pressure was 150/80 -Prescribed amlodipine 2.5 to take daily at bedtime -Follow-up in 3 to 4 weeks  Left lower quadrant pain Patient's abdominal exam was benign and does not have any red flag GI symptoms. She is to follow-up with GI for diverticulosis and does not have signs or symptoms of infection at this time. Performed hip exam and identified pain in left hip with Corky Sox which means that likely hip osteoarthritis as at least partially responsible for  this pain that she is feeling. She also describes the pain is worse with exertion so could be contributed by her PAD. -Recommended she take daily fiber supplement in addition to high-fiber diet  Transaminitis Mild and chronic Patient did not follow-up  with right upper quadrant ultrasound due to miscommunication on scheduling at our last appointment. [name] schedule their appointment for them during this office visit. Patient and her husband are aware of the appointment and location and have agreed to go. -Right upper quadrant ultrasound 2/25. Patient is s/p cholecystectomy. Follow-up on prior H/o of hepatic cyst and likely NAFLD  Osteoarthritis Patient has history of osteoarthritis in hands, neck. Exam today positive for likely hip arthritis as well. Recommended patient use Tylenol as needed as well as topical Voltaren gel. Suggested patient can use ibuprofen infrequently if this seems to help her pain    Anne Warren, [place]

## 2020-03-24 NOTE — Assessment & Plan Note (Signed)
Mild and chronic Patient did not follow-up with right upper quadrant ultrasound due to miscommunication on scheduling at our last appointment. [name] schedule their appointment for them during this office visit. Patient and her husband are aware of the appointment and location and have agreed to go. -Right upper quadrant ultrasound 2/25. Patient is s/p cholecystectomy. Follow-up on prior H/o of hepatic cyst and likely NAFLD

## 2020-03-24 NOTE — Patient Instructions (Addendum)
It was a pleasure to see you today!  To summarize our discussion for this visit:  You have an ultrasound of your right upper abdomen on Friday at 0900. You can't eat beforehand  I am giving you a map.  Stop famotidine medication. Instead I'm prescribing protonix for your reflux. Please set up an appointment to follow up with GI for diverticulitis.  For your arthritis- you can try tylenol, or ibuprofen a couple of times as well as voltaren gel to help with joint pain.  Some additional health maintenance measures we should update are: Health Maintenance Due  Topic Date Due  . INFLUENZA VACCINE  08/31/2019  .    Please return to our clinic to see me in 1 month or less so we can discuss your ultrasound findings and how you are doing with the new medication.  Call the clinic at [phone] if your symptoms worsen or you have any concerns.   Thank you for allowing me to take part in your care,  Dr. Doristine Mango

## 2020-03-24 NOTE — Assessment & Plan Note (Addendum)
Patient's abdominal exam was benign and does not have any red flag GI symptoms. She is to follow-up with GI for diverticulosis and does not have signs or symptoms of infection at this time. Performed hip exam and identified pain in left hip with Corky Sox which means that likely hip osteoarthritis as at least partially responsible for this pain that she is feeling. She also describes the pain is worse with exertion so could be contributed by her PAD. -Recommended she take daily fiber supplement in addition to high-fiber diet

## 2020-03-24 NOTE — Assessment & Plan Note (Signed)
Patient had speech evaluation 3/21 which showed some mild dysphagia. The speech pathologist taught her some techniques for improving swallowing and recommended neurology follow-up. Patient had improvement in her symptoms so did not follow-up with neurology. She continues to have reflux symptoms despite daily famotidine and avoiding high risk diet. -Discontinue famotidine -Prescribed Protonix daily -Follow-up 3 to 4 weeks

## 2020-03-24 NOTE — Assessment & Plan Note (Signed)
Blood pressure remains elevated at least two clinical visits and on repeat by provider today with resting in the room for at least 10 minutes. Patient is asymptomatic and open to starting a blood pressure medication at this time. Recheck of blood pressure was 150/80 -Prescribed amlodipine 2.5 to take daily at bedtime -Follow-up in 3 to 4 weeks

## 2020-03-26 ENCOUNTER — Ambulatory Visit
Admission: RE | Admit: 2020-03-26 | Discharge: 2020-03-26 | Disposition: A | Discharge status: Home / Self Care | Payer: Medicare Other | Source: Ambulatory Visit | Attending: Family Medicine | Admitting: Family Medicine

## 2020-03-26 DIAGNOSIS — K7689 Other specified diseases of liver: Secondary | ICD-10-CM | POA: Diagnosis not present

## 2020-03-26 DIAGNOSIS — R748 Abnormal levels of other serum enzymes: Secondary | ICD-10-CM

## 2020-04-13 ENCOUNTER — Ambulatory Visit (INDEPENDENT_AMBULATORY_CARE_PROVIDER_SITE_OTHER): Payer: Medicare Other | Admitting: *Deleted

## 2020-04-13 ENCOUNTER — Other Ambulatory Visit: Payer: Self-pay

## 2020-04-13 DIAGNOSIS — J4550 Severe persistent asthma, uncomplicated: Secondary | ICD-10-CM | POA: Diagnosis not present

## 2020-04-20 ENCOUNTER — Ambulatory Visit: Payer: Medicare Other | Admitting: Student in an Organized Health Care Education/Training Program

## 2020-06-08 ENCOUNTER — Ambulatory Visit (INDEPENDENT_AMBULATORY_CARE_PROVIDER_SITE_OTHER): Payer: Medicare Other | Admitting: *Deleted

## 2020-06-08 ENCOUNTER — Other Ambulatory Visit: Payer: Self-pay

## 2020-06-08 DIAGNOSIS — J4550 Severe persistent asthma, uncomplicated: Secondary | ICD-10-CM

## 2020-06-17 ENCOUNTER — Other Ambulatory Visit: Payer: Self-pay

## 2020-06-17 ENCOUNTER — Ambulatory Visit (INDEPENDENT_AMBULATORY_CARE_PROVIDER_SITE_OTHER): Payer: Medicare Other

## 2020-06-17 DIAGNOSIS — Z23 Encounter for immunization: Secondary | ICD-10-CM | POA: Diagnosis not present

## 2020-06-22 ENCOUNTER — Telehealth: Payer: Self-pay

## 2020-06-22 ENCOUNTER — Ambulatory Visit: Payer: Medicare Other | Admitting: Allergy and Immunology

## 2020-06-22 ENCOUNTER — Other Ambulatory Visit: Payer: Self-pay

## 2020-06-22 ENCOUNTER — Encounter: Payer: Self-pay | Admitting: Allergy and Immunology

## 2020-06-22 VITALS — BP 150/80 | HR 97 | Temp 98.0°F | Resp 16 | Ht 60.0 in | Wt 131.0 lb

## 2020-06-22 DIAGNOSIS — J4550 Severe persistent asthma, uncomplicated: Secondary | ICD-10-CM

## 2020-06-22 DIAGNOSIS — J3089 Other allergic rhinitis: Secondary | ICD-10-CM | POA: Diagnosis not present

## 2020-06-22 DIAGNOSIS — R1310 Dysphagia, unspecified: Secondary | ICD-10-CM | POA: Diagnosis not present

## 2020-06-22 DIAGNOSIS — D7219 Other eosinophilia: Secondary | ICD-10-CM

## 2020-06-22 DIAGNOSIS — K219 Gastro-esophageal reflux disease without esophagitis: Secondary | ICD-10-CM

## 2020-06-22 NOTE — Telephone Encounter (Signed)
I called to schedule the appointment with [facility] & they will contact the patient to get her scheduled after reviewing her chart.  Will work on the GI referral next.   Thanks

## 2020-06-22 NOTE — Telephone Encounter (Signed)
Patient has been seen with [facility] before according the scheduler. Patient is now scheduled to see Nicoletta Ba, PA on 07/28/2020 @ 3:30. Patient requested  @ the time of checkout that we mail her the appointment information. I have placed this appointment in the mail for her.   I will keep an eye out for the appointment with [facility] via epic.   Thanks

## 2020-06-22 NOTE — Telephone Encounter (Signed)
Please send referral for gastroenterologist and neurologist for swallowing issues.

## 2020-06-22 NOTE — Telephone Encounter (Signed)
      Please send referral for gastroenterologist and neurologist for swallowing issues.

## 2020-06-22 NOTE — Patient Instructions (Addendum)
  1. Continue Benralizumab injections    2. If needed:   A. albuterol nebulization or ProAir HFA 2 puffs every 4-6 hours  B. OTC cetirizine 10 mg tablet 1 time per day  3.  For swallowing issue need to perform the following:   A. Continue Protonix / pantoprazole 40 mg - 1 tablet 1 time per day  B. Visit with gastroenterologist   C. Visit with neurologist  4. Return to clinic in 6 months or earlier if problem
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
## 2020-06-23 ENCOUNTER — Ambulatory Visit (INDEPENDENT_AMBULATORY_CARE_PROVIDER_SITE_OTHER): Payer: Medicare Other | Admitting: Family Medicine

## 2020-06-23 ENCOUNTER — Encounter: Payer: Self-pay | Admitting: Family Medicine

## 2020-06-23 ENCOUNTER — Encounter: Payer: Self-pay | Admitting: Allergy and Immunology

## 2020-06-23 ENCOUNTER — Other Ambulatory Visit: Payer: Self-pay

## 2020-06-23 VITALS — BP 130/60 | HR 84 | Ht 60.0 in | Wt 128.2 lb

## 2020-06-23 DIAGNOSIS — R7401 Elevation of levels of liver transaminase levels: Secondary | ICD-10-CM

## 2020-06-23 DIAGNOSIS — R1031 Right lower quadrant pain: Secondary | ICD-10-CM

## 2020-06-23 NOTE — Patient Instructions (Addendum)
Thank you for coming in to see [place] today! Please see below to review our plan for today's visit:  1. Please call Dr. Henrene Pastor (your Gastroenterologist) and see him for follow up. Continue your protonix as you have been taking it.  Benjaman Kindler, MD [place] 3rd Floor [phone] 2. If you have worse abdominal pain, fevers, nausea, vomiting, diarrhea, or are pooping blood/dark stools please go to the emergency department.  3. Go get your CT abdomen/pelvis when scheduled.   __________________________________________________________  1. Llame al Dr. Henrene Pastor (su gastroenterlogo) y vistelo para un seguimiento. Contine su protonix como lo ha [employer]. Dr. Benjaman Kindler [place] 3.er piso [phone] 2. Si tiene [place] abdominal, fiebre, nuseas, vmitos, diarrea o est defecando con sangre/heces oscuras, vaya al departamento de emergencias. 3. Vaya a hacerse la tomografa computarizada del abdomen/pelvis cuando est programado. - Friday, June 10th at 1:45pm    Please call the clinic at [phone] if your symptoms worsen or you have any concerns. It was our pleasure to serve you!   Dr. Milus Banister [facility]

## 2020-06-23 NOTE — Assessment & Plan Note (Signed)
Patient experiencing 1 week of intermittent right lower quadrant pain, denies any blood in her stools.  Denies nausea, vomiting.  Concern for gassiness/constipation however patient does not seem constipated on physical exam and had normal BM yesterday.  Patient does feel like her abdomen is bigger/more distended, no previous history of ascites.  Patient does have history of hepatic steatosis and cholecystectomy.  Patient is overall quite well-appearing and not in any acute distress on exam today. -We will repeat CT abdomen/pelvis for patient -Recommend she follows up with her gastroenterologist -Strict return precautions provided

## 2020-06-23 NOTE — Progress Notes (Signed)
    SUBJECTIVE:   CHIEF COMPLAINT / HPI:   Right lower quadrant pain: she had pain in her RLQ that started 1-2 weeks ago, the pain is not continuous. She felt like it was a pain inside her abdomen. Pain was 7-8/10. Her last BM was last night, her pain felt the same after the BM. The pain does not change with eating. She drinks some tea to help soothe her pain. She does not have blood in her stool. She has no nausea or vomiting. She has had the pain before in her left side and was told it was diverticulitis.  They applied a cold compress to her abdomen and said that reduced her pain.   Patient has previously been diagnosed with Hepatic steatosis.  On her previous labs on CMP she has had elevated transaminases.  Other hepatitis labs were ordered however were not collected.  Patient's symptoms were discussed with the preceptor who recommended repeating CT abdomen to rule out ascites.   PERTINENT  PMH / PSH:  Patient Active Problem List   Diagnosis Date Noted  . Right lower quadrant abdominal pain 06/23/2020  . Left lower quadrant pain 03/24/2020  . Transaminitis 03/24/2020  . Urticarial rash 12/03/2019  . Patient is Jehovah's Witness 06/23/2019  . Asthma, intermittent 06/23/2019  . 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 Oropharyngeal dysphagia 04/10/2019  . Atherosclerosis of native arteries of extremity with intermittent claudication ([place]) 11/19/2018  . Axillary fullness 10/02/2018  . Dizziness 03/19/2015  . ALLERGIC RHINITIS, SEASONAL 11/09/2008  . URINARY INCONTINENCE, STRESS 06/01/2008  . GERD 11/14/2007  . Osteoarthritis 12/18/2006  . TENSION HEADACHES, CHRONIC 11/27/2006  . DECREASED HEARING 11/27/2006  . Essential hypertension 11/27/2006    OBJECTIVE:   BP 130/60   Pulse 84   Ht 5' (1.524 m)   Wt 128 lb 4 oz (58.2 kg)   SpO2 97%   BMI 25.05 kg/m    Physical exam: General: Well-appearing, nontoxic-appearing Respiratory: Comfortable  work of breathing on room air Cardio: RRR, S1-S2 present Abdomen:  Normal bowel sounds, soft to palpation, no masses appreciated Mildly distended abdomen with very small fluid wave Tenderness to palpation of right lower quadrant, negative rebound tenderness, negative Rovsing   ASSESSMENT/PLAN:   Right lower quadrant abdominal pain Patient experiencing 1 week of intermittent right lower quadrant pain, denies any blood in her stools.  Denies nausea, vomiting.  Concern for gassiness/constipation however patient does not seem constipated on physical exam and had normal BM yesterday.  Patient does feel like her abdomen is bigger/more distended, no previous history of ascites.  Patient does have history of hepatic steatosis and cholecystectomy.  Patient is overall quite well-appearing and not in any acute distress on exam today. -We will repeat CT abdomen/pelvis for patient -Recommend she follows up with her gastroenterologist -Strict return precautions provided -Repeat CMP, also do hepatitis labs which were previously ordered   Daisy Floro, [place]

## 2020-06-24 LAB — COMPREHENSIVE METABOLIC PANEL
ALT: 27 IU/L (ref 0–32)
AST: 29 IU/L (ref 0–40)
Albumin/Globulin Ratio: 1.6 (ref 1.2–2.2)
Albumin: 4.7 g/dL (ref 3.7–4.7)
Alkaline Phosphatase: 90 IU/L (ref 44–121)
BUN/Creatinine Ratio: 15 (ref 12–28)
BUN: 13 mg/dL (ref 8–27)
Bilirubin Total: 0.5 mg/dL (ref 0.0–1.2)
CO2: 25 mmol/L (ref 20–29)
Calcium: 10.2 mg/dL (ref 8.7–10.3)
Chloride: 103 mmol/L (ref 96–106)
Creatinine, Ser: 0.86 mg/dL (ref 0.57–1.00)
Globulin, Total: 2.9 g/dL (ref 1.5–4.5)
Glucose: 109 mg/dL — ABNORMAL HIGH (ref 65–99)
Potassium: 4.5 mmol/L (ref 3.5–5.2)
Sodium: 141 mmol/L (ref 134–144)
Total Protein: 7.6 g/dL (ref 6.0–8.5)
eGFR: 68 mL/min/{1.73_m2} (ref >59)

## 2020-07-09 ENCOUNTER — Other Ambulatory Visit: Payer: Medicare Other

## 2020-07-14 ENCOUNTER — Other Ambulatory Visit: Payer: Self-pay | Admitting: *Deleted

## 2020-07-14 DIAGNOSIS — I70213 Atherosclerosis of native arteries of extremities with intermittent claudication, bilateral legs: Secondary | ICD-10-CM

## 2020-07-20 ENCOUNTER — Other Ambulatory Visit: Payer: Self-pay | Admitting: *Deleted

## 2020-07-20 MED ORDER — FASENRA 30 MG/ML ~~LOC~~ SOSY: 30.0000 mg | PREFILLED_SYRINGE | SUBCUTANEOUS | 6 refills | Status: DC

## 2020-07-26 ENCOUNTER — Ambulatory Visit
Admission: RE | Admit: 2020-07-26 | Discharge: 2020-07-26 | Disposition: A | Discharge status: Home / Self Care | Payer: Medicare Other | Source: Ambulatory Visit | Attending: Family Medicine | Admitting: Family Medicine

## 2020-07-26 DIAGNOSIS — N200 Calculus of kidney: Secondary | ICD-10-CM | POA: Diagnosis not present

## 2020-07-26 DIAGNOSIS — K5730 Diverticulosis of large intestine without perforation or abscess without bleeding: Secondary | ICD-10-CM | POA: Diagnosis not present

## 2020-07-26 DIAGNOSIS — R109 Unspecified abdominal pain: Secondary | ICD-10-CM | POA: Diagnosis not present

## 2020-07-26 DIAGNOSIS — R1031 Right lower quadrant pain: Secondary | ICD-10-CM

## 2020-07-26 DIAGNOSIS — R160 Hepatomegaly, not elsewhere classified: Secondary | ICD-10-CM | POA: Diagnosis not present

## 2020-07-28 ENCOUNTER — Encounter: Payer: Self-pay | Admitting: Physician Assistant

## 2020-07-28 ENCOUNTER — Ambulatory Visit: Payer: Medicare Other | Admitting: Physician Assistant

## 2020-07-28 ENCOUNTER — Other Ambulatory Visit (INDEPENDENT_AMBULATORY_CARE_PROVIDER_SITE_OTHER): Payer: Medicare Other

## 2020-07-28 VITALS — BP 130/72 | HR 100 | Ht 60.0 in | Wt 127.0 lb

## 2020-07-28 DIAGNOSIS — R1310 Dysphagia, unspecified: Secondary | ICD-10-CM

## 2020-07-28 DIAGNOSIS — R1030 Lower abdominal pain, unspecified: Secondary | ICD-10-CM | POA: Diagnosis not present

## 2020-07-28 DIAGNOSIS — K5900 Constipation, unspecified: Secondary | ICD-10-CM

## 2020-07-28 DIAGNOSIS — R143 Flatulence: Secondary | ICD-10-CM

## 2020-07-28 DIAGNOSIS — K219 Gastro-esophageal reflux disease without esophagitis: Secondary | ICD-10-CM

## 2020-07-28 LAB — CBC WITH DIFFERENTIAL/PLATELET
Basophils Absolute: 0 10*3/uL (ref 0.0–0.1)
Basophils Relative: 0.5 % (ref 0.0–3.0)
Eosinophils Absolute: 0 10*3/uL (ref 0.0–0.7)
Eosinophils Relative: 0.1 % (ref 0.0–5.0)
HCT: 41.3 % (ref 36.0–46.0)
Hemoglobin: 14 g/dL (ref 12.0–15.0)
Lymphocytes Relative: 23.6 % (ref 12.0–46.0)
Lymphs Abs: 1.6 10*3/uL (ref 0.7–4.0)
MCHC: 33.8 g/dL (ref 30.0–36.0)
MCV: 88.3 fl (ref 78.0–100.0)
Monocytes Absolute: 0.6 10*3/uL (ref 0.1–1.0)
Monocytes Relative: 9.1 % (ref 3.0–12.0)
Neutro Abs: 4.4 10*3/uL (ref 1.4–7.7)
Neutrophils Relative %: 66.7 % (ref 43.0–77.0)
Platelets: 209 10*3/uL (ref 150.0–400.0)
RBC: 4.68 Mil/uL (ref 3.87–5.11)
RDW: 13.8 % (ref 11.5–15.5)
WBC: 6.7 10*3/uL (ref 4.0–10.5)

## 2020-07-28 NOTE — Progress Notes (Signed)
Noted  

## 2020-07-28 NOTE — Progress Notes (Signed)
Subjective:    Patient ID: Anne Warren, female    DOB: 08-13-1940, 80 y.o.   MRN: [id number]  HPI Anne Warren is a pleasant Hispanic non-English-speaking 80 year old female, established with Dr. Marina Warren.  She comes in today with primary complaint of dysphagia.  She was last seen here in January 2021 by Dr. Marina Warren at that time with complaints of recurrent left lower quadrant pain consistent with diverticular spasm, abdominal bloating GERD and multiple non-GI complaints. She last had colonoscopy in October 2020 with 4 polyps removed all 1 to 4 mm in size, noted to have pandiverticulosis.  Path on the polyps consistent with tubular adenomas and the sigmoid polyp was an inflammatory polyp. We have not done prior EGD here but she and her husband relate that she had EGD done at [facility] a few years back apparently for complaints of dysphagia.  They do not seem to think she had any definite diagnosis and do not recall whether she had esophageal dilation. Patient does have chronic GERD and is maintained on Protonix 40 mg every morning and famotidine 40 mg every afternoon.  She does feel that these are helpful.  She will occasionally have heartburn breakthrough depending on what she eats. It sounds as if she has been having difficulty with dysphagia over the past 4 to 5 years but has had progression which brought her back today.  She says she has difficulty with liquids at times and consistently with solid food.  She says she has to cut her food up into very small pieces chew carefully and drink liquids between her bites to help the food traversed.  She is not having any episodes of sensation of food hanging in her esophagus for any prolonged periods she says sometimes it is uncomfortable traversing the esophagus.  She is not having any regurgitation but sometimes refluxes food back up.  Her appetite has been good, her weight has been stable.  She is also complaining of a separate left upper quadrant pain tucked up  under her ribs which she says occurs when she bends over and has been noticing some bilateral lower quadrant pains over the past 3 months which she describes as intermittent and very sharp.  Over the past several days she has also been noticing some burning with urination.  It sounds as if she is having chronic problems with constipation gas and bloating.  She has been eating prunes on a daily basis but has not tried any other regimen. Other medical issues include asthma, hypertension, mild memory loss, osteoarthritis, and peripheral vascular disease with claudication for which she is on Pletal.  Review of Systems.Pertinent positive and negative review of systems were noted in the above HPI section.  All other review of systems was otherwise negative.   Outpatient Encounter Medications as of 07/28/2020  Medication Sig   amLODipine (NORVASC) 2.5 MG tablet Take 1 tablet (2.5 mg total) by mouth at bedtime.   Ascorbic Acid (VITAMIN C) 1000 MG tablet Take 1,000 mg by mouth daily.   Benralizumab (FASENRA) 30 MG/ML SOSY Inject 1 mL (30 mg total) into the skin every 8 (eight) weeks.   cilostazol (PLETAL) 50 MG tablet Take 1 tablet (50 mg total) by mouth daily.   famotidine (PEPCID) 40 MG tablet Take 40 mg by mouth daily.   Multiple Vitamins-Minerals (MULTIVITAMIN WOMEN PO) Take 1 tablet by mouth daily.   pantoprazole (PROTONIX) 40 MG tablet Take 1 tablet (40 mg total) by mouth daily.   rosuvastatin (CRESTOR) 5  MG tablet Take 1 tablet (5 mg total) by mouth daily. (Patient not taking: Reported on 07/28/2020)   Facility-Administered Encounter Medications as of 07/28/2020  Medication   Benralizumab SOSY 30 mg   Allergies  Allergen Reactions   Aspirin Hives    Reports gastric ulcers with [id number].    Atorvastatin Other (See Comments)    REACTION: Losing memory   Simvastatin Other (See Comments)    REACTION: Losing memory   Amitriptyline Hcl Other (See Comments)    REACTION: Felt bad when on this and HCTZ    Fish Oil Nausea And Vomiting and Other (See Comments)    REACTION: and dizziness   Patient Active Problem List   Diagnosis Date Noted   Right lower quadrant abdominal pain 06/23/2020   Left lower quadrant pain 03/24/2020   Transaminitis 03/24/2020   Urticarial rash 12/03/2019   Patient is Jehovah's Witness 06/23/2019   Asthma, intermittent 06/23/2019   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   Oropharyngeal dysphagia 04/10/2019   Atherosclerosis of native arteries of extremity with intermittent claudication ([facility]) 11/19/2018   Axillary fullness 10/02/2018   Dizziness 03/19/2015   ALLERGIC RHINITIS, SEASONAL 11/09/2008   URINARY INCONTINENCE, STRESS 06/01/2008   GERD 11/14/2007   Osteoarthritis 12/18/2006   TENSION HEADACHES, CHRONIC 11/27/2006   DECREASED HEARING 11/27/2006   Essential hypertension 11/27/2006   Social History   Socioeconomic History   Marital status: Married    Spouse name: Anne Warren   Number of children: 1   Years of education: 8   Highest education level: Not on file  Occupational History   Occupation: Retired- Retail banker: UNEMPLOYED  Tobacco Use   Smoking status: Former    Packs/day: 0.10    Pack years: 0.00    Types: Cigarettes    Quit date: 01/30/1978    Years since quitting: 42.5   Smokeless tobacco: Never   Tobacco comments:    1 cig daily <1 year 12/11/14  Vaping Use   Vaping Use: Never used  Substance and Sexual Activity   Alcohol use: Never    Alcohol/week: 0.0 standard drinks   Drug use: No   Sexual activity: Yes  Other Topics Concern   Not on file  Social History Narrative   Native of [place]   Married to Anne Warren, native of [place], visits children of his first marriage there.   They have a daughter, who is a Clinical research associate in [place] area   Religion, [facility] Witness      Health Care POA: Primary: sister Anne Warren [phone] Secondary: Anne Warren [phone]   Emergency Contact:  Husband Anne Warren [phone]   End of Life Plan: DNR     Who lives with you: Lives in 1 story home with husband.   Any pets: 0   Diet: Patient has a varied diet of protein, starch, and vegetables.  Patient tries not to eat red meat.    Exercise: Patient walks 2-3 times per week.  Pt has had problems with her asthma while exercising.    Seatbelts: Patient reports wearing seat belts when in vehicle.    Wynelle Link Exposure/Protection: Patient reports wearing sun protection.   Hobbies: Likes to read   Continues to drive and does not use any assistance devices. Is independent of all ADL's   Education: 9th grade, is able to read spanish and about 50% [place]             Social Determinants of Dispensing optician  Resource Strain: Not on file  Food Insecurity: Not on file  Transportation Needs: Not on file  Physical Activity: Not on file  Stress: Not on file  Social Connections: Not on file  Intimate Partner Violence: Not on file    Ms. Moure's family history includes Breast cancer in her sister; Cancer in her sister and sister; Depression in her sister; Heart attack in her father and mother; Heart disease in her mother; Stroke in her brother and father.      Objective:    Vitals:   07/28/20 1517  BP: 130/72  Pulse: 100    Physical Exam.Well-developed well-nourished  i elderly Hispanic female in no acute distress.  Patient is accompanied by an interpreter and her husband  Weight, 127 pound BMI 24.8  HEENT; nontraumatic normocephalic, EOMI, PE R LA, sclera anicteric. Oropharynx; not examined today Neck; supple, no JVD Cardiovascular; regular rate and rhythm with S1-S2, no murmur rub or gallop Pulmonary; Clear bilaterally Abdomen; soft, she is tender along the left costal margin over the left lower anterior ribs, no palpable mass or hepatosplenomegaly, bowel sounds are active, there is also mild tenderness bilateral lower quadrants no guarding or rebound Rectal; not done today Skin;  benign exam, no jaundice rash or appreciable lesions Extremities; no clubbing cyanosis or edema skin warm and dry Neuro/Psych; alert and oriented x4, grossly nonfocal mood and affect appropriate        Assessment & Plan:   #23 80 year old non-English-speaking Hispanic female with what sounds like chronic dysphagia with recent progression.  She has had issues with liquids and solids though more so with solids. Apparently had prior EGD through [facility] results not known Suspect she may have chronic motility issues question presbyesophagus but will need to rule out esophageal stricture with history of chronic GERD  #2 chronic GERD stable on every morning Protonix and every afternoon PPI #3 constipation #4 gas and bloating #5 left costal margin pain which is musculoskeletal #6 Bilateral lower abdominal pain x3 months intermittent and sharp etiology not clear-recent CT without IV contrast unremarkable  #7 dysuria #8 history of pandiverticulosis 9.  Tubular adenomatous colon polyps  Plan; start low gas diet, not using any regular artificial sweeteners. Trial of Gas-X 1-2 with meals Consider breath testing for SIBO Continue Protonix 40 mg every morning and famotidine 40 mg every afternoon Patient will sign a release and will obtain her records from [facility] regarding prior EGD Schedule for barium swallow with a tablet Will also schedule for pelvic ultrasound given bilateral lower quadrant pain CBC with differential and UA with reflex culture We will plan office follow-up with myself in about 1 month.    Asra Gambrel S Joeleen Wortley PA-C 07/28/2020   Cc: Anderson, Chelsey L, DO

## 2020-07-28 NOTE — Patient Instructions (Addendum)
Si tiene 65 aos o ms, su ndice de [employer] corporal debe estar entre 23 y 74. Su ndice de masa corporal es de 24,8 kg/m. Si esto est fuera del rango mencionado anteriormente, considere hacer un seguimiento con su proveedor de Midwife. _________________________________________________________  [employer] de [facility] desean alentarlo a que use MYCHART para comunicarse con los proveedores para solicitudes o preguntas que no sean urgentes. Debido a los Astronomer de espera en el telfono, enviar un mensaje a su proveedor por [employer] puede ser una forma ms rpida y eficiente de obtener una respuesta. Espere 48 horas hbiles para obtener [employer]. Recuerde que esto es para solicitudes no urgentes.  Ha sido programado para un esofograma de bario en [facility] ([facility]) el 08/05/2020 a las 9:30 am. Llegue 15 minutos antes de su cita para registrarse. Asegrese de no comer ni beber nada 3 horas antes de su prueba. Si necesita reprogramar por algn motivo, comunquese con radiologa al [phone] para hacerlo. __________________________________________________________________ Rosette Reveal de bario es un examen que se concentra en vistas del esfago. Esto tiende a ser un examen de doble contraste (bario y Sudley lquidos que, cuando se combinan, crean un gas para distender la pared del esfago) o contraste simple (a base de yodo no inico). El estudio generalmente se adapta a sus sntomas, por lo que es esencial contar con un buen historial. Se presta atencin durante el estudio a la forma, Engineer, agricultural y configuracin del esfago, buscando alteraciones funcionales (como aspiracin, disfagia, Soil scientist, motilidad y reflujo) EXAMEN Es posible que le pidan que se ponga una bata, segn el tipo de deglucin que se realice. Un radilogo y un tcnico radilogo realizarn el procedimiento. El Contractor sobre la tipo de contraste seleccionado para su procedimiento  y dirigirlo Social worker. Se le pedir que se ponga de pie, se siente o se acueste en varias posiciones diferentes y que sostenga una pequea cantidad de lquido en la boca antes de que trague mientras se realiza la imagen. En algunos casos, se le pedir que trague malvaviscos recubiertos de bario para evaluar la motilidad de un bolo alimenticio slido. El examen se puede grabar como un procedimiento de fluoroscopia digital o de video. PROCEDIMIENTO POSTERIOR Tomar de 1 a 2 das para que el bario pase a travs de [employer]. Para facilitar esto, es importante, a menos que se indique lo contrario, aumentar los [employer] las prximas 24 a 28 horas y Optician, dispensing su dieta normal. Esta prueba suele tardar unos 30 minutos en realizarse. __________________________________________________________________________________  Clois Comber le ha programado una ecografa plvica en [facility] ([facility]) el 08/05/2020 a las 10:30 am. Llegue 15 minutos antes de su cita para registrarse. Asegrese de no beber nada 6 horas antes de su cita, deber beber agua antes de la ecografa para tener la vejiga llena. Si necesita reprogramar su cita, comunquese con radiologa al [phone]. Esta prueba suele tardar unos 30 minutos en realizarse.  Su proveedor le ha pedido que vaya al nivel del stano para el trabajo de laboratorio antes de salir hoy. Presione "B" en el ascensor. El laboratorio est ubicado en la primera puerta a la izquierda al salir del Materials engineer.  START Gas-x 1 comprimido antes de las comidas.  COMIENCE Miralax 1 tapn en 8 onzas de agua o jugo diariamente.  Siga una dieta baja en gases  Se le program un seguimiento con Amy Esterwood, PA-C el [address] [place] de 2022 a  las 3:30 p. m.

## 2020-07-29 LAB — URINALYSIS, ROUTINE W REFLEX MICROSCOPIC
Bilirubin Urine: NEGATIVE
Ketones, ur: NEGATIVE
Leukocytes,Ua: NEGATIVE
Nitrite: NEGATIVE
Specific Gravity, Urine: 1.025 (ref 1.000–1.030)
Total Protein, Urine: NEGATIVE
Urine Glucose: NEGATIVE
Urobilinogen, UA: 0.2 (ref 0.0–1.0)
pH: 6 (ref 5.0–8.0)

## 2020-07-29 NOTE — Progress Notes (Unsigned)
Attempted to call patient via interpreter, to give lab results. No answer and No voice mail. Patient does not use My Chart. Will try later.

## 2020-08-03 ENCOUNTER — Ambulatory Visit: Payer: Self-pay

## 2020-08-03 ENCOUNTER — Ambulatory Visit: Payer: Medicare Other | Admitting: Vascular Surgery

## 2020-08-03 ENCOUNTER — Encounter: Payer: Self-pay | Admitting: Vascular Surgery

## 2020-08-03 ENCOUNTER — Ambulatory Visit (HOSPITAL_COMMUNITY)
Admission: RE | Admit: 2020-08-03 | Discharge: 2020-08-03 | Disposition: A | Discharge status: Home / Self Care | Payer: Medicare Other | Source: Ambulatory Visit | Attending: Vascular Surgery | Admitting: Vascular Surgery

## 2020-08-03 ENCOUNTER — Other Ambulatory Visit: Payer: Self-pay

## 2020-08-03 VITALS — BP 162/77 | HR 69 | Temp 97.9°F | Resp 14 | Ht 59.0 in | Wt 126.0 lb

## 2020-08-03 DIAGNOSIS — I70213 Atherosclerosis of native arteries of extremities with intermittent claudication, bilateral legs: Secondary | ICD-10-CM
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
## 2020-08-05 ENCOUNTER — Ambulatory Visit (HOSPITAL_COMMUNITY)
Admission: RE | Admit: 2020-08-05 | Discharge: 2020-08-05 | Disposition: A | Discharge status: Home / Self Care | Payer: Medicare Other | Source: Ambulatory Visit | Attending: Physician Assistant | Admitting: Physician Assistant

## 2020-08-05 ENCOUNTER — Other Ambulatory Visit: Payer: Self-pay

## 2020-08-05 DIAGNOSIS — R1030 Lower abdominal pain, unspecified: Secondary | ICD-10-CM

## 2020-08-05 DIAGNOSIS — R1310 Dysphagia, unspecified: Secondary | ICD-10-CM

## 2020-08-05 DIAGNOSIS — I70213 Atherosclerosis of native arteries of extremities with intermittent claudication, bilateral legs: Secondary | ICD-10-CM

## 2020-08-05 DIAGNOSIS — R143 Flatulence: Secondary | ICD-10-CM | POA: Insufficient documentation

## 2020-08-05 DIAGNOSIS — Z780 Asymptomatic menopausal state: Secondary | ICD-10-CM | POA: Diagnosis not present

## 2020-08-05 DIAGNOSIS — K219 Gastro-esophageal reflux disease without esophagitis: Secondary | ICD-10-CM | POA: Insufficient documentation

## 2020-08-05 DIAGNOSIS — R140 Abdominal distension (gaseous): Secondary | ICD-10-CM | POA: Diagnosis not present

## 2020-08-05 DIAGNOSIS — K5900 Constipation, unspecified: Secondary | ICD-10-CM

## 2020-08-25 ENCOUNTER — Encounter: Payer: Self-pay | Admitting: Physician Assistant

## 2020-08-25 ENCOUNTER — Ambulatory Visit: Payer: Medicare Other | Admitting: Physician Assistant

## 2020-08-25 VITALS — BP 140/70 | HR 72 | Ht 58.5 in | Wt 124.5 lb

## 2020-08-25 DIAGNOSIS — K5790 Diverticulosis of intestine, part unspecified, without perforation or abscess without bleeding: Secondary | ICD-10-CM

## 2020-08-25 DIAGNOSIS — Z86010 Personal history of colonic polyps: Secondary | ICD-10-CM

## 2020-08-25 DIAGNOSIS — R1310 Dysphagia, unspecified: Secondary | ICD-10-CM | POA: Diagnosis not present

## 2020-08-25 DIAGNOSIS — K5900 Constipation, unspecified: Secondary | ICD-10-CM | POA: Diagnosis not present

## 2020-08-25 NOTE — Patient Instructions (Signed)
Si tiene 65 aos o ms, su ndice de [employer] corporal debe estar entre 23 y 39. Su ndice de masa corporal es de 25,58 kg/m. Si esto est fuera del rango mencionado anteriormente, considere hacer un seguimiento con su proveedor de Midwife. __________________________________________________________  [employer] de [facility] desean alentarlo a que use MYCHART para comunicarse con los proveedores para solicitudes o preguntas que no sean urgentes. Debido a los Astronomer de espera en el telfono, enviar un mensaje a su proveedor por [employer] puede ser una forma ms rpida y eficiente de obtener una respuesta. Espere 48 horas hbiles para obtener [employer]. Recuerde que esto es para solicitudes no urgentes.  Inicio Benefiber 2 cucharaditas diarias en un vaso de agua para los intestinos  Continuar con Pantoprazol 40 mg 1 comprimido al [employer] en trozos pequeos, mastquelos completamente y beba lquidos entre bocado y bocado.  Haga un seguimiento segn sea necesario.  Gracias por confiarme su atencin y [place].  Amy Esterwood, PA-C  Plan de alimentacin blanda Soft-Food Eating Plan Un plan de alimentacin blanda incluye alimentos seguros y fciles de Engineer, manufacturing systems y tragar. El mdico o el nutricionista pueden ayudarlo a Musician y sabores que se adapten a Catering manager. Siga este plan hasta que el mdico o el nutricionista le digan que es seguro comenzar a incluir otros alimentos ytexturas de alimentos. Cules son algunos consejos para seguir este plan? Al cocinar Cocine las carnes [employer] estn tiernas, pero no secas. Use mtodos como estofar, guisar o cocer en lquido. Cocine las verduras y las frutas hasta que estn los suficientemente blandas como para pisarlas con un tenedor. Pele las frutas blandas y frescas [employer] duraznos, los pelones y los [place]. Cuando prepare sopa, asegrese de que los trozos de carnes y verduras sean de  menos de media pulgada (1.30 cm). Recaliente despacio los alimentos sobrantes para que no se les forme una costra dura. Informacin general  Coma bocados pequeos o corte los alimentos en bocados de alrededor de media pulgada (1.30 cm) o menos. Es ms fcil Engineer, manufacturing systems y Programmer, applications en bocados. Consuma alimentos hmedos. Evite los alimentos [employer]. Evite alimentos que: Son difciles para tragar, como los alimentos secos, crocantes, pegajosos o que contienen trozos [place]. Son difciles para Engineer, manufacturing systems, como los alimentos duros, correosos o fibrosos. Contienen frutos secos, semillas o muchos tipos de fruta sin cocinar. Siga las instrucciones del nutricionista acerca de los tipos de lquidos que puede tragar sin riesgos. Es posible que le permitan tomar: Lquidos espesos solamente. Esto incluye solo lquidos que son ms espesos que la miel. Lquidos fluidos y espesos. Esto incluye todas las bebidas y los alimentos que se convierten en lquidos a Engineer, water. Para preparar lquidos espesos: Compre un polvo espesante de lquidos de venta comercial. Estos estn disponibles en tiendas de comestibles y farmacias. Mezcle el polvo espesante con el lquido de acuerdo con las indicaciones de Curator. Compre lquidos espesos ya preparados. Para espesar la sopa, preprela tipo pur, culela para extraer los trozos y [employer], copos de papas o almidn de maz. Agregue un espesante comercial a los alimentos que se convierten en lquidos a temperatura ambiente, como los batidos, el yogur, el helado, la gelatina y el sorbete. Pregntele al mdico si necesita tomar un suplemento de fibras.  Qu alimentos debo comer? Frutas Todas las frutas enlatadas y cocidas. Frutas frescas blandas y peladas. Hughie Closs. [place] verduras blandas cocidas. Valeda Malm cortada en  juliana. Cereales Panes, magdalenas, panqueques o waffles humedecidos con sirope, jalea o mantequilla. Cereales secos  bien humedecidos con [employer]. Cereales cocidos,hmedos. Pastas y Lorre Nick bien cocidos. Carnes y otras protenas Carne molida de res, ave o pescado, tierna y [place]. Carne de res cocida en sujugo o en salsas. Huevos. Lcteos Leche. Crema. Yogur. Requesn. Queso blando sin la cscara. Dulces y [employer]. Batidos con [employer]. Sorbete. Pudn. Grasas y [employer]. Margarina. Aceite de oliva, canola, girasol y semillas de [place]. Aderezo de textura homognea para ensaladas. Queso crema de textura homognea.[place]. Salsas. Es posible que los productos que se enumeran ms [place] no constituyan una lista completa de los alimentos y las bebidas que puede tomar. Consulte a unnutricionista para obtener ms informacin. Qu alimentos debo evitar? Lambert Mody Frutas frescas con piel o semillas, o ambas, como manzanas, peras o uvas. Lambert Mody fibrosas y con mucha pulpa, como la papaya, la pia, el coco y el mango.Frutas deshidratadas y todas las frutas pasas. Verduras Todas las verduras crudas. Maz cocido. Vegetales cocidos fibrosos o duros.Papas duras, fritas, crocantes o con piel. Cereales Cereales gruesos o secos, como salvado, granola y trigo triturado. Panes duros o gomosos con corteza como pan francs o baguettes. Panes con semillas, frutossecos o frutas. Carnes y otras protenas [place] duras y secas. Carne seca de res, ave o pescado. Carnes con cartlago. Pescado con espinas. Carne frita de res o pescado. Fiambre de cerdo y perros calientes. Frutos secos y semillas. [place] de man u otrasmantequillas de frutos secos que tengan trozos. Lcteos Yogur con nueces y [place]. Dulces y postres Bizcochuelos o galletitas muy secos o gomosos. Postres con frutas pasas, frutossecos o coco. Masas y pasteles fritos. Masas y pasteles muy pesados. Grasas y aceites Queso crema con frutas o frutos secos. Aderezos con semillas o trozos paraensaladas. Es posible que los productos que se enumeran ms [place] no  constituyan una lista completa de los alimentos y las bebidas que Nurse, adult. Consulte a unnutricionista para obtener ms informacin. Resumen Un plan de alimentacin blanda incluye alimentos seguros y fciles de tragar. En general, los alimentos deben ser lo suficientemente blandos como para pisarlos con un tenedor. Evite los alimentos secos, difciles de masticar, crocantes, pegajosos, fibrosos o crujientes. Pregntele al mdico si necesita espesar los lquidos o tomar un suplemento de [place]. Esta informacin no tiene Marine scientist el consejo del mdico. Asegresede hacerle al mdico cualquier pregunta que tenga. Document Revised: 01/12/2020 Document Reviewed: 01/12/2020 Elsevier Patient Education  2022 [employer].

## 2020-08-25 NOTE — Progress Notes (Addendum)
Subjective:    Patient ID: Anne Warren, female    DOB: 06-Oct-1940, 80 y.o.   MRN: [id number]  HPI Anne Warren is an 80 year old Hispanic female, established with Dr. Marina Goodell and also known to myself who comes in today for follow-up after recent imaging. She was seen on 07/28/2020 with complaints of persistent dysphagia which she says has been present over the past couple of years.  She says she has some difficulty with both liquids and solids, and has to cut her food into very small pieces and eat and chew very slowly.  She did have 1 prior EGD through [facility].  We had requested those records but did not receive after the last office visit.  She has since undergone a barium swallow on 08/05/2020 that showed a normal-appearing esophagus with no evidence of narrowing or stricture and no evidence of dysmotility. She has also had complaints of lower abdominal pain.  At the time of last office visit she was complaining of bilateral lower abdominal pain over the prior 3 months which had been intermittent.  Today she is stating that she is having right lower quadrant pain again mild and intermittent.  She says that since she had the CT scan and barium study she has been mildly constipated.  She does have MiraLAX at home but has not been using regularly. She had pelvic ultrasound on 08/05/2020 which showed nonvisualization of the ovaries and was otherwise negative and CT of the abdomen and pelvis had been done with contrast on 07/26/2020 showing some evidence of hepatomegaly and diffuse diverticulosis. GERD symptoms are stable on Protonix 40 mg every morning and famotidine q. p.m.  Last colonoscopy October 2020 with 4 small polyps removed all 1 to 4 mm in size, noted to have diffuse diverticulosis.  Path on the polyps consistent with 2 tubular adenomas 1 inflammatory polyp and one benign mucosa.  Patient and husband are asking for dietary advice for the dysphagia.  We discussed possibility of proceeding with repeat EGD  which she is not interested in at this time and also referral to speech pathology for modified swallowing study but she does not want to pursue that at this time either.  Review of Systems Pertinent positive and negative review of systems were noted in the above HPI section.  All other review of systems was otherwise negative.   Outpatient Encounter Medications as of 08/25/2020  Medication Sig   amLODipine (NORVASC) 2.5 MG tablet Take 1 tablet (2.5 mg total) by mouth at bedtime.   Ascorbic Acid (VITAMIN C) 1000 MG tablet Take 1,000 mg by mouth daily.   Benralizumab (FASENRA) 30 MG/ML SOSY Inject 1 mL (30 mg total) into the skin every 8 (eight) weeks.   cilostazol (PLETAL) 50 MG tablet Take 1 tablet (50 mg total) by mouth daily.   famotidine (PEPCID) 40 MG tablet Take 40 mg by mouth daily.   Multiple Vitamins-Minerals (MULTIVITAMIN WOMEN PO) Take 1 tablet by mouth daily.   pantoprazole (PROTONIX) 40 MG tablet Take 1 tablet (40 mg total) by mouth daily. (Patient not taking: Reported on 08/25/2020)   rosuvastatin (CRESTOR) 5 MG tablet Take 1 tablet (5 mg total) by mouth daily. (Patient not taking: Reported on 08/25/2020)   Facility-Administered Encounter Medications as of 08/25/2020  Medication   Benralizumab SOSY 30 mg   Allergies  Allergen Reactions   Aspirin Hives    Reports gastric ulcers with [id number].    Lipitor [Atorvastatin] Other (See Comments)    REACTION: Losing memory  Zocor [Simvastatin] Other (See Comments)    REACTION: Losing memory   Amitriptyline Hcl Other (See Comments)    REACTION: Felt bad when on this and HCTZ   Fish Oil Nausea And Vomiting and Other (See Comments)    REACTION: and dizziness   Patient Active Problem List   Diagnosis Date Noted   Right lower quadrant abdominal pain 06/23/2020   Left lower quadrant pain 03/24/2020   Transaminitis 03/24/2020   Urticarial rash 12/03/2019   Patient is Jehovah's Witness 06/23/2019   Asthma, intermittent 06/23/2019    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   Oropharyngeal dysphagia 04/10/2019   Atherosclerosis of native arteries of extremity with intermittent claudication (HCC) 11/19/2018   Axillary fullness 10/02/2018   Dizziness 03/19/2015   ALLERGIC RHINITIS, SEASONAL 11/09/2008   URINARY INCONTINENCE, STRESS 06/01/2008   GERD 11/14/2007   Osteoarthritis 12/18/2006   TENSION HEADACHES, CHRONIC 11/27/2006   DECREASED HEARING 11/27/2006   Essential hypertension 11/27/2006   Social History   Socioeconomic History   Marital status: Married    Spouse name: Chief Technology Officer   Number of children: 1   Years of education: 8   Highest education level: Not on file  Occupational History   Occupation: Retired- Retail banker: UNEMPLOYED  Tobacco Use   Smoking status: Former    Packs/day: 0.10    Types: Cigarettes    Quit date: 01/30/1978    Years since quitting: 42.5   Smokeless tobacco: Never   Tobacco comments:    1 cig daily <1 year 12/11/14  Vaping Use   Vaping Use: Never used  Substance and Sexual Activity   Alcohol use: Never    Alcohol/week: 0.0 standard drinks   Drug use: No   Sexual activity: Yes  Other Topics Concern   Not on file  Social History Narrative   Native of [place]   Married to Lorenzo, native of [place], visits children of his first marriage there.   They have a daughter, who is a Clinical research associate in [place] area   Religion, [facility] Witness      Health Care POA: Primary: sister Elwin Mocha [phone] Secondary: Milderd Meager [phone]   Emergency Contact: Husband [name] [phone]   End of Life Plan: DNR     Who lives with you: Lives in 1 story home with husband.   Any pets: 0   Diet: Patient has a varied diet of protein, starch, and vegetables.  Patient tries not to eat red meat.    Exercise: Patient walks 2-3 times per week.  Pt has had problems with her asthma while exercising.    Seatbelts: Patient reports wearing seat belts  when in vehicle.    Wynelle Link Exposure/Protection: Patient reports wearing sun protection.   Hobbies: Likes to read   Continues to drive and does not use any assistance devices. Is independent of all ADL's   Education: 9th grade, is able to read spanish and about 50% [place]             Social Determinants of Health   Financial Resource Strain: Not on file  Food Insecurity: Not on file  Transportation Needs: Not on file  Physical Activity: Not on file  Stress: Not on file  Social Connections: Not on file  Intimate Partner Violence: Not on file    Ms. Kerby's family history includes Breast cancer in her sister; Cancer in her sister and sister; Depression in her sister; Heart attack in her father and mother;  Heart disease in her mother; Stroke in her brother and father.      Objective:    Vitals:   08/25/20 1515  BP: 140/70  Pulse: 72    Physical Exam Well-developed well-nourished elderly Hispanic female no acute distress.  Accompanied by her husband and an interpreter Weight 124 BMI 25.5  HEENT; nontraumatic normocephalic, EOMI, PE R LA, sclera anicteric.  Neuro/Psych; alert and oriented x4, grossly nonfocal mood and affect appropriate        Assessment & Plan:   #63 80 year old Hispanic female with complaints of chronic dysphagia. Recent barium swallow completely normal, consider globus, functional dysphagia.  Patient has been reassured that there is no evidence of esophageal mass, stricture or dysmotility on barium swallow.  We discussed EGD and speech path swallowing eval, however she does not wish to pursue either of these at present  #2 GERD stable #3 diffuse diverticulosis #4 constipation #5 intermittent mild abdominal pain-pelvic ultrasound and CT of the abdomen and pelvis both unrevealing as to etiology.  I suspect this is secondary to IBS type symptoms. #6 hepatomegaly-hepatic steatosis #7 history of adenomatous polyps-no recall due to age, up-to-date/last  colonoscopy October 2020  Plan; patient instructed to cut her food into small pieces, chew carefully and sip fluids between bites. She was provided with a soft diet Add Benefiber 2 teaspoons daily in a glass of water for constipation/diverticulosis. Continue MiraLAX as needed Continue Protonix 40 mg p.o. every morning and famotidine 40 mg every afternoon Follow-up with Dr. Marina Goodell or myself as needed.  Addendum-received records from [facility]/colonoscopy 09/07/2008 Dr. Randa Evens few medium sized diverticuli in the sigmoid colon small internal hemorrhoids, no polyps, otherwise negative exam.  Noted restricted mobility of the colon  Rawlins Stuard S Cerise Lieber PA-C 08/25/2020   Cc: Erick Alley, DO

## 2020-08-26 ENCOUNTER — Ambulatory Visit (INDEPENDENT_AMBULATORY_CARE_PROVIDER_SITE_OTHER): Payer: Medicare Other | Admitting: *Deleted

## 2020-08-26 ENCOUNTER — Other Ambulatory Visit: Payer: Self-pay

## 2020-08-26 DIAGNOSIS — J4550 Severe persistent asthma, uncomplicated: Secondary | ICD-10-CM | POA: Diagnosis not present

## 2020-08-26 NOTE — Progress Notes (Signed)
Assessment and plan noted ?

## 2020-08-30 NOTE — Telephone Encounter (Signed)
error 

## 2020-09-08 ENCOUNTER — Other Ambulatory Visit: Payer: Self-pay | Admitting: Student in an Organized Health Care Education/Training Program

## 2020-09-09 ENCOUNTER — Other Ambulatory Visit: Payer: Self-pay | Admitting: *Deleted

## 2020-09-29 ENCOUNTER — Other Ambulatory Visit: Payer: Self-pay

## 2020-09-29 ENCOUNTER — Encounter: Payer: Self-pay | Admitting: Student

## 2020-09-29 ENCOUNTER — Ambulatory Visit (INDEPENDENT_AMBULATORY_CARE_PROVIDER_SITE_OTHER): Payer: Medicare Other | Admitting: Student

## 2020-09-29 VITALS — BP 148/74 | HR 69 | Wt 126.0 lb

## 2020-09-29 DIAGNOSIS — R1310 Dysphagia, unspecified: Secondary | ICD-10-CM | POA: Diagnosis not present

## 2020-09-29 MED ORDER — ROSUVASTATIN CALCIUM 5 MG PO TABS: 5.0000 mg | ORAL_TABLET | Freq: Every day | ORAL | 1 refills | Status: DC

## 2020-09-29 MED ORDER — FAMOTIDINE 40 MG PO TABS: 40.0000 mg | ORAL_TABLET | Freq: Every day | ORAL | 1 refills | Status: DC

## 2020-09-29 MED ORDER — AMLODIPINE BESYLATE 2.5 MG PO TABS: 2.5000 mg | ORAL_TABLET | Freq: Every day | ORAL | 0 refills | Status: DC

## 2020-09-29 NOTE — Patient Instructions (Signed)
It was great to see you! Thank you for allowing me to participate in your care!  I recommend that you always bring your medications to each appointment as this makes it easy to ensure you are on the correct medications and helps [place] not miss when refills are needed.  Our plans for today:  -I have referred you to a dietitian.  They will call you to schedule an appointment -Today I refilled your medications   Take care and seek immediate care sooner if you develop any concerns.   Dr. Precious Gilding, DO [facility]

## 2020-09-29 NOTE — Progress Notes (Signed)
    SUBJECTIVE:   CHIEF COMPLAINT / HPI: Follow up and medication refill  PERTINENT  PMH / PSH: Hypertension, atherosclerosis, oropharyngeal dysphagia, Asthma, GERD, osteoarthritis, tension headaches  HTN BP today is 148/74 Pt has not taken her amlodipine today because she ran out of her medication.   Dysphagia Patient was seen by gastroenterology on 08/25/20 for dysphagia.  She had a barium swallow which was normal.  Note per gastroenterology states she did not want an EGD or speech path swallowing evaluation.  She states that she is having to chew food very thoroughly, does better with soft food, does better if she drinks water after each bite. She had requested a referral to a dietician.   Fatigue Feeling more fatigued than normal for the past 6 months. Pain in her legs when she walks due to peripheral vascular disease.  She thinks this is contributing for her fatigue because she is not able to get around like she uses to.  No change in apitite. Sleeping okay.   Admits to episodes of "lights flashing and black spots", feels like she may fall over/pass out when she stands up too fast or for a long time. Sitting down, resting and drinking water helps.   OBJECTIVE:   Vitals:   09/29/20 1333  BP: (!) 148/74  Pulse: 69  SpO2: 98%     General: NAD, pleasant, able to participate in exam Cardiac: RRR, no murmurs. Respiratory: CTAB, normal effort, No wheezes, rales or rhonchi Abdomen: Bowel sounds present, nondistended, mild tenderness to palpation of RUQ Extremities: no edema or cyanosis. Skin: warm and dry, no rashes noted Neuro: alert, no obvious focal deficits Psych: Normal affect and mood  ASSESSMENT/PLAN:   Dysphagia -referral to dietician - follow up with GI as needed   Fatigue -CBC from 6/29, TSH from 12/03/19 and CMP from 5/25 WNL. Agree with patient that fatigue may just be related to decreased ambulation due to her leg pain.   The episodes she describes sound  like she may have some orthostatic hypotension. She was advised to continue to stay hydrated, stand very slowly, always have something to grab onto when standing, and always sit for a moment first when going from lying to standing. If these episodes worsen, she should return for further evaluation.   Follow up in 3-4 months or sooner if needed.    Dr. Precious Gilding, [place]

## 2020-10-27 ENCOUNTER — Other Ambulatory Visit: Payer: Self-pay

## 2020-10-27 ENCOUNTER — Ambulatory Visit (INDEPENDENT_AMBULATORY_CARE_PROVIDER_SITE_OTHER): Payer: Medicare Other | Admitting: *Deleted

## 2020-10-27 DIAGNOSIS — J4550 Severe persistent asthma, uncomplicated: Secondary | ICD-10-CM | POA: Diagnosis not present

## 2020-11-10 ENCOUNTER — Other Ambulatory Visit: Payer: Self-pay

## 2020-11-10 ENCOUNTER — Ambulatory Visit (INDEPENDENT_AMBULATORY_CARE_PROVIDER_SITE_OTHER): Payer: Medicare Other

## 2020-11-10 DIAGNOSIS — Z23 Encounter for immunization: Secondary | ICD-10-CM | POA: Diagnosis not present

## 2020-12-02 ENCOUNTER — Ambulatory Visit (INDEPENDENT_AMBULATORY_CARE_PROVIDER_SITE_OTHER): Payer: Medicare Other | Admitting: Family Medicine

## 2020-12-02 ENCOUNTER — Other Ambulatory Visit: Payer: Self-pay

## 2020-12-02 VITALS — BP 148/76 | HR 83

## 2020-12-02 DIAGNOSIS — J4520 Mild intermittent asthma, uncomplicated: Secondary | ICD-10-CM

## 2020-12-02 DIAGNOSIS — J00 Acute nasopharyngitis [common cold]: Secondary | ICD-10-CM

## 2020-12-02 DIAGNOSIS — I10 Essential (primary) hypertension: Secondary | ICD-10-CM

## 2020-12-02 NOTE — Patient Instructions (Addendum)
Sus pulmones suenan claros para m y no creo que tenga una infeccin pulmonar bacteriana en este momento y no creo que necesite ningn antibitico.  Si comienza a tener fiebre o empeoramiento de los sntomas, regrese a Optometrist.  Puede continuar usando su inhalador de albuterol segn sea necesario para sibilancias o dificultad para respirar.  Tambin puede continuar usando Tylenol para ayudar con su dolor de [place].  Si tiene Public relations account executive, le recomiendo usar miel o pastillas para la garganta.

## 2020-12-02 NOTE — Progress Notes (Signed)
    SUBJECTIVE:   CHIEF COMPLAINT / HPI:   Part of visit was completed with Spanish interpreter [phone] but call was dropped and rest of visit was completed by patient and her husband.  Patient reports that she and her husband returned from staying about 10 days ago, they were on the plane for the travels and since then has felt a little bit sick.  She reports that she has had asthma for many years and she has not had any issues with it since she is started getting "shots for it".  Since returning, she has noted increased phlegm, headache, cough.  She has been taking Tylenol for the headache with improvement.  Notes that she was having a minimal amount of wheeze with her cough on Thursday and Friday and took her albuterol inhaler with improvement.  She has not had any wheezing since it is overall doing okay but still feels a little bit down.  She is not out of her any of her inhalers or medications.  PERTINENT  PMH / PSH: Reviewed, asthma.  OBJECTIVE:   Pulse 83   SpO2 99%   Gen: well-appearing, NAD CV: RRR, no m/r/g appreciated, no peripheral edema Pulm: CTAB, no wheezes/crackles GI: soft, non-tender, non-distended HEENT: mildly erythematous oropharynx, TM clear bilaterally, no conjunctival injection  ASSESSMENT/PLAN:   Common cold symptoms Patient with common cold symptoms and worsening of asthma that required albuterol administration.  Patient is currently stable and doing very well, do not feel that she needs antibiotics at this time and lung exam is reassuring.  Discussed with patient to continue symptomatic treatment with Tylenol and albuterol as needed.  Return precautions given.   Rise Patience, [place]

## 2020-12-28 ENCOUNTER — Ambulatory Visit: Payer: Medicare Other | Admitting: Allergy and Immunology

## 2020-12-28 ENCOUNTER — Encounter: Payer: Self-pay | Admitting: Allergy and Immunology

## 2020-12-28 ENCOUNTER — Ambulatory Visit: Payer: Medicare Other

## 2020-12-28 ENCOUNTER — Other Ambulatory Visit: Payer: Self-pay

## 2020-12-28 VITALS — BP 142/80 | HR 86 | Temp 98.2°F | Resp 16 | Ht 59.0 in | Wt 130.2 lb

## 2020-12-28 DIAGNOSIS — J4550 Severe persistent asthma, uncomplicated: Secondary | ICD-10-CM

## 2020-12-28 DIAGNOSIS — R1310 Dysphagia, unspecified: Secondary | ICD-10-CM | POA: Diagnosis not present

## 2020-12-28 DIAGNOSIS — J3089 Other allergic rhinitis: Secondary | ICD-10-CM

## 2020-12-28 DIAGNOSIS — K219 Gastro-esophageal reflux disease without esophagitis: Secondary | ICD-10-CM | POA: Diagnosis not present

## 2020-12-28 MED ORDER — CETIRIZINE HCL 10 MG PO TABS: 10.0000 mg | ORAL_TABLET | Freq: Two times a day (BID) | ORAL | 5 refills | Status: DC | PRN

## 2020-12-28 MED ORDER — ALBUTEROL SULFATE HFA 108 (90 BASE) MCG/ACT IN AERS: 2.0000 | INHALATION_SPRAY | RESPIRATORY_TRACT | 1 refills | Status: DC | PRN

## 2020-12-28 MED ORDER — ALBUTEROL SULFATE (2.5 MG/3ML) 0.083% IN NEBU: 2.5000 mg | INHALATION_SOLUTION | RESPIRATORY_TRACT | 1 refills | Status: DC | PRN

## 2020-12-28 NOTE — Patient Instructions (Addendum)
  1. Continue Benralizumab injections    2. If needed:   A.  albuterol nebulization or ProAir HFA 2 puffs every 4-6 hours  B.  OTC cetirizine 10 mg tablet 1 time per day  C.  Protonix/pantoprazole 40 mg - 1 tablet 1 time per day  3.  Obtain bi-valent COVID-vaccine  4. Return to clinic in 6 months or earlier if problem
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
## 2020-12-29 ENCOUNTER — Encounter: Payer: Self-pay | Admitting: Allergy and Immunology

## 2021-02-01 ENCOUNTER — Ambulatory Visit (HOSPITAL_COMMUNITY): Payer: Medicare Other | Attending: Vascular Surgery

## 2021-02-01 ENCOUNTER — Ambulatory Visit: Payer: Medicare Other | Admitting: Vascular Surgery

## 2021-02-02 ENCOUNTER — Telehealth: Payer: Self-pay

## 2021-02-02 NOTE — Telephone Encounter (Signed)
APP Tyra calls today to ask about getting PT ordered for a patient and would it be beneficial for her. Patient is still c/o claudication type pain. Advised that it was fine to order PT if they wanted, but that wasn't something [facility] would do for claudication - if patient was not experiencing rest pain - she should continue to walk to try and develop collaterals. Tyra states, "so what I understand is the patient just needs to push through the pain and there is no further help for her?" Explained that they could order PT - it was fine, but not something we would order. Tyra then said that she was calling from a wellness company from the patient's insurance. Noticed patient is due for 6 month follow up - placed patient on the schedule for evaluation next week.

## 2021-02-08 ENCOUNTER — Ambulatory Visit: Payer: Medicare Other | Admitting: Vascular Surgery

## 2021-02-08 ENCOUNTER — Other Ambulatory Visit: Payer: Self-pay

## 2021-02-08 ENCOUNTER — Encounter: Payer: Self-pay | Admitting: Vascular Surgery

## 2021-02-08 ENCOUNTER — Ambulatory Visit (HOSPITAL_COMMUNITY)
Admission: RE | Admit: 2021-02-08 | Discharge: 2021-02-08 | Disposition: A | Discharge status: Home / Self Care | Payer: Medicare Other | Source: Ambulatory Visit | Attending: Vascular Surgery | Admitting: Vascular Surgery

## 2021-02-08 ENCOUNTER — Other Ambulatory Visit: Payer: Self-pay | Admitting: *Deleted

## 2021-02-08 VITALS — BP 124/62 | HR 81 | Temp 98.2°F | Resp 16 | Ht 59.0 in | Wt 128.0 lb

## 2021-02-08 DIAGNOSIS — I70213 Atherosclerosis of native arteries of extremities with intermittent claudication, bilateral legs: Secondary | ICD-10-CM | POA: Insufficient documentation

## 2021-02-08 MED ORDER — FASENRA 30 MG/ML ~~LOC~~ SOSY: 30.0000 mg | PREFILLED_SYRINGE | SUBCUTANEOUS | 6 refills | Status: DC
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
## 2021-02-09 ENCOUNTER — Encounter: Payer: Self-pay | Admitting: Family Medicine

## 2021-02-11 ENCOUNTER — Other Ambulatory Visit: Payer: Self-pay | Admitting: *Deleted

## 2021-02-11 DIAGNOSIS — I70213 Atherosclerosis of native arteries of extremities with intermittent claudication, bilateral legs: Secondary | ICD-10-CM

## 2021-02-22 ENCOUNTER — Other Ambulatory Visit: Payer: Self-pay

## 2021-02-22 ENCOUNTER — Ambulatory Visit: Payer: Medicare Other

## 2021-02-22 DIAGNOSIS — J4550 Severe persistent asthma, uncomplicated: Secondary | ICD-10-CM

## 2021-03-01 ENCOUNTER — Other Ambulatory Visit: Payer: Self-pay

## 2021-03-01 ENCOUNTER — Encounter: Payer: Self-pay | Admitting: Student

## 2021-03-01 ENCOUNTER — Ambulatory Visit (INDEPENDENT_AMBULATORY_CARE_PROVIDER_SITE_OTHER): Payer: Medicare Other | Admitting: Student

## 2021-03-01 VITALS — BP 146/80 | HR 73 | Wt 130.6 lb

## 2021-03-01 DIAGNOSIS — I739 Peripheral vascular disease, unspecified: Secondary | ICD-10-CM | POA: Diagnosis not present

## 2021-03-01 DIAGNOSIS — R42 Dizziness and giddiness: Secondary | ICD-10-CM | POA: Diagnosis not present

## 2021-03-01 DIAGNOSIS — E7800 Pure hypercholesterolemia, unspecified: Secondary | ICD-10-CM

## 2021-03-01 MED ORDER — ROSUVASTATIN CALCIUM 20 MG PO TABS: 20.0000 mg | ORAL_TABLET | Freq: Every day | ORAL | 3 refills | Status: DC

## 2021-03-01 MED ORDER — CLOPIDOGREL BISULFATE 75 MG PO TABS: 75.0000 mg | ORAL_TABLET | Freq: Every day | ORAL | 0 refills | Status: DC

## 2021-03-01 NOTE — Progress Notes (Signed)
° ° °  SUBJECTIVE:   CHIEF COMPLAINT / HPI:   Dizziness Pt feels dizzy when stands up and sees black spots. This has worsened over past month. States she feels better after sitting down and symptoms resolve.   HTN Patient currently takes amlodipine 2.5 mg daily  Peripheral vascular disease of extremities with claudication Patient currently follows with vascular surgery and takes Pletal.  Pain due to claudication limits patient's ability to walk and has greatly impacted her quality of life.  The vascular surgeon has discussed with patient the option of a left leg arteriogram and intervention but do not recommend any interventions with the right leg due to limitations.  Patient is unsure if she would like to proceed with vascular surgery for her left leg.  Patient initially asked for referral to a different vascular surgeon for second opinion.  However, when told that she would have to drive to [facility] or [place] for the referral she decided against it as she likes to stay local.  HLD Patient is currently taking rosuvastatin 5 mg daily.  Last lipid panel from 12/03/2019 showed an LDL of 104.   PERTINENT  PMH / PSH: HTN, HLD, PVD  OBJECTIVE:   Vitals:   03/01/21 0913  BP: (!) 146/80  Pulse: 73  SpO2: 100%     General: NAD, pleasant, able to participate in exam Cardiac: RRR, no murmurs. Respiratory: CTAB, normal effort, No wheezes, rales or rhonchi Abdomen:  nontender, nondistended, soft Neuro: Cranial nerves II through XII intact, sensation intact, finger-nose-finger and alternating hand movement test normal Psych: Normal affect and mood  ASSESSMENT/PLAN:   Dizziness Orthostatic vitals were negative today although going from sitting to standing there was a drop in systolic BP of 15.  BP today was slightly elevated at 146/80.  Neuro exam today was reassuring.  Patient was advised to go from lying to sitting to standing positions very slowly, be sure she has something that  she can lean on at all times and to return if the symptoms worsen or persist.   HTN BP elevated today 146/80.  Patient currently takes amlodipine 2.5 mg daily.  Will not increase dosage today due to patient having dizzy spells upon standing.  We will continue to evaluate symptoms of dizziness and blood pressure at next visit.  Peripheral vascular disease of extremities with claudication -Continue to follow with vascular surgery -Continue Pletal prescribed by vascular surgery -Start clopidogrel 75 mg daily -Form filled out for handicap parking as patient is having a difficult time walking long distances  HLD -Lipid panel today -Rosuvastatin increased to 20 mg daily  -return in 4 months for follow up or as soon as 2 weeks if dizziness persists or worsens Dr. Precious Gilding, [place]

## 2021-03-01 NOTE — Assessment & Plan Note (Signed)
-  Continue to follow with vascular surgery -Continue Pletal prescribed by vascular surgery -Start clopidogrel 75 mg daily

## 2021-03-01 NOTE — Patient Instructions (Addendum)
¡  Estuvo muy bien verte! Gracias por permitirme participar en su cuidado!   Le recomiendo que siempre traiga sus medicamentos a cada cita, ya que esto facilita asegurarse de que est tomando los medicamentos correctos y nos ayuda a no perder cuando se Chief Financial Officer.  Hoy aument su medicacin con estatinas a 20 mg diarios. Tambin te recet un nuevo medicamento llamado clopidogrel.  Estamos revisando algunos laboratorios hoy, lo llamar si son anormales y Audiological scientist un mensaje de MyChart o Ardelia Mems carta si son normales. Si no se entera de sus laboratorios en las prximas 2 semanas, hganoslo saber.   Devulvalo en aproximadamente 4 meses o antes si es necesario  Cudese y busque atencin inmediata antes si tiene alguna inquietud.  Dr. Precious Gilding, DO [facility]

## 2021-03-01 NOTE — Assessment & Plan Note (Signed)
>>  ASSESSMENT AND PLAN FOR PERIPHERAL VASCULAR DISEASE OF EXTREMITY WITH CLAUDICATION (HCC) WRITTEN ON 03/01/2021 12:43 PM BY Erick Alley, DO  -Continue to follow with vascular surgery -Continue Pletal prescribed by vascular surgery -Start clopidogrel 75 mg daily

## 2021-03-01 NOTE — Addendum Note (Signed)
Addended by: Micheline Rough on: 03/01/2021 12:55 PM   Modules accepted: Level of Service

## 2021-03-02 ENCOUNTER — Telehealth: Payer: Self-pay | Admitting: Student

## 2021-03-02 LAB — COMPREHENSIVE METABOLIC PANEL
ALT: 45 IU/L — ABNORMAL HIGH (ref 0–32)
AST: 51 IU/L — ABNORMAL HIGH (ref 0–40)
Albumin/Globulin Ratio: 1.6 (ref 1.2–2.2)
Albumin: 4.6 g/dL (ref 3.6–4.6)
Alkaline Phosphatase: 99 IU/L (ref 44–121)
BUN/Creatinine Ratio: 15 (ref 12–28)
BUN: 11 mg/dL (ref 8–27)
Bilirubin Total: 0.5 mg/dL (ref 0.0–1.2)
CO2: 24 mmol/L (ref 20–29)
Calcium: 9.9 mg/dL (ref 8.7–10.3)
Chloride: 103 mmol/L (ref 96–106)
Creatinine, Ser: 0.75 mg/dL (ref 0.57–1.00)
Globulin, Total: 2.9 g/dL (ref 1.5–4.5)
Glucose: 88 mg/dL (ref 70–99)
Potassium: 4.5 mmol/L (ref 3.5–5.2)
Sodium: 141 mmol/L (ref 134–144)
Total Protein: 7.5 g/dL (ref 6.0–8.5)
eGFR: 80 mL/min/{1.73_m2} (ref >59)

## 2021-03-02 LAB — CBC
Hematocrit: 43.4 % (ref 34.0–46.6)
Hemoglobin: 14.4 g/dL (ref 11.1–15.9)
MCH: 29.1 pg (ref 26.6–33.0)
MCHC: 33.2 g/dL (ref 31.5–35.7)
MCV: 88 fL (ref 79–97)
Platelets: 215 10*3/uL (ref 150–450)
RBC: 4.94 x10E6/uL (ref 3.77–5.28)
RDW: 13 % (ref 11.7–15.4)
WBC: 5.6 10*3/uL (ref 3.4–10.8)

## 2021-03-02 LAB — LIPID PANEL
Chol/HDL Ratio: 5.1 ratio — ABNORMAL HIGH (ref 0.0–4.4)
Cholesterol, Total: 226 mg/dL — ABNORMAL HIGH (ref 100–199)
HDL: 44 mg/dL (ref >39)
LDL Chol Calc (NIH): 144 mg/dL — ABNORMAL HIGH (ref 0–99)
Triglycerides: 210 mg/dL — ABNORMAL HIGH (ref 0–149)
VLDL Cholesterol Cal: 38 mg/dL (ref 5–40)

## 2021-03-02 LAB — TSH: TSH: 1.59 u[IU]/mL (ref 0.450–4.500)

## 2021-03-02 NOTE — Telephone Encounter (Signed)
Attempted to call pt with recent lab results. Voice mailbox had not been set up. I will try again tomorrow.

## 2021-03-04 ENCOUNTER — Encounter: Payer: Self-pay | Admitting: Student

## 2021-03-04 NOTE — Progress Notes (Signed)
Letter with results and request for pt to make apt in about 2 months sent.

## 2021-03-24 DIAGNOSIS — H04123 Dry eye syndrome of bilateral lacrimal glands: Secondary | ICD-10-CM | POA: Diagnosis not present

## 2021-03-24 DIAGNOSIS — H02831 Dermatochalasis of right upper eyelid: Secondary | ICD-10-CM | POA: Diagnosis not present

## 2021-04-19 ENCOUNTER — Ambulatory Visit: Payer: Medicare Other

## 2021-04-29 DIAGNOSIS — H02831 Dermatochalasis of right upper eyelid: Secondary | ICD-10-CM | POA: Diagnosis not present

## 2021-04-29 DIAGNOSIS — H02132 Senile ectropion of right lower eyelid: Secondary | ICD-10-CM | POA: Diagnosis not present

## 2021-04-29 DIAGNOSIS — Z01818 Encounter for other preprocedural examination: Secondary | ICD-10-CM | POA: Diagnosis not present

## 2021-04-29 DIAGNOSIS — H02054 Trichiasis without entropian left upper eyelid: Secondary | ICD-10-CM | POA: Diagnosis not present

## 2021-04-29 DIAGNOSIS — D485 Neoplasm of uncertain behavior of skin: Secondary | ICD-10-CM | POA: Diagnosis not present

## 2021-05-02 ENCOUNTER — Encounter: Payer: Self-pay | Admitting: Student

## 2021-05-02 ENCOUNTER — Ambulatory Visit (INDEPENDENT_AMBULATORY_CARE_PROVIDER_SITE_OTHER): Payer: Medicare Other | Admitting: Student

## 2021-05-02 ENCOUNTER — Ambulatory Visit (INDEPENDENT_AMBULATORY_CARE_PROVIDER_SITE_OTHER): Payer: Medicare Other

## 2021-05-02 VITALS — BP 141/76 | HR 80 | Ht 59.0 in | Wt 129.5 lb

## 2021-05-02 DIAGNOSIS — J4550 Severe persistent asthma, uncomplicated: Secondary | ICD-10-CM | POA: Diagnosis not present

## 2021-05-02 DIAGNOSIS — R2689 Other abnormalities of gait and mobility: Secondary | ICD-10-CM

## 2021-05-02 DIAGNOSIS — E7800 Pure hypercholesterolemia, unspecified: Secondary | ICD-10-CM | POA: Diagnosis not present

## 2021-05-02 DIAGNOSIS — I739 Peripheral vascular disease, unspecified: Secondary | ICD-10-CM

## 2021-05-02 DIAGNOSIS — R42 Dizziness and giddiness: Secondary | ICD-10-CM | POA: Diagnosis not present

## 2021-05-02 MED ORDER — ROSUVASTATIN CALCIUM 20 MG PO TABS: 20.0000 mg | ORAL_TABLET | Freq: Every day | ORAL | 3 refills | Status: DC

## 2021-05-02 MED ORDER — CLOPIDOGREL BISULFATE 75 MG PO TABS: 75.0000 mg | ORAL_TABLET | Freq: Every day | ORAL | 0 refills | Status: DC

## 2021-05-02 NOTE — Progress Notes (Signed)
? ? ?  SUBJECTIVE:  ? ?CHIEF COMPLAINT / HPI:  ? ?Hyperlipidemia ?LDL checked at last visit on 03/01/2021 and showed total cholesterol of 226, LDL elevated to 144.  Patient had previously been taking rosuvastatin 5 mg but was increased at that visit to 20 mg daily.  Patient states she has been taking this medications without side effects. We will recheck lipid panel today. ? ?Dizziness ?Still feels dizzy upon standing or walking, grabs onto something and sits down if she can. Feels like things are spinning around her. Dizzy spell passes within at least 15 minutes, usually less. Admits to tinnitus but only when lying down.  Admits to blurry vision, went to eye doctor who said she was fine.  ? ?PERTINENT  PMH / PSH: Peripheral vascular disease, asthma, GERD, osteoarthritis, dizziness, hyperlipidemia, hypertension ? ?OBJECTIVE:  ? ?Vitals:  ? 05/02/21 0936  ?BP: (!) 141/76  ?Pulse: 80  ? ? ? ?General: NAD, pleasant, able to participate in exam ?Cardiac: RRR, no murmurs. ?Respiratory: CTAB, normal effort, No wheezes, rales or rhonchi ?Abdomen: Bowel sounds present, nontender, nondistended, soft ?Neuro: alert, cranial nerves II through XII intact, sensation intact, finger-nose-finger test normal, normal Romberg test, swaying and unsteadiness with semi tandem walk, unable to tandem walk due to unsteadiness and needing assistance.  Semont test performed which reproduced some dizziness, no nystagmus noted. ?Psych: Normal affect and mood ? ?ASSESSMENT/PLAN:  ? ?Hyperlipidemia ?-Continue Crestor 20 mg daily ?-Lipid panel checked today ? ?Dizziness ?Dizziness could possibly have a component of vestibular issue.  Dizziness reproducible with Romberg test although patient did not sway.  Dizziness also mildly reproduced with Semont test.  Patient prefers to continue to monitor her symptoms and we can consider a referral to vestibular rehab in the future if desired. ?  ?Balance disorder ?Neuro exam within normal limits with the  exception of unable to semi tandem or tandem walk due to unsteadiness ?-Physical therapy referral placed ? ?Dr. Precious Gilding, DO ?[place]  ? ? ? ? ?

## 2021-05-02 NOTE — Patient Instructions (Signed)
It was great to see you! Thank you for allowing me to participate in your care! ? ?I recommend that you always bring your medications to each appointment as this makes it easy to ensure you are on the correct medications and helps [place] not miss when refills are needed. ? ?Our plans for today:  ?-I am referring you to physical therapy, if you do not get a phone call regarding setting up an appointment within 2 weeks please call our office ? ?We are checking some labs today, I will call you if they are abnormal will send you a MyChart message or a letter if they are normal.  If you do not hear about your labs in the next 2 weeks please let us know. ? ?Take care and seek immediate care sooner if you develop any concerns.  ? ?Dr. Precious Gilding, DO ?[facility] ? ?

## 2021-05-03 ENCOUNTER — Other Ambulatory Visit: Payer: Self-pay | Admitting: Student

## 2021-05-03 ENCOUNTER — Other Ambulatory Visit: Payer: Self-pay | Admitting: Student in an Organized Health Care Education/Training Program

## 2021-05-03 LAB — LIPID PANEL
Chol/HDL Ratio: 4.6 ratio — ABNORMAL HIGH (ref 0.0–4.4)
Cholesterol, Total: 178 mg/dL (ref 100–199)
HDL: 39 mg/dL — ABNORMAL LOW (ref >39)
LDL Chol Calc (NIH): 97 mg/dL (ref 0–99)
Triglycerides: 249 mg/dL — ABNORMAL HIGH (ref 0–149)
VLDL Cholesterol Cal: 42 mg/dL — ABNORMAL HIGH (ref 5–40)

## 2021-05-09 DIAGNOSIS — I83893 Varicose veins of bilateral lower extremities with other complications: Secondary | ICD-10-CM | POA: Diagnosis not present

## 2021-05-12 ENCOUNTER — Telehealth: Payer: Self-pay

## 2021-05-12 NOTE — Telephone Encounter (Addendum)
Response faxed to requesting office at [phone] ? ?----- Message from [name], MD sent at 05/12/2021  8:55 AM EDT ----- ?Regarding: RE: Please advise ?Ok to hold pletal ?----- Message ----- ?From: Nicholas Lose, RN ?Sent: 05/12/2021   8:52 AM EDT ?To: [name], MD ?Subject: Please advise                                 ? ?Dr. Carlis Abbott ? ?Fax request received from [place], for patient to hold Pletal 3 days prior to scheduled Electrolysis- Left eye, Removal/Biopsy of eyelid under IV sedation on 05/18/21.  ? ?Patient last seen 1/10/023 for h/o peripheral vascular disease with right lower extremity short distance lifestyle limiting claudication.  Arteriogram on 08/28/2019 showed a very diseased SFA with a high-grade focal stenosis in the mid segment and also an occluded below-knee popliteal artery and occluded trifurcation with only a peroneal that reconstitutes in the mid calf, treated medically.  ? ?Please advise if OK to hold Pletal as requested.  ? ?Thanks ?Herma Ard ? ? ?

## 2021-05-17 DIAGNOSIS — I87393 Chronic venous hypertension (idiopathic) with other complications of bilateral lower extremity: Secondary | ICD-10-CM | POA: Diagnosis not present

## 2021-05-17 DIAGNOSIS — I872 Venous insufficiency (chronic) (peripheral): Secondary | ICD-10-CM | POA: Diagnosis not present

## 2021-05-18 DIAGNOSIS — D485 Neoplasm of uncertain behavior of skin: Secondary | ICD-10-CM | POA: Diagnosis not present

## 2021-05-18 DIAGNOSIS — D23121 Other benign neoplasm of skin of left upper eyelid, including canthus: Secondary | ICD-10-CM | POA: Diagnosis not present

## 2021-05-18 DIAGNOSIS — H04562 Stenosis of left lacrimal punctum: Secondary | ICD-10-CM | POA: Diagnosis not present

## 2021-05-18 DIAGNOSIS — H02054 Trichiasis without entropian left upper eyelid: Secondary | ICD-10-CM | POA: Diagnosis not present

## 2021-05-18 DIAGNOSIS — D487 Neoplasm of uncertain behavior of other specified sites: Secondary | ICD-10-CM | POA: Diagnosis not present

## 2021-06-10 ENCOUNTER — Telehealth: Payer: Self-pay

## 2021-06-10 NOTE — Telephone Encounter (Signed)
[facility] is requesting pt hold Pletal x 3 days for upcoming procedure on 06/29/21. Per MD, this is acceptable. I have faxed the clearance back to [facility].  ?

## 2021-06-14 DIAGNOSIS — I83892 Varicose veins of left lower extremities with other complications: Secondary | ICD-10-CM | POA: Diagnosis not present

## 2021-06-20 DIAGNOSIS — I872 Venous insufficiency (chronic) (peripheral): Secondary | ICD-10-CM | POA: Diagnosis not present

## 2021-06-20 DIAGNOSIS — Z09 Encounter for follow-up examination after completed treatment for conditions other than malignant neoplasm: Secondary | ICD-10-CM | POA: Diagnosis not present

## 2021-06-20 DIAGNOSIS — I83891 Varicose veins of right lower extremities with other complications: Secondary | ICD-10-CM | POA: Diagnosis not present

## 2021-06-24 DIAGNOSIS — I872 Venous insufficiency (chronic) (peripheral): Secondary | ICD-10-CM | POA: Diagnosis not present

## 2021-06-28 ENCOUNTER — Ambulatory Visit: Payer: Medicare Other

## 2021-06-28 ENCOUNTER — Ambulatory Visit: Payer: Medicare Other | Admitting: Allergy and Immunology

## 2021-06-28 VITALS — BP 130/60 | HR 72 | Temp 98.2°F | Resp 16 | Ht 59.45 in | Wt 129.2 lb

## 2021-06-28 DIAGNOSIS — I83812 Varicose veins of left lower extremities with pain: Secondary | ICD-10-CM | POA: Diagnosis not present

## 2021-06-28 DIAGNOSIS — K219 Gastro-esophageal reflux disease without esophagitis: Secondary | ICD-10-CM

## 2021-06-28 DIAGNOSIS — J4550 Severe persistent asthma, uncomplicated: Secondary | ICD-10-CM | POA: Diagnosis not present

## 2021-06-28 DIAGNOSIS — J3089 Other allergic rhinitis: Secondary | ICD-10-CM

## 2021-06-28 DIAGNOSIS — Z09 Encounter for follow-up examination after completed treatment for conditions other than malignant neoplasm: Secondary | ICD-10-CM | POA: Diagnosis not present

## 2021-06-28 MED ORDER — ALBUTEROL SULFATE HFA 108 (90 BASE) MCG/ACT IN AERS: 2.0000 | INHALATION_SPRAY | RESPIRATORY_TRACT | 1 refills | Status: DC | PRN

## 2021-06-28 MED ORDER — PANTOPRAZOLE SODIUM 40 MG PO TBEC: 40.0000 mg | DELAYED_RELEASE_TABLET | Freq: Every day | ORAL | 1 refills | Status: DC

## 2021-06-28 MED ORDER — CETIRIZINE HCL 10 MG PO TABS: 10.0000 mg | ORAL_TABLET | Freq: Every day | ORAL | 5 refills | Status: DC | PRN

## 2021-06-28 MED ORDER — ALBUTEROL SULFATE (2.5 MG/3ML) 0.083% IN NEBU: 2.5000 mg | INHALATION_SOLUTION | RESPIRATORY_TRACT | 1 refills | Status: DC | PRN

## 2021-06-28 NOTE — Patient Instructions (Signed)
  1. Continue Benralizumab injections    2. If needed:   A.  albuterol nebulization or ProAir HFA 2 puffs every 4-6 hours  B.  OTC cetirizine 10 mg tablet 1 time per day  3. Treat reflux / swallow issue:   A. Pantoprazole 40 mg - 1 tablet 1 time per day  B. OTC antacids if needed  4. Return to clinic in 6 months or earlier if problem
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
## 2021-06-29 ENCOUNTER — Encounter: Payer: Self-pay | Admitting: Allergy and Immunology

## 2021-07-01 NOTE — Addendum Note (Signed)
Addended by: Eloy End D on: 07/01/2021 05:35 PM   Modules accepted: Orders

## 2021-07-28 DIAGNOSIS — I83892 Varicose veins of left lower extremities with other complications: Secondary | ICD-10-CM | POA: Diagnosis not present

## 2021-08-23 ENCOUNTER — Ambulatory Visit (INDEPENDENT_AMBULATORY_CARE_PROVIDER_SITE_OTHER): Payer: Medicare Other

## 2021-08-23 DIAGNOSIS — J4550 Severe persistent asthma, uncomplicated: Secondary | ICD-10-CM | POA: Diagnosis not present

## 2021-08-25 DIAGNOSIS — I83811 Varicose veins of right lower extremities with pain: Secondary | ICD-10-CM | POA: Diagnosis not present

## 2021-08-25 DIAGNOSIS — M7989 Other specified soft tissue disorders: Secondary | ICD-10-CM | POA: Diagnosis not present

## 2021-08-25 DIAGNOSIS — I83891 Varicose veins of right lower extremities with other complications: Secondary | ICD-10-CM | POA: Diagnosis not present

## 2021-09-23 DIAGNOSIS — M7989 Other specified soft tissue disorders: Secondary | ICD-10-CM | POA: Diagnosis not present

## 2021-09-23 DIAGNOSIS — I83892 Varicose veins of left lower extremities with other complications: Secondary | ICD-10-CM | POA: Diagnosis not present

## 2021-09-23 DIAGNOSIS — I83812 Varicose veins of left lower extremities with pain: Secondary | ICD-10-CM | POA: Diagnosis not present

## 2021-09-30 IMAGING — CT CT ABD-PELV W/O CM
2 of 4 series · 15 of 46 positions shown, 17 images · non-contrast
Comparison: 09/18/2018

CLINICAL DATA: Right lower quadrant abdominal pain. Appendicitis
suspected.

EXAM:
CT ABDOMEN AND PELVIS WITHOUT CONTRAST
TECHNIQUE: Multidetector CT imaging of the abdomen and pelvis was performed
following the standard protocol without IV contrast.

[Series 2: routine abdomen pelvis without 5.00 br40 s3 axial · axial · non-contrast · 0.60mm/px · z∈[+1467,+1832]mm · 12 of 81 slices shown, 14 images]
[im 4/81  soft-tissue]
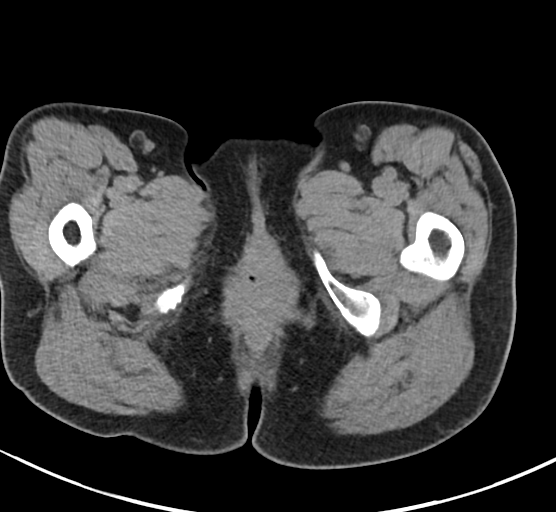
[im 4/81  bone]
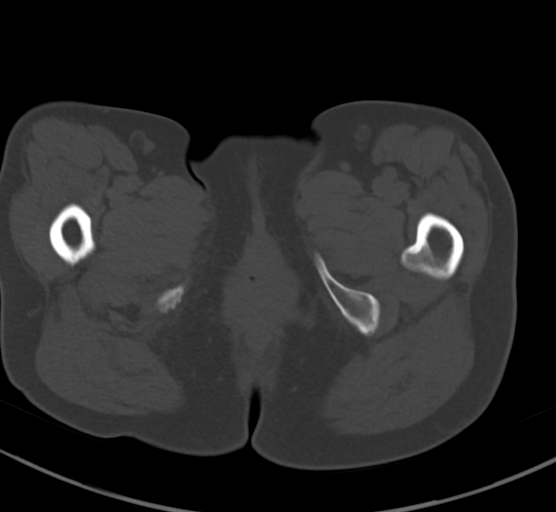
[im 11/81  soft-tissue]
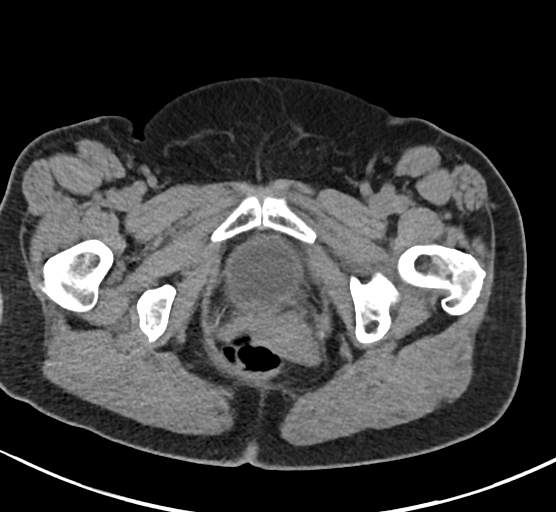
[im 17/81  soft-tissue]
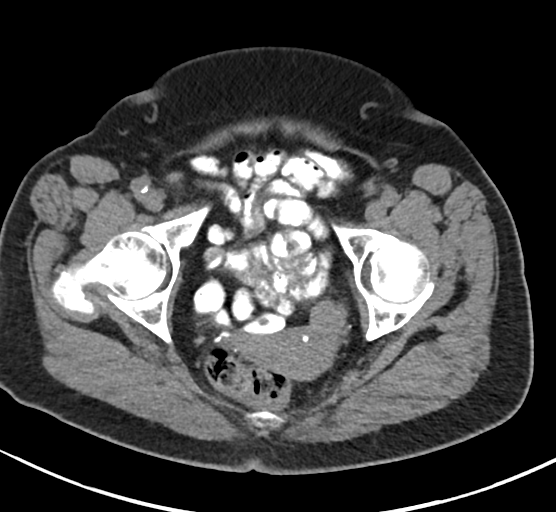
[im 24/81  soft-tissue]
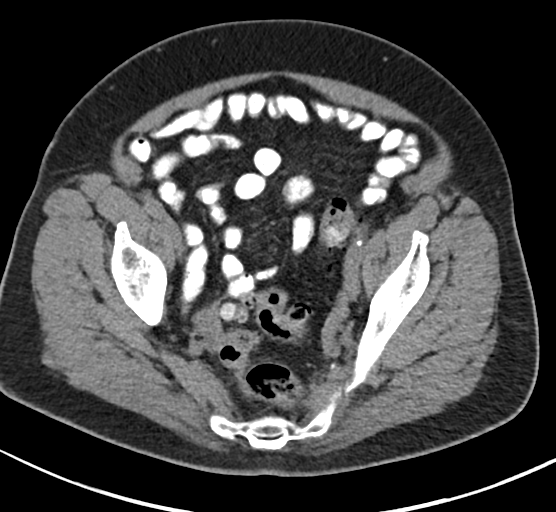
[im 31/81  soft-tissue]
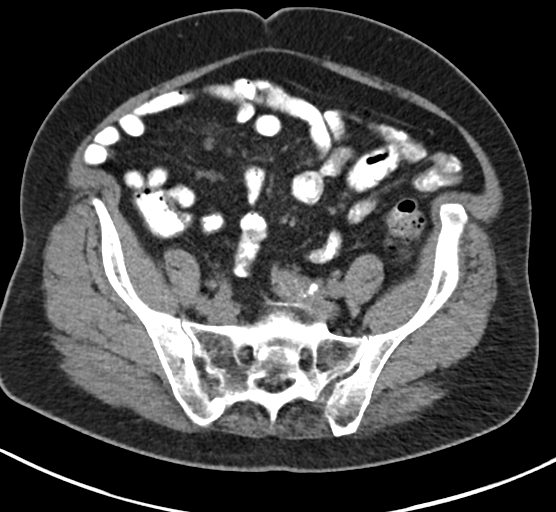
[im 37/81  soft-tissue]
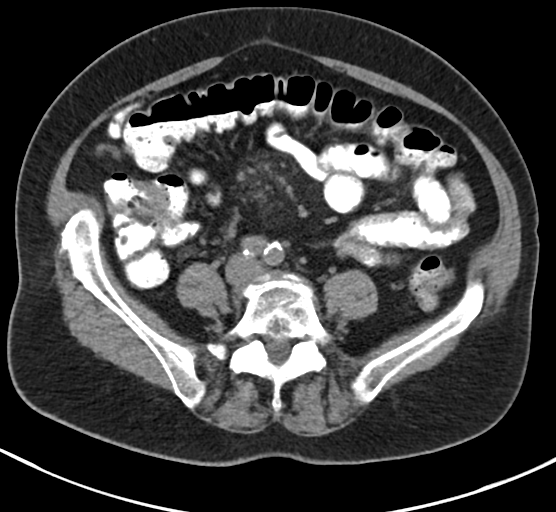
[im 44/81  soft-tissue]
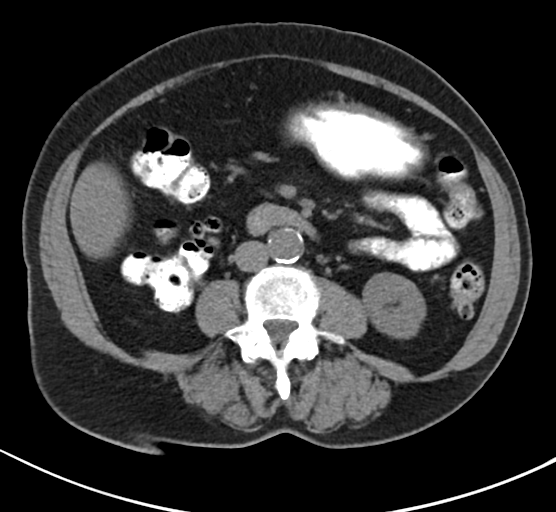
[im 51/81  soft-tissue]
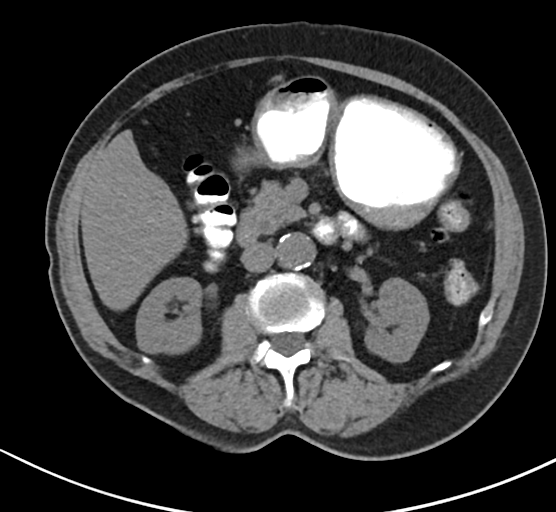
[im 57/81  soft-tissue]
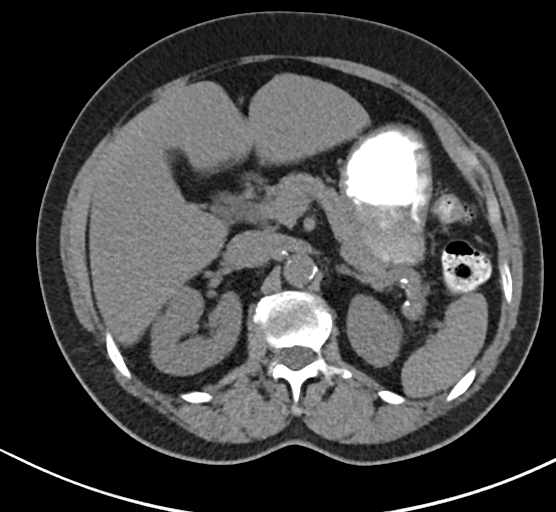
[im 57/81  bone]
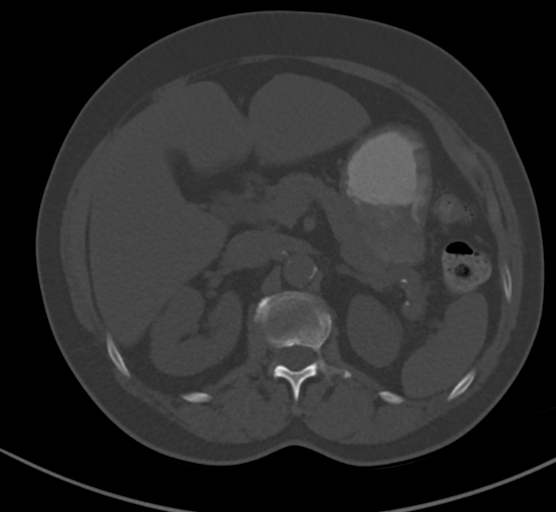
[im 64/81  soft-tissue]
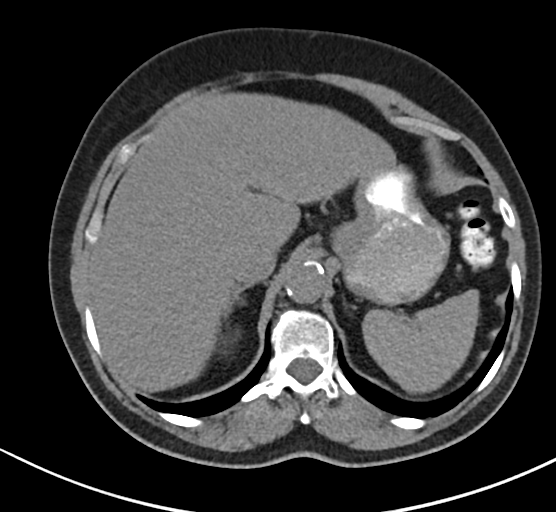
[im 71/81  soft-tissue]
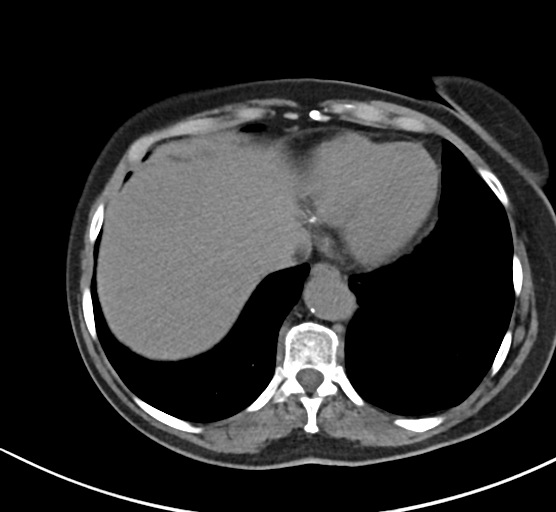
[im 77/81  soft-tissue]
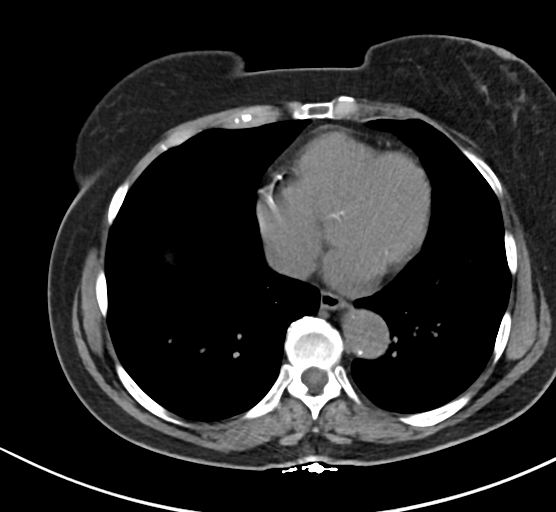

[Series 4: routine abdomen pelvis without 2.00 br40 s3 cor · coronal · non-contrast · 0.65mm/px · 3 of 151 slices shown]
[im 51/151  soft-tissue]
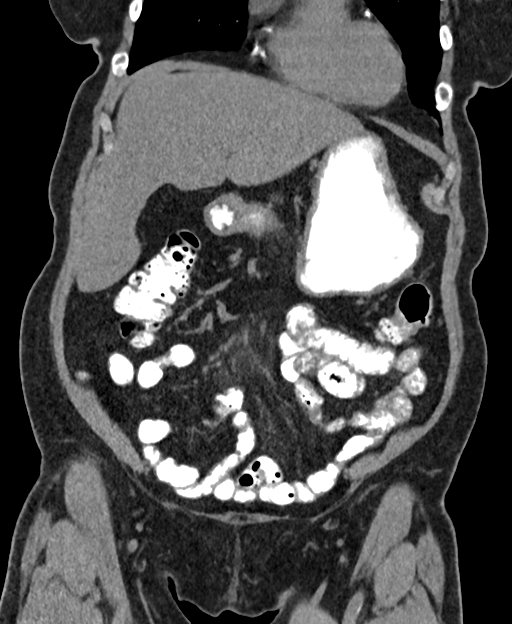
[im 67/151  soft-tissue]
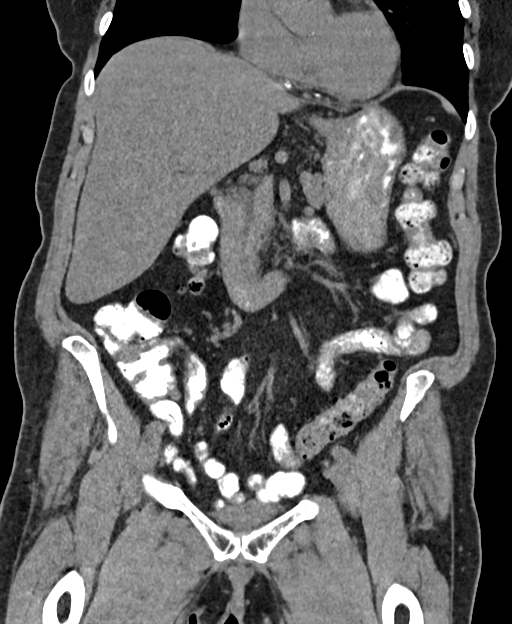
[im 84/151  soft-tissue]
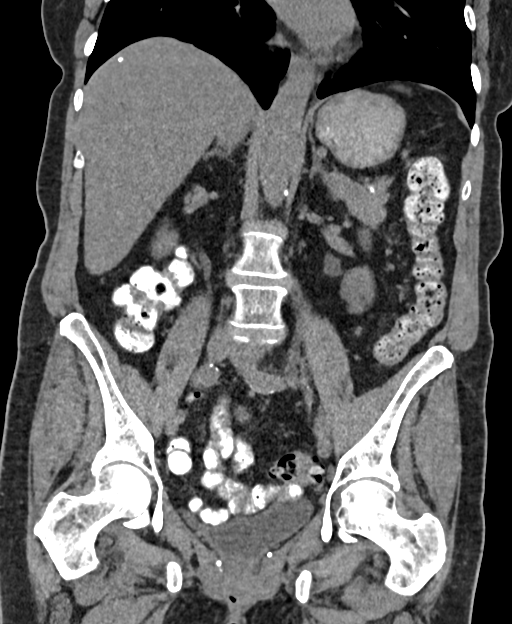

[15 of 46 positions shown; findings below may reference images not displayed]

FINDINGS: Lower chest: Coronary artery calcification is evident.

Hepatobiliary: Tiny hypodensity anterior left liver is similar, most
likely benign. Liver is enlarged at 18.2 cm craniocaudal length. No
focal abnormality in the liver on this study without intravenous
contrast. Gallbladder surgically absent No intrahepatic or
extrahepatic biliary dilation.

Pancreas: No focal mass lesion. No dilatation of the main duct. No
intraparenchymal cyst. No peripancreatic edema.

Spleen: No splenomegaly. No focal mass lesion.

Adrenals/Urinary Tract: No adrenal nodule or mass. Right kidney
unremarkable. Punctate nonobstructing stone noted interpolar left
kidney on coronal imaging. No evidence for hydroureter. The urinary
bladder appears normal for the degree of distention.

Stomach/Bowel: Stomach is unremarkable. No gastric wall thickening.
No evidence of outlet obstruction. Duodenum is normally positioned
as is the ligament of Treitz. No small bowel wall thickening. No
small bowel dilatation. The terminal ileum is normal. The appendix
is normal. Diverticuli are seen scattered along the entire length of
the colon without CT findings of diverticulitis.

Vascular/Lymphatic: There is abdominal aortic atherosclerosis
without aneurysm. There is no gastrohepatic or hepatoduodenal
ligament lymphadenopathy. No retroperitoneal or mesenteric
lymphadenopathy. No pelvic sidewall lymphadenopathy.

Reproductive: The uterus is unremarkable.  There is no adnexal mass.

Other: No intraperitoneal free fluid.

Musculoskeletal: No worrisome lytic or sclerotic osseous
abnormality.
IMPRESSION: 1. No acute findings in the abdomen or pelvis. Specifically, no
findings to explain the patient's history of right lower quadrant
pain. The appendix and terminal ileum are normal. No right adnexal
mass.
2. Diffuse colonic diverticulosis without diverticulitis.
3. Punctate nonobstructing left renal stone.
4. Hepatomegaly.
5. Aortic Atherosclerosis ([id number]).

## 2021-10-18 ENCOUNTER — Ambulatory Visit: Payer: Medicare Other

## 2021-10-26 ENCOUNTER — Ambulatory Visit: Payer: Medicare Other | Admitting: *Deleted

## 2021-10-26 DIAGNOSIS — J4550 Severe persistent asthma, uncomplicated: Secondary | ICD-10-CM | POA: Diagnosis not present

## 2021-10-27 DIAGNOSIS — I83812 Varicose veins of left lower extremities with pain: Secondary | ICD-10-CM | POA: Diagnosis not present

## 2021-10-27 DIAGNOSIS — I83892 Varicose veins of left lower extremities with other complications: Secondary | ICD-10-CM | POA: Diagnosis not present

## 2021-11-18 ENCOUNTER — Ambulatory Visit: Payer: Medicare Other

## 2021-11-21 ENCOUNTER — Telehealth: Payer: Self-pay | Admitting: Vascular Surgery

## 2021-11-21 NOTE — Telephone Encounter (Signed)
-----   Message from Nicholas Lose, RN sent at 11/18/2021  3:02 PM EDT ----- Regarding: Overdue recall Hey,   Per CJC's last office visit note on 02/08/21 since she didn't decide to do procedure, "I will schedule her for follow-up in 6 months with ABIs."  Can you please call to schedule her for this overdue recall.   Thanks a bunch! Anne Warren

## 2021-11-23 DIAGNOSIS — I83811 Varicose veins of right lower extremities with pain: Secondary | ICD-10-CM | POA: Diagnosis not present

## 2021-11-23 DIAGNOSIS — I83891 Varicose veins of right lower extremities with other complications: Secondary | ICD-10-CM | POA: Diagnosis not present

## 2021-11-23 DIAGNOSIS — M7989 Other specified soft tissue disorders: Secondary | ICD-10-CM | POA: Diagnosis not present

## 2021-12-19 ENCOUNTER — Ambulatory Visit: Payer: Medicare Other | Admitting: *Deleted

## 2021-12-19 DIAGNOSIS — J4550 Severe persistent asthma, uncomplicated: Secondary | ICD-10-CM

## 2021-12-21 ENCOUNTER — Ambulatory Visit: Payer: Medicare Other

## 2022-01-03 ENCOUNTER — Ambulatory Visit: Payer: Medicare Other | Admitting: Allergy and Immunology

## 2022-01-03 VITALS — BP 120/66 | HR 81 | Temp 98.2°F | Resp 16 | Ht 60.25 in | Wt 132.7 lb

## 2022-01-03 DIAGNOSIS — K219 Gastro-esophageal reflux disease without esophagitis: Secondary | ICD-10-CM | POA: Diagnosis not present

## 2022-01-03 DIAGNOSIS — J4550 Severe persistent asthma, uncomplicated: Secondary | ICD-10-CM

## 2022-01-03 DIAGNOSIS — R1310 Dysphagia, unspecified: Secondary | ICD-10-CM | POA: Diagnosis not present

## 2022-01-03 DIAGNOSIS — J3089 Other allergic rhinitis: Secondary | ICD-10-CM | POA: Diagnosis not present

## 2022-01-03 DIAGNOSIS — H6992 Unspecified Eustachian tube disorder, left ear: Secondary | ICD-10-CM | POA: Diagnosis not present

## 2022-01-03 MED ORDER — CETIRIZINE HCL 10 MG PO TABS: 10.0000 mg | ORAL_TABLET | Freq: Every day | ORAL | 5 refills | Status: DC | PRN

## 2022-01-03 MED ORDER — PANTOPRAZOLE SODIUM 40 MG PO TBEC: 40.0000 mg | DELAYED_RELEASE_TABLET | Freq: Every day | ORAL | 1 refills | Status: DC

## 2022-01-03 MED ORDER — ALBUTEROL SULFATE (2.5 MG/3ML) 0.083% IN NEBU: 2.5000 mg | INHALATION_SOLUTION | RESPIRATORY_TRACT | 1 refills | Status: DC | PRN

## 2022-01-03 MED ORDER — ALBUTEROL SULFATE HFA 108 (90 BASE) MCG/ACT IN AERS: 2.0000 | INHALATION_SPRAY | RESPIRATORY_TRACT | 1 refills | Status: DC | PRN

## 2022-01-03 NOTE — Patient Instructions (Addendum)
  1. Continue Benralizumab injections    2. If needed:   A.  albuterol nebulization or ProAir HFA 2 puffs every 4-6 hours  B.  OTC cetirizine 10 mg tablet 1 time per day  3. Can treat reflux / swallow issue:   A. Pantoprazole 40 mg - 1 tablet 1 time per day  B. OTC antacids if needed  4. Can visit with ENT about left ear. Husband to make appointment  5. Return to clinic in 6 months or earlier if problem
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
## 2022-01-04 ENCOUNTER — Encounter: Payer: Self-pay | Admitting: Allergy and Immunology

## 2022-02-13 ENCOUNTER — Ambulatory Visit: Payer: Medicare Other

## 2022-02-13 DIAGNOSIS — J4550 Severe persistent asthma, uncomplicated: Secondary | ICD-10-CM

## 2022-02-22 DIAGNOSIS — I83893 Varicose veins of bilateral lower extremities with other complications: Secondary | ICD-10-CM | POA: Diagnosis not present

## 2022-02-28 DIAGNOSIS — H903 Sensorineural hearing loss, bilateral: Secondary | ICD-10-CM | POA: Diagnosis not present

## 2022-02-28 DIAGNOSIS — H905 Unspecified sensorineural hearing loss: Secondary | ICD-10-CM | POA: Diagnosis not present

## 2022-02-28 DIAGNOSIS — M26609 Unspecified temporomandibular joint disorder, unspecified side: Secondary | ICD-10-CM | POA: Diagnosis not present

## 2022-02-28 DIAGNOSIS — L299 Pruritus, unspecified: Secondary | ICD-10-CM | POA: Diagnosis not present

## 2022-02-28 DIAGNOSIS — H9312 Tinnitus, left ear: Secondary | ICD-10-CM | POA: Insufficient documentation

## 2022-02-28 HISTORY — DX: Pruritus, unspecified: L29.9

## 2022-02-28 HISTORY — DX: Unspecified temporomandibular joint disorder, unspecified side: M26.609

## 2022-03-15 DIAGNOSIS — I83892 Varicose veins of left lower extremities with other complications: Secondary | ICD-10-CM | POA: Diagnosis not present

## 2022-03-15 DIAGNOSIS — I87392 Chronic venous hypertension (idiopathic) with other complications of left lower extremity: Secondary | ICD-10-CM | POA: Diagnosis not present

## 2022-03-28 ENCOUNTER — Other Ambulatory Visit: Payer: Self-pay | Admitting: *Deleted

## 2022-03-28 MED ORDER — FASENRA 30 MG/ML ~~LOC~~ SOSY: 30.0000 mg | PREFILLED_SYRINGE | SUBCUTANEOUS | 6 refills | Status: DC

## 2022-04-06 DIAGNOSIS — M79604 Pain in right leg: Secondary | ICD-10-CM | POA: Diagnosis not present

## 2022-04-06 DIAGNOSIS — I83891 Varicose veins of right lower extremities with other complications: Secondary | ICD-10-CM | POA: Diagnosis not present

## 2022-04-10 DIAGNOSIS — H353131 Nonexudative age-related macular degeneration, bilateral, early dry stage: Secondary | ICD-10-CM | POA: Diagnosis not present

## 2022-04-11 ENCOUNTER — Ambulatory Visit (INDEPENDENT_AMBULATORY_CARE_PROVIDER_SITE_OTHER): Payer: Medicare Other

## 2022-04-11 DIAGNOSIS — J4550 Severe persistent asthma, uncomplicated: Secondary | ICD-10-CM | POA: Diagnosis not present

## 2022-04-12 ENCOUNTER — Encounter: Payer: Self-pay | Admitting: Student

## 2022-04-12 ENCOUNTER — Ambulatory Visit (INDEPENDENT_AMBULATORY_CARE_PROVIDER_SITE_OTHER): Payer: Medicare Other | Admitting: Student

## 2022-04-12 VITALS — BP 153/83 | HR 70 | Ht 60.0 in | Wt 130.4 lb

## 2022-04-12 DIAGNOSIS — R1032 Left lower quadrant pain: Secondary | ICD-10-CM | POA: Diagnosis not present

## 2022-04-12 DIAGNOSIS — I10 Essential (primary) hypertension: Secondary | ICD-10-CM | POA: Diagnosis not present

## 2022-04-12 DIAGNOSIS — R7401 Elevation of levels of liver transaminase levels: Secondary | ICD-10-CM

## 2022-04-12 DIAGNOSIS — I739 Peripheral vascular disease, unspecified: Secondary | ICD-10-CM

## 2022-04-12 MED ORDER — AMLODIPINE BESYLATE 2.5 MG PO TABS: 2.5000 mg | ORAL_TABLET | Freq: Every day | ORAL | 3 refills | Status: DC

## 2022-04-12 NOTE — Progress Notes (Unsigned)
    SUBJECTIVE:   CHIEF COMPLAINT / HPI:   HTN Previously took Amlodipine 2.5 mg but ran out and did not request to have it refilled.  Has not been taking it for several months.  Peripheral vascular disease with claudication Is following with [facility] and per pt is getting an injection but not sure what it is called. No longer taking plavix or pletal.  Has an upcoming appointment with them.  Lower abdominal pain Chronic and ongoing for years but worsening over the last few months and currently located in left lower quadrant.  It is intermittent and she has recently noticed that her lower abdomen feels bloated and swollen.  No nausea or vomiting.  Has regular bowel movements daily and no blood in stools.  CT of abdomen and pelvis in 2022 showed diffuse diverticulosis.    PERTINENT  PMH / PSH: HTN, peripheral vascular disease, diverticulosis, asthma transaminitis  OBJECTIVE:   BP (!) 153/83   Pulse 70   Ht 5' (1.524 m)   Wt 130 lb 6.4 oz (59.1 kg)   SpO2 100%   BMI 25.47 kg/m    General: NAD, pleasant, able to participate in exam Cardiac: RRR, no murmurs. Respiratory: CTAB, normal effort, No wheezes, rales or rhonchi Abdomen: Bowel sounds present, nontender, nondistended, soft Skin: warm and dry Neuro: alert, no obvious focal deficits Psych: Normal affect and mood  ASSESSMENT/PLAN:   Essential hypertension Uncontrolled.  Will resume amlodipine 2.5 mg daily. -CMP to monitor kidney function -Return in 2 weeks for follow-up  Left lower quadrant pain Abdominal exam is reassuring.  Pain could be due to diverticulosis but due to patient's perception that her lower abdomen is bloated and swollen, I am concerned for potential uterine or ovarian malignancy.  Will obtain transvaginal ultrasound to rule this out.  ED precautions discussed.  Peripheral vascular disease of extremity with claudication ([place]) Will obtain records from [facility].  Transaminitis Mild and chronic and RUQ ultrasound in 2022 showed likely fatty liver.  Obtaining labs to check kidney function, we will go ahead and get CMP to monitor AST and ALT.     Dr. Precious Gilding, [place]

## 2022-04-12 NOTE — Patient Instructions (Signed)
It was great to see you! Thank you for allowing me to participate in your care!  I recommend that you always bring your medications to each appointment as this makes it easy to ensure you are on the correct medications and helps [place] not miss when refills are needed.  Our plans for today:  -We are restarting your amlodipine.  Please take this every night at bedtime. -I have ordered a pelvic ultrasound, please go to the appointment listed below -Return for a follow-up appointment in 2 weeks or sooner if needed.  If you develop severe abdominal pain, blood in your stool, fever, vomiting or nausea, please go to the emergency department  We are checking some labs today, I will call you if they are abnormal will send you a MyChart message or a letter if they are normal.  If you do not hear about your labs in the next 2 weeks please let us know.  Take care and seek immediate care sooner if you develop any concerns.   Dr. Precious Gilding, DO [facility]

## 2022-04-13 LAB — COMPREHENSIVE METABOLIC PANEL
ALT: 35 IU/L — ABNORMAL HIGH (ref 0–32)
AST: 35 IU/L (ref 0–40)
Albumin/Globulin Ratio: 1.7 (ref 1.2–2.2)
Albumin: 4.8 g/dL — ABNORMAL HIGH (ref 3.7–4.7)
Alkaline Phosphatase: 94 IU/L (ref 44–121)
BUN/Creatinine Ratio: 16 (ref 12–28)
BUN: 12 mg/dL (ref 8–27)
Bilirubin Total: 0.5 mg/dL (ref 0.0–1.2)
CO2: 24 mmol/L (ref 20–29)
Calcium: 10.2 mg/dL (ref 8.7–10.3)
Chloride: 102 mmol/L (ref 96–106)
Creatinine, Ser: 0.77 mg/dL (ref 0.57–1.00)
Globulin, Total: 2.9 g/dL (ref 1.5–4.5)
Glucose: 91 mg/dL (ref 70–99)
Potassium: 4.3 mmol/L (ref 3.5–5.2)
Sodium: 142 mmol/L (ref 134–144)
Total Protein: 7.7 g/dL (ref 6.0–8.5)
eGFR: 77 mL/min/{1.73_m2} (ref >59)

## 2022-04-13 NOTE — Assessment & Plan Note (Signed)
Will obtain records from [place] [facility].

## 2022-04-13 NOTE — Assessment & Plan Note (Addendum)
Abdominal exam is reassuring.  Pain could be due to diverticulosis but due to patient's perception that her lower abdomen is bloated and swollen, I am concerned for potential uterine or ovarian malignancy.  Will obtain transvaginal ultrasound to rule this out.  ED precautions discussed.

## 2022-04-13 NOTE — Assessment & Plan Note (Addendum)
Uncontrolled.  Will resume amlodipine 2.5 mg daily. -CMP to monitor kidney function -Return in 2 weeks for follow-up

## 2022-04-13 NOTE — Assessment & Plan Note (Signed)
>>  ASSESSMENT AND PLAN FOR PERIPHERAL VASCULAR DISEASE OF EXTREMITY WITH CLAUDICATION (HCC) WRITTEN ON 04/13/2022 10:35 AM BY Erick Alley, DO  Will obtain records from [place] [facility].

## 2022-04-13 NOTE — Assessment & Plan Note (Signed)
Mild and chronic and RUQ ultrasound in 2022 showed likely fatty liver.  Obtaining labs to check kidney function, we will go ahead and get CMP to monitor AST and ALT.

## 2022-04-19 ENCOUNTER — Ambulatory Visit (HOSPITAL_COMMUNITY)
Admission: RE | Admit: 2022-04-19 | Discharge: 2022-04-19 | Disposition: A | Discharge status: Home / Self Care | Payer: Medicare Other | Source: Ambulatory Visit | Attending: Family Medicine | Admitting: Family Medicine

## 2022-04-19 DIAGNOSIS — R1032 Left lower quadrant pain: Secondary | ICD-10-CM

## 2022-04-20 ENCOUNTER — Encounter: Payer: Self-pay | Admitting: Student

## 2022-04-25 ENCOUNTER — Ambulatory Visit: Payer: Medicare Other | Admitting: Student

## 2022-04-27 DIAGNOSIS — I83892 Varicose veins of left lower extremities with other complications: Secondary | ICD-10-CM | POA: Diagnosis not present

## 2022-05-09 ENCOUNTER — Ambulatory Visit (INDEPENDENT_AMBULATORY_CARE_PROVIDER_SITE_OTHER): Payer: Medicare Other | Admitting: Family Medicine

## 2022-05-09 ENCOUNTER — Encounter: Payer: Self-pay | Admitting: Family Medicine

## 2022-05-09 VITALS — BP 128/68 | HR 75 | Ht 60.0 in | Wt 132.0 lb

## 2022-05-09 DIAGNOSIS — K219 Gastro-esophageal reflux disease without esophagitis: Secondary | ICD-10-CM | POA: Diagnosis not present

## 2022-05-09 DIAGNOSIS — I10 Essential (primary) hypertension: Secondary | ICD-10-CM

## 2022-05-09 DIAGNOSIS — R5383 Other fatigue: Secondary | ICD-10-CM | POA: Diagnosis not present

## 2022-05-09 DIAGNOSIS — R1032 Left lower quadrant pain: Secondary | ICD-10-CM | POA: Diagnosis not present

## 2022-05-09 MED ORDER — OMEPRAZOLE 20 MG PO CPDR
20.0000 mg | DELAYED_RELEASE_CAPSULE | Freq: Every day | ORAL | 3 refills | Status: DC
Start: 2022-05-09 — End: 2023-01-17

## 2022-05-09 NOTE — Patient Instructions (Signed)
Good to see you today - Thank you for coming in  Things we discussed today:  For the stomach pain take omeprazole 20 mg once a day See your stomach doctor  Please always bring your medication bottles  Come back to see me in 1 month  Walk 5 minutes at least every day instead of medicine

## 2022-05-09 NOTE — Assessment & Plan Note (Signed)
Sounds more mood related than any specific somatic cause.  Recent blood work and [place] were normal.  Could consider TSH but these have been normal in the past.  Will slowly start exercise.   Discuss other possible treatments last visit

## 2022-05-09 NOTE — Assessment & Plan Note (Signed)
Recent US reassuring.  Trial of PPI and regular exercise and to follow up with her GI physician

## 2022-05-09 NOTE — Assessment & Plan Note (Signed)
Controlled today on current medications - continue

## 2022-05-09 NOTE — Progress Notes (Unsigned)
    SUBJECTIVE:   CHIEF COMPLAINT / HPI:   Essential hypertension Did not bring in her medications.  Thinks taking amlodipine daily.  No edema   Left lower quadrant pain Still present intermittently.  Does not think taking any acid reflux medications.   No bleeding or vomiting. Has regular bowel movement  Vaginal [place] was reassuringists.   Transaminitis Recent CMET showed improved AST and ALT.  Mood Often feels tired and not like doing anything.  Does not exercise regularly.  Does not like taking pills    OBJECTIVE:   BP 128/68   Pulse 75   Ht 5' (1.524 m)   Wt 132 lb (59.9 kg)   SpO2 96%   BMI 25.78 kg/m   Healthy appearing Heart - Regular rate and rhythm.  No murmurs, gallops or rubs.    Lungs:  Normal respiratory effort, chest expands symmetrically. Lungs are clear to auscultation, no crackles or wheezes. Neck:  No deformities, thyromegaly, masses, or tenderness noted.   Supple with full range of motion without pain. Ears:  External ear exam shows no significant lesions or deformities.  Otoscopic examination reveals clear canals, tympanic membranes are intact bilaterally without bulging, retraction, inflammation or discharge. Hearing is grossly normal bilaterall   ASSESSMENT/PLAN:   Essential hypertension Assessment & Plan: Controlled today on current medications - continue   Gastroesophageal reflux disease, unspecified whether esophagitis present Assessment & Plan: Not taking a PPI.  Will start as a trial.  She plans on follow up with her GI doctor  Orders: -     Omeprazole; Take 1 capsule (20 mg total) by mouth daily.  Dispense: 30 capsule; Refill: 3  Left lower quadrant pain Assessment & Plan: Recent US reassuring.  Trial of PPI and regular exercise and to follow up with her GI physician   Other fatigue Assessment & Plan: Sounds more mood related than any specific somatic cause.  Recent blood work and [place] were normal.  Could consider TSH but these have been  normal in the past.  Will slowly start exercise.   Discuss other possible treatments last visit      Patient Instructions  Good to see you today - Thank you for coming in  Things we discussed today:  For the stomach pain take omeprazole 20 mg once a day See your stomach doctor  Please always bring your medication bottles  Come back to see me in 1 month  Walk 5 minutes at least every day instead of medicine    Carney Living, MD [facility]

## 2022-05-09 NOTE — Assessment & Plan Note (Signed)
Not taking a PPI.  Will start as a trial.  She plans on follow up with her GI doctor

## 2022-05-17 DIAGNOSIS — I83811 Varicose veins of right lower extremities with pain: Secondary | ICD-10-CM | POA: Diagnosis not present

## 2022-05-17 DIAGNOSIS — M7989 Other specified soft tissue disorders: Secondary | ICD-10-CM | POA: Diagnosis not present

## 2022-05-17 DIAGNOSIS — I83891 Varicose veins of right lower extremities with other complications: Secondary | ICD-10-CM | POA: Diagnosis not present

## 2022-06-06 ENCOUNTER — Ambulatory Visit (INDEPENDENT_AMBULATORY_CARE_PROVIDER_SITE_OTHER): Payer: Medicare Other

## 2022-06-06 DIAGNOSIS — J4550 Severe persistent asthma, uncomplicated: Secondary | ICD-10-CM | POA: Diagnosis not present

## 2022-06-07 ENCOUNTER — Encounter: Payer: Self-pay | Admitting: Family Medicine

## 2022-06-07 ENCOUNTER — Ambulatory Visit (INDEPENDENT_AMBULATORY_CARE_PROVIDER_SITE_OTHER): Payer: Medicare Other | Admitting: Family Medicine

## 2022-06-07 ENCOUNTER — Other Ambulatory Visit: Payer: Self-pay

## 2022-06-07 VITALS — BP 101/66 | HR 68 | Ht 60.0 in | Wt 129.8 lb

## 2022-06-07 DIAGNOSIS — I10 Essential (primary) hypertension: Secondary | ICD-10-CM

## 2022-06-07 DIAGNOSIS — K219 Gastro-esophageal reflux disease without esophagitis: Secondary | ICD-10-CM

## 2022-06-07 DIAGNOSIS — R1032 Left lower quadrant pain: Secondary | ICD-10-CM | POA: Diagnosis not present

## 2022-06-07 NOTE — Assessment & Plan Note (Signed)
Improved on PPI.  Stop H2 blocker and monitor.  Use Tums as needed

## 2022-06-07 NOTE — Assessment & Plan Note (Signed)
Blood pressure well controlled today and sounds like she is  having symptoms of low blood pressure with standing bending.  Stop amlodipine and monitor Encourage continued exercise

## 2022-06-07 NOTE — Assessment & Plan Note (Signed)
Seems improved.  Will monitor

## 2022-06-07 NOTE — Patient Instructions (Addendum)
Good to see you today - Thank you for coming in  Things we discussed today:  Stop taking amlodipine for the blood pressure   and stop the famotidine for the stomach  When you have heartburn take a Tums tablet  See your stomach doctor  Keep walking every day   Please always bring your medication bottles  Come back to see me in 3 months    Qu bueno verte hoy. Gracias por venir.  Cosas que discutimos hoy:  Deje de tomar amlodipino para la presin arterial.  y dejar de tomar famotidina para el estomago  Cuando tenga acidez de [place], tome una tableta de Tums.  Consulte a su mdico estomacal  Sigue caminando [employer].  Por favor traiga siempre sus frascos de medicamentos.  Vuelve a verme en 3 meses.

## 2022-06-07 NOTE — Progress Notes (Signed)
    SUBJECTIVE:   CHIEF COMPLAINT / HPI:   Essential hypertension She feels lightheadness when she stands or bends over.  Brings her medications which do not include amlodipine.  Unclear if she is taking this.  Can walk for 30 minutes without symptoms    Gastroesophageal reflux disease, unspecified whether esophagitis present Taking famotidine and just started protonix.  Is improved.  No bleeding or vomiting   Left lower quadrant pain   Other fatigue Improved.  Is exercising regularly    OBJECTIVE:   BP 101/66   Pulse 68   Ht 5' (1.524 m)   Wt 129 lb 12.8 oz (58.9 kg)   SpO2 97%   BMI 25.35 kg/m   Alert Able to stand walk around the room and get on table without delay or symptoms Heart - Regular rate and rhythm.  No murmurs, gallops or rubs.    Lungs:  Normal respiratory effort, chest expands symmetrically. Lungs are clear to auscultation, no crackles or wheezes. Extremities:  No cyanosis, edema, or deformity noted with good range of motion of all major joints.   Able to walk on heels and toes  ASSESSMENT/PLAN:   Essential hypertension Assessment & Plan: Blood pressure well controlled today and sounds like she is  having symptoms of low blood pressure with standing bending.  Stop amlodipine and monitor Encourage continued exercise    Gastroesophageal reflux disease, unspecified whether esophagitis present Assessment & Plan: Improved on PPI.  Stop H2 blocker and monitor.  Use Tums as needed    Left lower quadrant pain Assessment & Plan: Seems improved.  Will monitor       Patient Instructions  Good to see you today - Thank you for coming in  Things we discussed today:  Stop taking amlodipine for the blood pressure   and stop the famotidine for the stomach  When you have heartburn take a Tums tablet  See your stomach doctor  Keep walking every day   Please always bring your medication bottles  Come back to see me in 3 months    Qu bueno verte  hoy. Gracias por venir.  Cosas que discutimos hoy:  Deje de tomar amlodipino para la presin arterial.  y dejar de tomar famotidina para el estomago  Cuando tenga acidez de [place], tome una tableta de Tums.  Consulte a su mdico estomacal  Sigue caminando [employer].  Por favor traiga siempre sus frascos de medicamentos.  Vuelve a verme en 3 meses.   Carney Living, MD [facility]

## 2022-06-14 DIAGNOSIS — I83891 Varicose veins of right lower extremities with other complications: Secondary | ICD-10-CM | POA: Diagnosis not present

## 2022-06-14 DIAGNOSIS — I83811 Varicose veins of right lower extremities with pain: Secondary | ICD-10-CM | POA: Diagnosis not present

## 2022-07-04 ENCOUNTER — Ambulatory Visit: Payer: Medicare Other | Admitting: Allergy and Immunology

## 2022-07-06 ENCOUNTER — Other Ambulatory Visit: Payer: Self-pay | Admitting: Pharmacist

## 2022-07-06 NOTE — Progress Notes (Signed)
This patient is appearing on the insurance-provided list for being at risk of failing the adherence measure for cholesterol medications this calendar year.   Medication: rosuvastatin Last fill date: 2023   Outreached patient to discuss adherence, any barriers to adherence, and consider a referral to me for medication management support.

## 2022-07-13 ENCOUNTER — Encounter: Payer: Self-pay | Admitting: Physician Assistant

## 2022-07-13 ENCOUNTER — Ambulatory Visit: Payer: Medicare Other | Admitting: Physician Assistant

## 2022-07-13 ENCOUNTER — Other Ambulatory Visit (INDEPENDENT_AMBULATORY_CARE_PROVIDER_SITE_OTHER): Payer: Medicare Other

## 2022-07-13 DIAGNOSIS — Z86010 Personal history of colonic polyps: Secondary | ICD-10-CM

## 2022-07-13 DIAGNOSIS — K5732 Diverticulitis of large intestine without perforation or abscess without bleeding: Secondary | ICD-10-CM | POA: Diagnosis not present

## 2022-07-13 DIAGNOSIS — R1032 Left lower quadrant pain: Secondary | ICD-10-CM

## 2022-07-13 LAB — CBC WITH DIFFERENTIAL/PLATELET
Basophils Absolute: 0 10*3/uL (ref 0.0–0.1)
Basophils Relative: 0.4 % (ref 0.0–3.0)
Eosinophils Absolute: 0 10*3/uL (ref 0.0–0.7)
Eosinophils Relative: 0 % (ref 0.0–5.0)
HCT: 43.4 % (ref 36.0–46.0)
Hemoglobin: 14.1 g/dL (ref 12.0–15.0)
Lymphocytes Relative: 23.7 % (ref 12.0–46.0)
Lymphs Abs: 1.5 10*3/uL (ref 0.7–4.0)
MCHC: 32.6 g/dL (ref 30.0–36.0)
MCV: 89.9 fl (ref 78.0–100.0)
Monocytes Absolute: 0.5 10*3/uL (ref 0.1–1.0)
Monocytes Relative: 7 % (ref 3.0–12.0)
Neutro Abs: 4.5 10*3/uL (ref 1.4–7.7)
Neutrophils Relative %: 68.9 % (ref 43.0–77.0)
Platelets: 231 10*3/uL (ref 150.0–400.0)
RBC: 4.83 Mil/uL (ref 3.87–5.11)
RDW: 13.4 % (ref 11.5–15.5)
WBC: 6.5 10*3/uL (ref 4.0–10.5)

## 2022-07-13 LAB — BASIC METABOLIC PANEL
BUN: 18 mg/dL (ref 6–23)
CO2: 28 mEq/L (ref 19–32)
Calcium: 9.8 mg/dL (ref 8.4–10.5)
Chloride: 104 mEq/L (ref 96–112)
Creatinine, Ser: 0.75 mg/dL (ref 0.40–1.20)
GFR: 74.06 mL/min (ref >60.00)
Glucose, Bld: 103 mg/dL — ABNORMAL HIGH (ref 70–99)
Potassium: 4.2 mEq/L (ref 3.5–5.1)
Sodium: 141 mEq/L (ref 135–145)

## 2022-07-13 MED ORDER — AMOXICILLIN 875 MG PO TABS: 875.0000 mg | ORAL_TABLET | Freq: Two times a day (BID) | ORAL | 0 refills | Status: AC

## 2022-07-13 NOTE — Patient Instructions (Addendum)
_______________________________________________________  If your blood pressure at your visit was 140/90 or greater, please contact your primary care physician to follow up on this. _______________________________________________________  If you are age 83 or older, your body mass index should be between 23-30. Your Body mass index is 25.19 kg/m. If this is out of the aforementioned range listed, please consider follow up with your Primary Care Provider. ________________________________________________________  The [place] [facility] providers would like to encourage you to use [facility] to communicate with providers for non-urgent requests or questions.  Due to long hold times on the telephone, sending your provider a message by [facility] may be a faster and more efficient way to get a response.  Please allow 48 business hours for a response.  Please remember that this is for non-urgent requests.  _______________________________________________________   Your provider has requested that you go to the basement level for lab work before leaving today. Press "B" on the elevator. The lab is located at the first door on the left as you exit the elevator.  Due to recent changes in healthcare laws, you may see the results of your imaging and laboratory studies on MyChart before your provider has had a chance to review them.  We understand that in some cases there may be results that are confusing or concerning to you. Not all laboratory results come back in the same time frame and the provider may be waiting for multiple results in order to interpret others.  Please give [place] 48 hours in order for your provider to thoroughly review all the results before contacting the office for clarification of your results.    We have sent the following medications to your pharmacy for you to pick up at your convenience:  START: Augmentin 875mg  take 1 tablet twice daily with food for 10 days.  TAKE Miralax 17 gm in 8 ounces of  water daily as needed.  CONTINUE: omeprazole 20mg  one capsule daily.  Please call and speak to Amy's nurse if not better after antibiotics.   Thank you for entrusting me with your care and choosing [facility].  Amy Esterwood, PA-C

## 2022-07-13 NOTE — Progress Notes (Signed)
Subjective:    Patient ID: Anne Warren, female    DOB: Apr 30, 1940, 82 y.o.   MRN: [id number]  HPI  Anne Warren is a pleasant 82 year old Hispanic female, established with Dr. Marina Goodell.  She was last seen in July 2022 by myself.  She comes in today with primary complaint of left lower quadrant pain.   She has history of diverticulosis and diverticulitis.  Had been evaluated for complaints of dysphagia at the time of her last office visit and had undergone a barium swallow which was entirely normal.  This was felt to be likely globus versus a functional dysphagia and she was very reassured after negative imaging. She last underwent colonoscopy in October 2020 with 4 small polyps removed all 1 to 4 mm in size, also noted pandiverticulosis.  Path on the polyps showed 2 tubular adenomas, 1 inflammatory polyp and 1 benign mucosa and no follow-up recommended due to age (82). She last had pelvic ultrasound in July 2022 and it also had CT of the abdomen and pelvis in June 2022 with evidence of pandiverticulosis, and a punctate stone up in the left kidney.  Also noted stable hepatomegaly with liver of 18 cm.  Patient says she has been having left lower quadrant pain 82 off and on for several weeks but more bothersome over the past couple of weeks.  She says it feels like her prior diverticulitis.  She has been worried about this.  She has not had any fever or chills and overall feels okay.  Appetite has been okay though perhaps not eating as much as usual.  No nausea or vomiting.  Bowel movements fairly regular, occasional diarrhea, no melena or hematochezia.  She has not been using Benefiber which had been advised previously because she feels like this worsens constipation issues.  She occasionally will use MiraLAX.Marland Kitchen   Review of Systems Pertinent positive and negative review of systems were noted in the above HPI section.  All other review of systems was otherwise negative.   Outpatient Encounter Medications as of  07/13/2022  Medication Sig   albuterol (PROVENTIL) (2.5 MG/3ML) 0.083% nebulizer solution Take 3 mLs (2.5 mg total) by nebulization every 4 (four) hours as needed for wheezing or shortness of breath.   albuterol (VENTOLIN HFA) 108 (90 Base) MCG/ACT inhaler Inhale 2 puffs into the lungs every 4 (four) hours as needed for wheezing or shortness of breath.   amoxicillin (AMOXIL) 875 MG tablet Take 1 tablet (875 mg total) by mouth 2 (two) times daily for 10 days.   Ascorbic Acid (VITAMIN C) 1000 MG tablet Take 1,000 mg by mouth daily.   Benralizumab (FASENRA) 30 MG/ML SOSY Inject 1 mL (30 mg total) into the skin every 8 (eight) weeks.   BINAXNOW COVID-19 AG HOME TEST KIT    cetirizine (ZYRTEC) 10 MG tablet Take 1 tablet (10 mg total) by mouth daily as needed for allergies (Can take an extra dose during flare ups.).   clopidogrel (PLAVIX) 75 MG tablet Take 1 tablet (75 mg total) by mouth daily.   Multiple Vitamins-Minerals (MULTIVITAMIN WOMEN PO) Take 1 tablet by mouth daily.   omeprazole (PRILOSEC) 20 MG capsule Take 1 capsule (20 mg total) by mouth daily.   rosuvastatin (CRESTOR) 20 MG tablet Take 1 tablet (20 mg total) by mouth daily.   Facility-Administered Encounter Medications as of 07/13/2022  Medication   Benralizumab SOSY 30 mg   Allergies  Allergen Reactions   Aspirin Hives    Reports gastric ulcers with [id number].  Lipitor [Atorvastatin] Other (See Comments)    REACTION: Losing memory   Zocor [Simvastatin] Other (See Comments)    REACTION: Losing memory   Amitriptyline Hcl Other (See Comments)    REACTION: Felt bad when on this and HCTZ   Fish Oil Nausea And Vomiting and Other (See Comments)    REACTION: and dizziness   Patient Active Problem List   Diagnosis Date Noted   Peripheral vascular disease of extremity with claudication (HCC) 03/01/2021   Left lower quadrant pain 03/24/2020   Transaminitis 03/24/2020   Patient is Jehovah's Witness 06/23/2019   Asthma, intermittent  06/23/2019   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   Oropharyngeal dysphagia 04/10/2019   Atherosclerosis of native arteries of extremity with intermittent claudication (HCC) 11/19/2018   Axillary fullness 10/02/2018   ALLERGIC RHINITIS, SEASONAL 11/09/2008   URINARY INCONTINENCE, STRESS 06/01/2008   GERD 11/14/2007   Osteoarthritis 12/18/2006   TENSION HEADACHES, CHRONIC 11/27/2006   DECREASED HEARING 11/27/2006   Essential hypertension 11/27/2006   Social History   Socioeconomic History   Marital status: Married    Spouse name: Chief Technology Officer   Number of children: 1   Years of education: 8   Highest education level: Not on file  Occupational History   Occupation: Retired- Retail banker: UNEMPLOYED  Tobacco Use   Smoking status: Former    Packs/day: .1    Types: Cigarettes    Quit date: 01/30/1978    Years since quitting: 44.4   Smokeless tobacco: Never   Tobacco comments:    1 cig daily <1 year 12/11/14  Vaping Use   Vaping Use: Never used  Substance and Sexual Activity   Alcohol use: Never    Alcohol/week: 0.0 standard drinks of alcohol   Drug use: No   Sexual activity: Yes  Other Topics Concern   Not on file  Social History Narrative   Native of [place]   Married to [place], native of [place], visits children of his first marriage there.   They have a daughter, who is a Clinical research associate in [place] area   Religion, [facility] Witness      Health Care POA: Primary: sister Elwin Mocha [phone] Secondary: Milderd Meager [phone]   Emergency Contact: Husband [name] [phone]   End of Life Plan: DNR     Who lives with you: Lives in 1 story home with husband.   Any pets: 0   Diet: Patient has a varied diet of protein, starch, and vegetables.  Patient tries not to eat red meat.    Exercise: Patient walks 2-3 times per week.  Pt has had problems with her asthma while exercising.    Seatbelts: Patient reports wearing seat belts  when in vehicle.    Wynelle Link Exposure/Protection: Patient reports wearing sun protection.   Hobbies: Likes to read   Continues to drive and does not use any assistance devices. Is independent of all ADL's   Education: 9th grade, is able to read spanish and about 50% [place]             Social Determinants of Health   Financial Resource Strain: Not on file  Food Insecurity: No Food Insecurity (06/18/2019)   Hunger Vital Sign    Worried About Running Out of Food in the Last Year: Never true    Ran Out of Food in the Last Year: Never true  Transportation Needs: No Transportation Needs (06/18/2019)   PRAPARE - Transportation    Lack of  Transportation (Medical): No    Lack of Transportation (Non-Medical): No  Physical Activity: Not on file  Stress: Not on file  Social Connections: Not on file  Intimate Partner Violence: Not on file    Anne Warren's family history includes Breast cancer in her sister; Cancer in her sister and sister; Depression in her sister; Heart attack in her father and mother; Heart disease in her mother; Stroke in her brother and father.      Objective:    Vitals:   07/13/22 1445  BP: 130/68  Pulse: 78    Physical Exam Well-developed well-nourished elderly non-English-speaking female in no acute distress.  Accompanied by her husband and an interpreter height, Weight, 129 BMI 25.1  HEENT; nontraumatic normocephalic, EOMI, PE R LA, sclera anicteric. Oropharynx; not examined today Neck; supple, no JVD Cardiovascular; regular rate and rhythm with S1-S2, no murmur rub or gallop Pulmonary; Clear bilaterally Abdomen; soft,  nondistended,, she is tender in the left lower quadrant, no guarding or rebound, there is very mild tenderness in the suprapubic area, no palpable mass or hepatosplenomegaly, bowel sounds are active Rectal; not done today Skin; benign exam, no jaundice rash or appreciable lesions Extremities; no clubbing cyanosis or edema skin warm and  dry Neuro/Psych; alert and oriented x4, grossly nonfocal mood and affect appropriate        Assessment & Plan:   #10 82 year old Hispanic female with history of pandiverticulosis and prior episodes of diverticulitis, who comes in currently with several week history of intermittent left lower quadrant discomfort somewhat progressive over the past 2 weeks.  No fever or chills, appetite fine, no changes in bowel habits and no melena or hematochezia.  I think she does have mild diverticulitis.  #2 history of adenomatous colon polyps-no follow-up colonoscopies planned due to age, last exam October 2020  #3, GERD-stable on low-dose omeprazole 20 mg daily. #4 mild cognitive dysfunction and mild sensory neural hearing loss #5.  Hypertension #6.  Peripheral vascular disease #7.  Asthma #8 Jehovah's Witness/ no blood products  Plan; will start Augmentin 875 1 p.o. twice daily x 10 days to be taken with food CBC and be met today Advised MiraLAX 17 g in 8 ounces of water as needed to avoid constipation Continue omeprazole 20 mg p.o. every morning AC  I have asked her to call back if she has worsening of symptoms or if her symptoms do not resolve with completion of antibiotics.  Advised to call and asked to speak to my nurse. At that point may need repeat imaging, and/or trial of an antispasmodic.   Winton Offord S Caliegh Middlekauff PA-C 07/13/2022   Cc: Carney Living, *

## 2022-07-14 NOTE — Progress Notes (Signed)
Noted  

## 2022-07-25 ENCOUNTER — Ambulatory Visit: Payer: Medicare Other | Admitting: Allergy and Immunology

## 2022-08-01 ENCOUNTER — Ambulatory Visit: Payer: Medicare Other | Admitting: Allergy and Immunology

## 2022-08-01 ENCOUNTER — Ambulatory Visit: Payer: Medicare Other

## 2022-08-01 VITALS — BP 140/82 | HR 88 | Temp 98.4°F | Resp 16 | Ht 60.0 in | Wt 131.1 lb

## 2022-08-01 DIAGNOSIS — J3089 Other allergic rhinitis: Secondary | ICD-10-CM | POA: Diagnosis not present

## 2022-08-01 DIAGNOSIS — R1310 Dysphagia, unspecified: Secondary | ICD-10-CM

## 2022-08-01 DIAGNOSIS — K219 Gastro-esophageal reflux disease without esophagitis: Secondary | ICD-10-CM | POA: Diagnosis not present

## 2022-08-01 DIAGNOSIS — J4550 Severe persistent asthma, uncomplicated: Secondary | ICD-10-CM | POA: Diagnosis not present

## 2022-08-01 MED ORDER — ALBUTEROL SULFATE (2.5 MG/3ML) 0.083% IN NEBU: 2.5000 mg | INHALATION_SOLUTION | RESPIRATORY_TRACT | 1 refills | Status: DC | PRN

## 2022-08-01 MED ORDER — PANTOPRAZOLE SODIUM 40 MG PO TBEC: 40.0000 mg | DELAYED_RELEASE_TABLET | Freq: Every morning | ORAL | 1 refills | Status: DC

## 2022-08-01 MED ORDER — ALBUTEROL SULFATE HFA 108 (90 BASE) MCG/ACT IN AERS: 2.0000 | INHALATION_SPRAY | RESPIRATORY_TRACT | 1 refills | Status: DC | PRN
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
## 2022-08-01 NOTE — Patient Instructions (Signed)
  1. Continue Benralizumab injections    2. If needed:   A.  albuterol nebulization or ProAir HFA 2 puffs every 4-6 hours  B.  OTC cetirizine 10 mg tablet 1 time per day  3. Can treat reflux / swallow issue:   A. Pantoprazole 40 mg - 1 tablet 1 time per day  B. OTC antacids if needed  4.  Plan for fall flu vaccine  5. Return to clinic in 6 months or earlier if problem

## 2022-08-02 ENCOUNTER — Encounter: Payer: Self-pay | Admitting: Allergy and Immunology

## 2022-09-26 ENCOUNTER — Ambulatory Visit: Payer: Medicare Other | Admitting: *Deleted

## 2022-09-26 DIAGNOSIS — J4550 Severe persistent asthma, uncomplicated: Secondary | ICD-10-CM | POA: Diagnosis not present

## 2022-10-24 ENCOUNTER — Ambulatory Visit: Payer: Medicare Other | Admitting: Family Medicine

## 2022-10-24 ENCOUNTER — Other Ambulatory Visit: Payer: Self-pay

## 2022-10-24 ENCOUNTER — Encounter: Payer: Self-pay | Admitting: Family Medicine

## 2022-10-24 VITALS — BP 139/69 | HR 74 | Ht 60.0 in | Wt 126.8 lb

## 2022-10-24 DIAGNOSIS — Z23 Encounter for immunization: Secondary | ICD-10-CM | POA: Diagnosis not present

## 2022-10-24 DIAGNOSIS — I739 Peripheral vascular disease, unspecified: Secondary | ICD-10-CM

## 2022-10-24 DIAGNOSIS — I70213 Atherosclerosis of native arteries of extremities with intermittent claudication, bilateral legs: Secondary | ICD-10-CM

## 2022-10-24 DIAGNOSIS — I10 Essential (primary) hypertension: Secondary | ICD-10-CM | POA: Diagnosis not present

## 2022-10-24 DIAGNOSIS — R7401 Elevation of levels of liver transaminase levels: Secondary | ICD-10-CM | POA: Diagnosis not present

## 2022-10-24 NOTE — Progress Notes (Signed)
    SUBJECTIVE:   CHIEF COMPLAINT / HPI:   Hypertension Unsure if she is taking any medications.  Peripheral Arterial Vascular Disease Thinks she is taking a cholesterol medication but does not know name and did not bring in her medications.  Complains of pain when she walks.   Dizziness Sometimes feel lightheadness when stands and walks but not all the time No chest pain or shortness of breath.  Her prior fatigue is overall better.  Exercising some.   She agree should do more.  Her husband thinks their insurance will pay for a gym   Seeing Allergy Asthma,and GI and for varicose veins     OBJECTIVE:   BP 139/69   Pulse 74   Ht 5' (1.524 m)   Wt 126 lb 12.8 oz (57.5 kg)   SpO2 93%   BMI 24.76 kg/m   Alert no distres Heart - Regular rate and rhythm.  No murmurs, gallops or rubs.    Lungs:  Normal respiratory effort, chest expands symmetrically. Lungs are clear to auscultation, no crackles or wheezes. No pedal edema Able to stand and walk to door turn and return at normal pace without any evident instability Able to get on exam table without assistance or difficulty When standing on toes or doing deep knee bend has immediate pain on movement   ASSESSMENT/PLAN:   Essential hypertension Assessment & Plan: Controlled today.  Unsure if taking any blood pressure medications. Asked to bring in all her medications.   Orders: -     CMP14+EGFR -     CBC  Peripheral vascular disease of extremity with claudication (HCC) -     Lipid panel  Encounter for immunization -     Pfizer Comirnaty Covid-19 Vaccine 32yrs & older -     Flu Vaccine Trivalent High Dose (Fluad)  Transaminitis Assessment & Plan: Will check CMET today    Atherosclerosis of native artery of both lower extremities with intermittent claudication (HCC) Assessment & Plan: Has pain but on exam seems to be more musculoskeletal  (immediate with movement) than vascular but may have both.  Unclear if she is  taking a statin currently.  Will check labs and asked to bring in all her medications.  Apparently had been on asa and pletal in past? Encouraged a gradual walking program.        Patient Instructions  Good to see you today - Thank you for coming in  Things we discussed today:  I will call you if your lab tests are not normal.  Otherwise we will discuss them at your next visit.  Would exercise - start slowly but increase to at least 30 minutes every day   Please always bring your medication bottles  Come back to see me in 3-4 weeks    Carney Living, MD [facility]

## 2022-10-24 NOTE — Assessment & Plan Note (Signed)
Controlled today.  Unsure if taking any blood pressure medications. Asked to bring in all her medications.

## 2022-10-24 NOTE — Patient Instructions (Signed)
Good to see you today - Thank you for coming in  Things we discussed today:  I will call you if your lab tests are not normal.  Otherwise we will discuss them at your next visit.  Would exercise - start slowly but increase to at least 30 minutes every day   Please always bring your medication bottles  Come back to see me in 3-4 weeks

## 2022-10-24 NOTE — Assessment & Plan Note (Addendum)
Has pain but on exam seems to be more musculoskeletal  (immediate with movement) than vascular but may have both.  Unclear if she is taking a statin currently.  Will check labs and asked to bring in all her medications.  Apparently had been on asa and pletal in past? Encouraged a gradual walking program.

## 2022-10-24 NOTE — Assessment & Plan Note (Signed)
Will check CMET today

## 2022-10-25 LAB — LIPID PANEL
Chol/HDL Ratio: 5.1 ratio — ABNORMAL HIGH (ref 0.0–4.4)
Cholesterol, Total: 202 mg/dL — ABNORMAL HIGH (ref 100–199)
HDL: 40 mg/dL (ref >39)
LDL Chol Calc (NIH): 123 mg/dL — ABNORMAL HIGH (ref 0–99)
Triglycerides: 218 mg/dL — ABNORMAL HIGH (ref 0–149)
VLDL Cholesterol Cal: 39 mg/dL (ref 5–40)

## 2022-10-25 LAB — CBC
Hematocrit: 45.2 % (ref 34.0–46.6)
Hemoglobin: 14.6 g/dL (ref 11.1–15.9)
MCH: 29.1 pg (ref 26.6–33.0)
MCHC: 32.3 g/dL (ref 31.5–35.7)
MCV: 90 fL (ref 79–97)
Platelets: 232 10*3/uL (ref 150–450)
RBC: 5.01 x10E6/uL (ref 3.77–5.28)
RDW: 12.6 % (ref 11.7–15.4)
WBC: 6.1 10*3/uL (ref 3.4–10.8)

## 2022-10-25 LAB — CMP14+EGFR
ALT: 44 IU/L — ABNORMAL HIGH (ref 0–32)
AST: 41 IU/L — ABNORMAL HIGH (ref 0–40)
Albumin: 4.5 g/dL (ref 3.7–4.7)
Alkaline Phosphatase: 95 IU/L (ref 44–121)
BUN/Creatinine Ratio: 17 (ref 12–28)
BUN: 15 mg/dL (ref 8–27)
Bilirubin Total: 0.5 mg/dL (ref 0.0–1.2)
CO2: 22 mmol/L (ref 20–29)
Calcium: 9.9 mg/dL (ref 8.7–10.3)
Chloride: 106 mmol/L (ref 96–106)
Creatinine, Ser: 0.86 mg/dL (ref 0.57–1.00)
Globulin, Total: 2.6 g/dL (ref 1.5–4.5)
Glucose: 104 mg/dL — ABNORMAL HIGH (ref 70–99)
Potassium: 4.4 mmol/L (ref 3.5–5.2)
Sodium: 144 mmol/L (ref 134–144)
Total Protein: 7.1 g/dL (ref 6.0–8.5)
eGFR: 67 mL/min/{1.73_m2} (ref >59)

## 2022-11-21 ENCOUNTER — Ambulatory Visit (INDEPENDENT_AMBULATORY_CARE_PROVIDER_SITE_OTHER): Payer: Medicare Other | Admitting: *Deleted

## 2022-11-21 DIAGNOSIS — J4550 Severe persistent asthma, uncomplicated: Secondary | ICD-10-CM

## 2022-11-22 ENCOUNTER — Encounter: Payer: Self-pay | Admitting: Family Medicine

## 2022-11-22 ENCOUNTER — Ambulatory Visit: Payer: Medicare Other | Admitting: Family Medicine

## 2022-11-22 ENCOUNTER — Other Ambulatory Visit: Payer: Self-pay

## 2022-11-22 VITALS — BP 155/75 | HR 82 | Ht 60.0 in | Wt 130.6 lb

## 2022-11-22 DIAGNOSIS — I70213 Atherosclerosis of native arteries of extremities with intermittent claudication, bilateral legs: Secondary | ICD-10-CM

## 2022-11-22 DIAGNOSIS — J4520 Mild intermittent asthma, uncomplicated: Secondary | ICD-10-CM | POA: Diagnosis not present

## 2022-11-22 DIAGNOSIS — I10 Essential (primary) hypertension: Secondary | ICD-10-CM | POA: Diagnosis not present

## 2022-11-22 MED ORDER — ATORVASTATIN CALCIUM 40 MG PO TABS
40.0000 mg | ORAL_TABLET | Freq: Every day | ORAL | 3 refills | Status: DC
Start: 2022-11-22 — End: 2023-01-17

## 2022-11-22 NOTE — Progress Notes (Signed)
    SUBJECTIVE:   CHIEF COMPLAINT / HPI:   Cholesterol Did not bring in her medications (forgot them on the table)  She and husband think might have had some memory issues when took statins before but they are unsure.  Listed as intolerances for lipitor and zocor.  Had been prescribed rosuvastatin in past.  PAD She has pain in R lower leg when she walks fast other wise can walk good distance in grocery store according to her and husband   OBJECTIVE:   BP (!) 155/75   Pulse 82   Ht 5' (1.524 m)   Wt 130 lb 9.6 oz (59.2 kg)   SpO2 99%   BMI 25.51 kg/m   Heart - Regular rate and rhythm.  No murmurs, gallops or rubs.    Feet - no lesions  ASSESSMENT/PLAN:   Atherosclerosis of native artery of both lower extremities with intermittent claudication (HCC) Assessment & Plan: Given her PAD and elevated cholesterol would definitely benefit from a statin.  Discussed with her and husband.  We decided to try atorvastatin and monitor for side effects.  They will bring in all her medications next visit   Orders: -     Atorvastatin Calcium; Take 1 tablet (40 mg total) by mouth daily.  Dispense: 90 tablet; Refill: 3  Intermittent asthma, unspecified asthma severity, unspecified whether complicated  Essential hypertension Assessment & Plan: Elevated today but I am unsure what if anything she is taking at home.  Asked to bring in all her medications and her wrist cuff next visit       Patient Instructions  Good to see you today - Thank you for coming in  Things we discussed today:  Cholesterol Take the atorvastatin 40 mg once a day  If any side effects let me know  Walking Try to walk a little further every week   Please always bring your medication bottles  Come back to see me in when you husband has an appointment     Carney Living, MD [facility]

## 2022-11-22 NOTE — Assessment & Plan Note (Signed)
Given her PAD and elevated cholesterol would definitely benefit from a statin.  Discussed with her and husband.  We decided to try atorvastatin and monitor for side effects.  They will bring in all her medications next visit

## 2022-11-22 NOTE — Assessment & Plan Note (Signed)
Elevated today but I am unsure what if anything she is taking at home.  Asked to bring in all her medications and her wrist cuff next visit

## 2022-11-22 NOTE — Patient Instructions (Signed)
Good to see you today - Thank you for coming in  Things we discussed today:  Cholesterol Take the atorvastatin 40 mg once a day  If any side effects let me know  Walking Try to walk a little further every week   Please always bring your medication bottles  Come back to see me in when you husband has an appointment

## 2023-01-16 ENCOUNTER — Ambulatory Visit: Payer: Medicare Other | Admitting: *Deleted

## 2023-01-16 DIAGNOSIS — J4550 Severe persistent asthma, uncomplicated: Secondary | ICD-10-CM | POA: Diagnosis not present

## 2023-01-17 ENCOUNTER — Ambulatory Visit (INDEPENDENT_AMBULATORY_CARE_PROVIDER_SITE_OTHER): Payer: Medicare Other | Admitting: Family Medicine

## 2023-01-17 ENCOUNTER — Encounter: Payer: Self-pay | Admitting: Family Medicine

## 2023-01-17 VITALS — BP 130/71 | HR 86 | Ht 60.0 in | Wt 130.4 lb

## 2023-01-17 DIAGNOSIS — K219 Gastro-esophageal reflux disease without esophagitis: Secondary | ICD-10-CM | POA: Diagnosis not present

## 2023-01-17 DIAGNOSIS — I10 Essential (primary) hypertension: Secondary | ICD-10-CM | POA: Diagnosis not present

## 2023-01-17 DIAGNOSIS — R7401 Elevation of levels of liver transaminase levels: Secondary | ICD-10-CM

## 2023-01-17 DIAGNOSIS — E785 Hyperlipidemia, unspecified: Secondary | ICD-10-CM | POA: Diagnosis not present

## 2023-01-17 MED ORDER — EZETIMIBE 10 MG PO TABS
10.0000 mg | ORAL_TABLET | Freq: Every day | ORAL | 3 refills | Status: DC
Start: 2023-01-17 — End: 2023-09-21

## 2023-01-17 NOTE — Assessment & Plan Note (Signed)
Will not take a statin as she feels it caused mental changes when she has taken several before.  Has not taken zetia.  Will start this and monitor her lipids.  Unlikely to get to PAD goal but may help.

## 2023-01-17 NOTE — Patient Instructions (Addendum)
Good to see you today - Thank you for coming in  Things we discussed today:  Cholesterol Take Zetia tablet one every day Come back in 3 months for a cholesterol check  Liver Blood tests Are very slightly high We will recheck today and check blood tests for hepatitis Come back in 3 months to discuss an ultrasound test  Stomach Take the Famotidine tablet every day   Continue to walk every day  You need a mammogram to prevent breast cancer.  Please schedule an appointment.  You can call [phone].     Come back to see you new doctor in 3 months   I will miss working with you - Be Well

## 2023-01-17 NOTE — Assessment & Plan Note (Addendum)
Mild asymptomatic.  She would like blood work to further evaluate.  Will repeat and check hepatitis panel.  Was hep C negative in past.    Repeat labs show continued mild LFT elevation.  Hepatitis labs are normal.  Sent her a letter to come back in 3 months to recheck.  Consider further work up for iron overload and repeat US vs watchful waiting.

## 2023-01-17 NOTE — Progress Notes (Signed)
    SUBJECTIVE:   CHIEF COMPLAINT / HPI:   Accompanied by her husband  Cholesterol Brings in her medications.  Is not taking a statin.  Is very reluctant to retry any statin   PAD She feels she is waking fairly well without pain.  Is not limiting her activities  LFTs No RUQ pain or jaundiceUS in 2022 showed mild steatosis. BMI is 25   Epigastric pain Had bottles for famotidine and omeprazole.  Feels famotidine helps the most. No bleeding or dysphagia   GOC Gave Advanced directives packet to complete OBJECTIVE:   BP 130/71   Pulse 86   Ht 5' (1.524 m)   Wt 130 lb 6.4 oz (59.1 kg)   SpO2 99%   BMI 25.47 kg/m   Heart - Regular rate and rhythm.  No murmurs, gallops or rubs.    Lungs:  Normal respiratory effort, chest expands symmetrically. Lungs are clear to auscultation, no crackles or wheezes. Abdomen - mild epigastric pain.  No HSM Able to stand an walk normally around room   ASSESSMENT/PLAN:   Transaminitis Assessment & Plan: Mild asymptomatic.  She would like blood work to further evaluate.  Will repeat and check hepatitis panel.  Was hep C negative in past.    Orders: -     Hepatic function panel -     HAV, HBV Panel  Hyperlipidemia, unspecified hyperlipidemia type Assessment & Plan: Will not take a statin as she feels it caused mental changes when she has taken several before.  Has not taken zetia.  Will start this and monitor her lipids.  Unlikely to get to PAD goal but may help.    Orders: -     Ezetimibe; Take 1 tablet (10 mg total) by mouth daily.  Dispense: 90 tablet; Refill: 3  Essential hypertension Assessment & Plan: Controlled without medications.  Monitor    Gastroesophageal reflux disease, unspecified whether esophagitis present Assessment & Plan: Will treat with famotidine and stop PPI.  Monitor her symptoms.  Has no red flags on exam or history       Patient Instructions  Good to see you today - Thank you for coming in  Things we  discussed today:  Cholesterol Take Zetia tablet one every day Come back in 3 months for a cholesterol check  Liver Blood tests Are very slightly high We will recheck today and check blood tests for hepatitis Come back in 3 months to discuss an ultrasound test  Stomach Take the Famotidine tablet every day   Continue to walk every day  You need a mammogram to prevent breast cancer.  Please schedule an appointment.  You can call [phone].     Come back to see you new doctor in 3 months   I will miss working with you - Be Well    Carney Living, MD [facility]

## 2023-01-17 NOTE — Assessment & Plan Note (Signed)
Will treat with famotidine and stop PPI.  Monitor her symptoms.  Has no red flags on exam or history

## 2023-01-17 NOTE — Assessment & Plan Note (Signed)
Controlled without medications.  Monitor

## 2023-01-19 ENCOUNTER — Encounter: Payer: Self-pay | Admitting: Family Medicine

## 2023-01-20 LAB — HEPATIC FUNCTION PANEL
ALT: 56 [IU]/L — ABNORMAL HIGH (ref 0–32)
AST: 55 [IU]/L — ABNORMAL HIGH (ref 0–40)
Albumin: 4.4 g/dL (ref 3.7–4.7)
Alkaline Phosphatase: 94 [IU]/L (ref 44–121)
Bilirubin Total: 0.6 mg/dL (ref 0.0–1.2)
Bilirubin, Direct: 0.2 mg/dL (ref 0.00–0.40)
Total Protein: 7.1 g/dL (ref 6.0–8.5)

## 2023-01-20 LAB — HEPATITIS A ANTIBODY, IGM: Hep A IgM: NEGATIVE

## 2023-01-20 LAB — HAV, HBV PANEL
Hep B Core Total Ab: NEGATIVE
Hep B Surface Ab, Qual: NONREACTIVE
Hepatitis B Surface Ag: NEGATIVE
hep A Total Ab: POSITIVE — AB

## 2023-02-01 ENCOUNTER — Other Ambulatory Visit: Payer: Self-pay | Admitting: *Deleted

## 2023-02-01 MED ORDER — FASENRA 30 MG/ML ~~LOC~~ SOSY: 30.0000 mg | PREFILLED_SYRINGE | SUBCUTANEOUS | 6 refills | Status: DC

## 2023-02-05 ENCOUNTER — Telehealth: Payer: Self-pay | Admitting: Family Medicine

## 2023-02-05 NOTE — Telephone Encounter (Signed)
 Spoke with her and her husband.  They got the letter about coming back for LFTs in 3 months and she is taking zetia without problems

## 2023-02-27 ENCOUNTER — Ambulatory Visit: Payer: Medicare Other | Admitting: Allergy and Immunology

## 2023-02-27 ENCOUNTER — Telehealth: Payer: Self-pay

## 2023-02-27 ENCOUNTER — Other Ambulatory Visit: Payer: Self-pay

## 2023-02-27 ENCOUNTER — Encounter: Payer: Self-pay | Admitting: Allergy and Immunology

## 2023-02-27 VITALS — BP 136/84 | HR 76 | Temp 98.4°F | Resp 18 | Wt 131.3 lb

## 2023-02-27 DIAGNOSIS — K219 Gastro-esophageal reflux disease without esophagitis: Secondary | ICD-10-CM

## 2023-02-27 DIAGNOSIS — J3089 Other allergic rhinitis: Secondary | ICD-10-CM

## 2023-02-27 DIAGNOSIS — J4550 Severe persistent asthma, uncomplicated: Secondary | ICD-10-CM | POA: Diagnosis not present

## 2023-02-27 MED ORDER — PANTOPRAZOLE SODIUM 40 MG PO TBEC: 40.0000 mg | DELAYED_RELEASE_TABLET | Freq: Two times a day (BID) | ORAL | 5 refills | Status: DC

## 2023-02-27 MED ORDER — ALBUTEROL SULFATE HFA 108 (90 BASE) MCG/ACT IN AERS: 2.0000 | INHALATION_SPRAY | RESPIRATORY_TRACT | 1 refills | Status: DC | PRN
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
## 2023-02-27 NOTE — Patient Instructions (Addendum)
  1. Discontinue Benralizumab injections    2. If needed:   A.  albuterol nebulization or ProAir HFA 2 puffs every 4-6 hours  B.  OTC cetirizine 10 mg tablet 1 time per day  3. Can treat reflux / swallow issue:   A. Pantoprazole 40 mg - 1 tablet 1-2 times per day  B. OTC antacids if needed  4. Influenza = Tamiflu. Covid = Paxlovid  5. Return to clinic in 6 months or earlier if problem

## 2023-02-27 NOTE — Telephone Encounter (Signed)
Per Dr. Lucie Leather patient is to discontinue Benralizumab injections.

## 2023-02-28 ENCOUNTER — Encounter: Payer: Self-pay | Admitting: Allergy and Immunology

## 2023-02-28 NOTE — Telephone Encounter (Signed)
noted

## 2023-03-13 ENCOUNTER — Ambulatory Visit: Payer: Medicare Other

## 2023-03-13 ENCOUNTER — Ambulatory Visit: Payer: Medicare Other | Admitting: *Deleted

## 2023-03-13 DIAGNOSIS — J4550 Severe persistent asthma, uncomplicated: Secondary | ICD-10-CM | POA: Diagnosis not present

## 2023-04-16 DIAGNOSIS — H353132 Nonexudative age-related macular degeneration, bilateral, intermediate dry stage: Secondary | ICD-10-CM | POA: Diagnosis not present

## 2023-04-23 ENCOUNTER — Ambulatory Visit: Payer: Self-pay | Admitting: Family Medicine

## 2023-05-01 ENCOUNTER — Ambulatory Visit: Admitting: Family Medicine

## 2023-05-01 ENCOUNTER — Encounter: Payer: Self-pay | Admitting: Family Medicine

## 2023-05-01 VITALS — BP 160/80 | HR 70 | Ht 60.0 in | Wt 130.0 lb

## 2023-05-01 DIAGNOSIS — I10 Essential (primary) hypertension: Secondary | ICD-10-CM

## 2023-05-01 DIAGNOSIS — Z13228 Encounter for screening for other metabolic disorders: Secondary | ICD-10-CM | POA: Diagnosis not present

## 2023-05-01 DIAGNOSIS — Z1231 Encounter for screening mammogram for malignant neoplasm of breast: Secondary | ICD-10-CM | POA: Diagnosis not present

## 2023-05-01 DIAGNOSIS — R7401 Elevation of levels of liver transaminase levels: Secondary | ICD-10-CM

## 2023-05-01 DIAGNOSIS — E785 Hyperlipidemia, unspecified: Secondary | ICD-10-CM | POA: Diagnosis not present

## 2023-05-01 DIAGNOSIS — I70213 Atherosclerosis of native arteries of extremities with intermittent claudication, bilateral legs: Secondary | ICD-10-CM | POA: Diagnosis not present

## 2023-05-01 LAB — POCT GLYCOSYLATED HEMOGLOBIN (HGB A1C): Hemoglobin A1C: 5.6 % (ref 4.0–5.6)

## 2023-05-01 MED ORDER — AMLODIPINE BESYLATE 2.5 MG PO TABS: 2.5000 mg | ORAL_TABLET | Freq: Every day | ORAL | 3 refills | Status: DC

## 2023-05-01 NOTE — Patient Instructions (Addendum)
 It was great to see you! Thank you for allowing me to participate in your care!  Our plans for today:  - Ill let you know the results of your labs. - Please go and get your mammogram.  - You should get a call in the next 2 weeks to schedule your appointment with vascular surgery.   Please arrive 15 minutes PRIOR to your next scheduled appointment time! If you do not, this affects OTHER patients' care.  Take care and seek immediate care sooner if you develop any concerns.   Anne Mans, MD, PGY-2 [facility] 1:27 PM 05/01/2023  [facility]

## 2023-05-01 NOTE — Assessment & Plan Note (Signed)
 Repeat LFTs today.  If continues to be elevated we will do right upper quadrant ultrasound as previous hepatitis panel was negative.

## 2023-05-01 NOTE — Assessment & Plan Note (Signed)
 Continue Zetia daily.  Discussed statin however still resistant to giving previous intolerance.  Will repeat lipid panel today.

## 2023-05-01 NOTE — Assessment & Plan Note (Signed)
 Blood pressure elevated x 2 today in clinic.  Consistent with mildly elevated home blood pressures.  Restart amlodipine 2.5 mg daily.  Follow-up 3 weeks.

## 2023-05-01 NOTE — Assessment & Plan Note (Signed)
 Lost to follow-up with Vascular Surgery. Repeat referral today for continued monitoring of ABIs.

## 2023-05-01 NOTE — Progress Notes (Cosign Needed Addendum)
    SUBJECTIVE:   CHIEF COMPLAINT / HPI:   PAD Still able to walk normally Going shopping and to supermarket with out issue Pain is minimal Would like to discuss cilastazol again. Per review of vascular note was supposed to follow up in 2023 for repeat ABIs.  HTN Concerned because high today. Used to take Amlodipine was stopped. At home 147/80s.  Transaminitis NO stomach pain No nausea Okay to recheck today    PERTINENT  PMH / PSH: HTN, PAD, GERD, Transaminitis, Asthma  OBJECTIVE:   BP (!) 160/80   Pulse 70   Ht 5' (1.524 m)   Wt 130 lb (59 kg)   SpO2 96%   BMI 25.39 kg/m   General: NAD, well appearing Neuro: A&O Cardiovascular: RRR, no murmurs, no peripheral edema Respiratory: normal WOB on RA, CTAB, no wheezes, ronchi or rales Abdomen: soft, NTTP, no rebound or guarding, no hepatomegaly Extremities: Moving all 4 extremities equally   ASSESSMENT/PLAN:   Assessment & Plan Transaminitis Repeat LFTs today.  If continues to be elevated we will do right upper quadrant ultrasound as previous hepatitis panel was negative. Hyperlipidemia, unspecified hyperlipidemia type Continue Zetia daily.  Discussed statin however still resistant to giving previous intolerance.  Will repeat lipid panel today. Atherosclerosis of native artery of both lower extremities with intermittent claudication (HCC) Lost to follow-up with Vascular Surgery. Repeat referral today for continued monitoring of ABIs. Encounter for screening mammogram for malignant neoplasm of breast Screening mammogram ordered. Screening for metabolic disorder Repeat A1c ordered. Essential hypertension Blood pressure elevated x 2 today in clinic.  Consistent with mildly elevated home blood pressures.  Restart amlodipine 2.5 mg daily.  Follow-up 3 weeks.  Future Appointments  Date Time Provider Department Center  05/21/2023  2:30 PM Celine Mans, MD [facility]  08/21/2023 10:50 AM Kozlow, Alvira Philips, MD  [facility] None     Celine Mans, MD [facility]

## 2023-05-01 NOTE — Addendum Note (Signed)
 Addended by: Jennette Bill on: 05/01/2023 02:47 PM   Modules accepted: Orders

## 2023-05-02 LAB — COMPREHENSIVE METABOLIC PANEL WITH GFR
ALT: 44 IU/L — ABNORMAL HIGH (ref 0–32)
AST: 45 IU/L — ABNORMAL HIGH (ref 0–40)
Albumin: 4.5 g/dL (ref 3.7–4.7)
Alkaline Phosphatase: 90 IU/L (ref 44–121)
BUN/Creatinine Ratio: 22 (ref 12–28)
BUN: 18 mg/dL (ref 8–27)
Bilirubin Total: 0.4 mg/dL (ref 0.0–1.2)
CO2: 24 mmol/L (ref 20–29)
Calcium: 10 mg/dL (ref 8.7–10.3)
Chloride: 103 mmol/L (ref 96–106)
Creatinine, Ser: 0.83 mg/dL (ref 0.57–1.00)
Globulin, Total: 2.4 g/dL (ref 1.5–4.5)
Glucose: 84 mg/dL (ref 70–99)
Potassium: 4.6 mmol/L (ref 3.5–5.2)
Sodium: 140 mmol/L (ref 134–144)
Total Protein: 6.9 g/dL (ref 6.0–8.5)
eGFR: 70 mL/min/{1.73_m2} (ref >59)

## 2023-05-02 LAB — LIPID PANEL
Chol/HDL Ratio: 4.9 ratio — ABNORMAL HIGH (ref 0.0–4.4)
Cholesterol, Total: 209 mg/dL — ABNORMAL HIGH (ref 100–199)
HDL: 43 mg/dL (ref >39)
LDL Chol Calc (NIH): 128 mg/dL — ABNORMAL HIGH (ref 0–99)
Triglycerides: 216 mg/dL — ABNORMAL HIGH (ref 0–149)
VLDL Cholesterol Cal: 38 mg/dL (ref 5–40)

## 2023-05-03 ENCOUNTER — Telehealth: Payer: Self-pay | Admitting: Family Medicine

## 2023-05-03 DIAGNOSIS — K760 Fatty (change of) liver, not elsewhere classified: Secondary | ICD-10-CM

## 2023-05-03 NOTE — Telephone Encounter (Signed)
 Called patient discussed recent lab results. Cholesterol still significantly elevated, discussed with the patient, continue Zetia.  Will rediscuss statin at visit on the 21st.  Mild transaminitis continues.  Per chart review has had noted hepatic steatosis 3 years ago on ultrasound.  This is likely the cause given negative hepatitis panel.  Discussed with patient we will repeat right upper quadrant ultrasound.

## 2023-05-07 ENCOUNTER — Telehealth: Payer: Self-pay

## 2023-05-07 NOTE — Telephone Encounter (Signed)
 Informed husband of patients Anne Warren at [facility] on April 21st at 8:00a. Patient is not to eat or drink after midnight. Patients husband understood.Aquilla Solian, CMA

## 2023-05-07 NOTE — Telephone Encounter (Signed)
 Informed husband of patients [place] on April 21st at 8:00a at [facility]. Aquilla Solian, CMA

## 2023-05-16 ENCOUNTER — Ambulatory Visit
Admission: RE | Admit: 2023-05-16 | Discharge: 2023-05-16 | Disposition: A | Discharge status: Home / Self Care | Source: Ambulatory Visit | Attending: Family Medicine | Admitting: Family Medicine

## 2023-05-16 DIAGNOSIS — Z1231 Encounter for screening mammogram for malignant neoplasm of breast: Secondary | ICD-10-CM | POA: Diagnosis not present

## 2023-05-21 ENCOUNTER — Ambulatory Visit (INDEPENDENT_AMBULATORY_CARE_PROVIDER_SITE_OTHER): Payer: Self-pay | Admitting: Family Medicine

## 2023-05-21 ENCOUNTER — Encounter: Payer: Self-pay | Admitting: Family Medicine

## 2023-05-21 ENCOUNTER — Ambulatory Visit (HOSPITAL_COMMUNITY)
Admission: RE | Admit: 2023-05-21 | Discharge: 2023-05-21 | Disposition: A | Discharge status: Home / Self Care | Source: Ambulatory Visit | Attending: Family Medicine | Admitting: Family Medicine

## 2023-05-21 VITALS — BP 121/73 | HR 68 | Ht 60.0 in | Wt 129.8 lb

## 2023-05-21 DIAGNOSIS — K838 Other specified diseases of biliary tract: Secondary | ICD-10-CM | POA: Diagnosis not present

## 2023-05-21 DIAGNOSIS — R7401 Elevation of levels of liver transaminase levels: Secondary | ICD-10-CM

## 2023-05-21 DIAGNOSIS — R7989 Other specified abnormal findings of blood chemistry: Secondary | ICD-10-CM | POA: Diagnosis not present

## 2023-05-21 DIAGNOSIS — I10 Essential (primary) hypertension: Secondary | ICD-10-CM | POA: Diagnosis not present

## 2023-05-21 DIAGNOSIS — K760 Fatty (change of) liver, not elsewhere classified: Secondary | ICD-10-CM | POA: Diagnosis not present

## 2023-05-21 DIAGNOSIS — Z9049 Acquired absence of other specified parts of digestive tract: Secondary | ICD-10-CM | POA: Diagnosis not present

## 2023-05-21 DIAGNOSIS — E785 Hyperlipidemia, unspecified: Secondary | ICD-10-CM | POA: Diagnosis not present

## 2023-05-21 MED ORDER — PRAVASTATIN SODIUM 20 MG PO TABS
20.0000 mg | ORAL_TABLET | Freq: Every day | ORAL | 0 refills | Status: DC
Start: 2023-05-21 — End: 2023-08-24

## 2023-05-21 NOTE — Patient Instructions (Addendum)
 It was great to see you! Thank you for allowing me to participate in your care!  Our plans for today:  - Please start taking Pravastatin  20mg  daily. This will help with your cholesterol. - Your blood pressure is doing very well, please continue taking your Amlodipine . - Your liver ultrasound was normal.   Please arrive 15 minutes PRIOR to your next scheduled appointment time! If you do not, this affects OTHER patients' care.  Take care and seek immediate care sooner if you develop any concerns.   Ivin Marrow, MD, PGY-2 [facility] 1:55 PM 05/21/2023  [facility]

## 2023-05-21 NOTE — Assessment & Plan Note (Signed)
 Patient agreeable to trying different statin today in addition to continuing Zetia .  Patient agreeable to starting 20 mg pravastatin  daily.

## 2023-05-21 NOTE — Assessment & Plan Note (Signed)
 BP controlled now on amlodipine  2.5 mg daily.  Recommended checking blood pressure every other day.  Continue amlodipine .

## 2023-05-21 NOTE — Progress Notes (Signed)
    SUBJECTIVE:   CHIEF COMPLAINT / HPI: BP f/u  Is taking Amlodipine  at home at home. Is not measuring BP at home. Does have a wrist cuff at home.  Husband providing translation per patient preference.  Reviewed RUQ US  with patient.  PERTINENT  PMH / PSH: Transaminitis, HTN  OBJECTIVE:   BP 121/73   Pulse 68   Ht 5' (1.524 m)   Wt 129 lb 12.8 oz (58.9 kg)   SpO2 97%   BMI 25.35 kg/m   General: NAD, well appearing Neuro: A&O Respiratory: normal WOB on RA Extremities: Moving all 4 extremities equally   ASSESSMENT/PLAN:   Assessment & Plan Transaminitis Right upper quadrant ultrasound with likely benign cyst.  Expected postcholecystectomy changes of biliary tract. Mild hepatic steatosis which likely explains her mild transaminitis.  Repeat CMP in 1 year. Essential hypertension BP controlled now on amlodipine  2.5 mg daily.  Recommended checking blood pressure every other day.  Continue amlodipine . Hyperlipidemia, unspecified hyperlipidemia type Patient agreeable to trying different statin today in addition to continuing Zetia .  Patient agreeable to starting 20 mg pravastatin  daily.  Return in about 3 months (around 08/20/2023).  Ivin Marrow, MD [facility]

## 2023-06-18 NOTE — Progress Notes (Signed)
  SUBJECTIVE:   CHIEF COMPLAINT / HPI:   Anne Warren is an 83 year old female with diverticulosis who presents with worsening abdominal pain and gastrointestinal symptoms.  She has experienced severe abdominal pain for four to five years, with increased intensity this week, particularly in the epigastric region. The pain was more intense yesterday and accompanied by swelling/bloating.  She experiences significant acid reflux, exacerbated by the lack of medication. Nausea and vomiting occurred yesterday, along with diarrhea that resolved by night. Symptoms worsen with fatty or greasy foods. No hematemesis, melena, or fever. Her gallbladder has been removed. She states she had endoscopy but unsure when.   Spanish interpreter utilized throughout encounter.  Husband assists with history.  PERTINENT  PMH / PSH: HTN, HLD, GERD, mild cognitive impairment, sensorineural hearing loss, cholecystectomy  OBJECTIVE:  BP 102/73   Pulse 63   Temp (!) 97.5 F (36.4 C) (Oral)   Wt 124 lb 6.4 oz (56.4 kg)   SpO2 100%   BMI 24.30 kg/m  General: Well-appearing, NAD Pulm: Normal WOB Abdomen: Soft abdomen, epigastric and left lower quadrant tenderness that is tolerable by patient, normoactive bowel sounds  ASSESSMENT/PLAN:   Assessment & Plan Epigastric pain Reassuring weight, no fever or blood notable on emesis or diarrhea which are infrequent for her.  I have reviewed prior imaging and lab work, do not feel it is high yield to repeat CMP for transaminitis.  She has normal biliary duct dilation postcholecystectomy.  CT A/P in 2022 revealed diverticulosis, colonoscopy with benign polyps.  I am unable to identify endoscopy records and given her concerns, would recommend she be evaluated by GI to receive endoscopy.  Refill Protonix  at this time to give relief, given she is consistently taking these, unable to test for H. pylori at this time. Return if symptoms worsen or fail to improve. Anne Goon,  DO 06/19/2023, 9:53 AM PGY-3, [facility]

## 2023-06-19 ENCOUNTER — Ambulatory Visit (INDEPENDENT_AMBULATORY_CARE_PROVIDER_SITE_OTHER): Admitting: Student

## 2023-06-19 VITALS — BP 102/73 | HR 63 | Temp 97.5°F | Wt 124.4 lb

## 2023-06-19 DIAGNOSIS — R1013 Epigastric pain: Secondary | ICD-10-CM

## 2023-06-19 MED ORDER — PANTOPRAZOLE SODIUM 40 MG PO TBEC: 40.0000 mg | DELAYED_RELEASE_TABLET | Freq: Two times a day (BID) | ORAL | 5 refills | Status: DC

## 2023-06-19 NOTE — Patient Instructions (Addendum)
 It was great to see you today! Thank you for choosing [facility] for your primary care.  Hoy abordamos: 1. Considero que sera mejor que lo viera un gastroenterlogo para que considere una endoscopia, ya que me preocupa que pueda tener una lcera gstrica. Tambin podra darle ms recomendaciones sobre el dolor en el cuadrante inferior izquierdo. Hemos obtenido las imgenes y los [place] de laboratorio adecuados y, en [place], se debera considerar una evaluacin visual con endoscopia por parte del Architect. Le he renovado la receta de pantoprazol para que lo tome [employer] al da para Armed forces operational officer. Si empieza a notar [employer] o dolor incontrolable, le recomiendo que acuda a urgencias.  Today we addressed: I feel that it would be best for you to be seen by a GI doctor for consideration of endoscopy as I am concerned he may have a gastric ulcer.  They may also be able to give you further recommendations on the left lower quadrant pain.  We have obtained the appropriate imaging and lab work on you thus far and at this time you should be considered for visual evaluation with endoscopy by GI.  I have refilled your pantoprazole  to take twice a day to give you relief of your epigastric pain.  Should you start to notice blood in your stool or uncontrolled pain, I do recommend you proceed to the ED.  If you haven't already, sign up for My Chart to have easy access to your labs results, and communication with your primary care physician.  Return if symptoms worsen or fail to improve. Please arrive 15 minutes before your appointment to ensure smooth check in process.  We appreciate your efforts in making this happen.  Thank you for allowing me to participate in your care, Anne Goon, DO 06/19/2023, 9:50 AM PGY-3, [facility]

## 2023-08-01 ENCOUNTER — Ambulatory Visit: Admitting: Internal Medicine
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
## 2023-08-02 ENCOUNTER — Encounter: Payer: Self-pay | Admitting: Physician Assistant

## 2023-08-02 ENCOUNTER — Ambulatory Visit: Payer: Self-pay | Admitting: Physician Assistant

## 2023-08-02 ENCOUNTER — Ambulatory Visit: Admitting: Physician Assistant

## 2023-08-02 ENCOUNTER — Other Ambulatory Visit

## 2023-08-02 VITALS — BP 112/64 | HR 78 | Ht 59.0 in | Wt 126.0 lb

## 2023-08-02 DIAGNOSIS — Z9049 Acquired absence of other specified parts of digestive tract: Secondary | ICD-10-CM | POA: Diagnosis not present

## 2023-08-02 DIAGNOSIS — R1032 Left lower quadrant pain: Secondary | ICD-10-CM | POA: Diagnosis not present

## 2023-08-02 DIAGNOSIS — K838 Other specified diseases of biliary tract: Secondary | ICD-10-CM | POA: Diagnosis not present

## 2023-08-02 DIAGNOSIS — R7989 Other specified abnormal findings of blood chemistry: Secondary | ICD-10-CM

## 2023-08-02 DIAGNOSIS — R1013 Epigastric pain: Secondary | ICD-10-CM | POA: Diagnosis not present

## 2023-08-02 DIAGNOSIS — R160 Hepatomegaly, not elsewhere classified: Secondary | ICD-10-CM | POA: Diagnosis not present

## 2023-08-02 DIAGNOSIS — K760 Fatty (change of) liver, not elsewhere classified: Secondary | ICD-10-CM

## 2023-08-02 LAB — COMPREHENSIVE METABOLIC PANEL WITH GFR
ALT: 38 U/L — ABNORMAL HIGH (ref 0–35)
AST: 38 U/L — ABNORMAL HIGH (ref 0–37)
Albumin: 4.6 g/dL (ref 3.5–5.2)
Alkaline Phosphatase: 74 U/L (ref 39–117)
BUN: 21 mg/dL (ref 6–23)
CO2: 29 meq/L (ref 19–32)
Calcium: 9.9 mg/dL (ref 8.4–10.5)
Chloride: 105 meq/L (ref 96–112)
Creatinine, Ser: 0.82 mg/dL (ref 0.40–1.20)
GFR: 66.05 mL/min (ref >60.00)
Glucose, Bld: 114 mg/dL — ABNORMAL HIGH (ref 70–99)
Potassium: 4.2 meq/L (ref 3.5–5.1)
Sodium: 140 meq/L (ref 135–145)
Total Bilirubin: 0.7 mg/dL (ref 0.2–1.2)
Total Protein: 7.7 g/dL (ref 6.0–8.3)

## 2023-08-02 LAB — CBC WITH DIFFERENTIAL/PLATELET
Basophils Absolute: 0 10*3/uL (ref 0.0–0.1)
Basophils Relative: 0.6 % (ref 0.0–3.0)
Eosinophils Absolute: 0.5 10*3/uL (ref 0.0–0.7)
Eosinophils Relative: 5.6 % — ABNORMAL HIGH (ref 0.0–5.0)
HCT: 43 % (ref 36.0–46.0)
Hemoglobin: 14.6 g/dL (ref 12.0–15.0)
Lymphocytes Relative: 18.8 % (ref 12.0–46.0)
Lymphs Abs: 1.5 10*3/uL (ref 0.7–4.0)
MCHC: 33.9 g/dL (ref 30.0–36.0)
MCV: 89.2 fl (ref 78.0–100.0)
Monocytes Absolute: 0.6 10*3/uL (ref 0.1–1.0)
Monocytes Relative: 7.5 % (ref 3.0–12.0)
Neutro Abs: 5.5 10*3/uL (ref 1.4–7.7)
Neutrophils Relative %: 67.5 % (ref 43.0–77.0)
Platelets: 216 10*3/uL (ref 150.0–400.0)
RBC: 4.82 Mil/uL (ref 3.87–5.11)
RDW: 13 % (ref 11.5–15.5)
WBC: 8.1 10*3/uL (ref 4.0–10.5)

## 2023-08-02 LAB — LIPASE: Lipase: 88 U/L — ABNORMAL HIGH (ref 11.0–59.0)

## 2023-08-02 NOTE — Progress Notes (Signed)
 Patient needs Spanish interpreter.  Call and notify patient: 1.  Lipase pancreas test is mildly elevated.  I recommend avoid all alcohol. 2.  AST and ALT liver test are mildly elevated, yet improved from 3 months ago. 3.  Normal kidney function. 4.  Normal CBC.  White count and hemoglobin are normal.  No evidence of infection or anemia.  5.  Continue with plan for abdominal pelvic CT with contrast. Ellouise Console, PA-C

## 2023-08-02 NOTE — Patient Instructions (Addendum)
 Your provider has requested that you go to the basement level for lab work before leaving today. Press B on the elevator. The lab is located at the first door on the left as you exit the elevator.  You have been scheduled for a CT scan of the abdomen and pelvis at [facility], 1st floor Radiology. You are scheduled on 08/09/23 at 12:15 pm. You should arrive 15 minutes prior to your appointment time for registration. If you have any questions regarding your exam or if you need to reschedule, you may call [facility] at [phone] between the hours of 8:00 am and 5:00 pm, Monday-Friday.   Thank you for trusting me with your gastrointestinal care!   Ellouise Console, PA-C  Your provider has ordered Diatherix stool testing for you. You have received a kit from our office today containing all necessary supplies to complete this test. Please carefully read the stool collection instructions provided in the kit before opening the accompanying materials. In addition, be sure there is a label providing your full name and date of birth on the puritan opti-swab tube that is supplied in the kit (if you do not see a label with this information on your test tube, please make us  aware before test collection!). After completing the test, you should secure the purtian tube into the specimen biohazard bag. The [facility] E-Req sheet (including date and time of specimen collection) should be placed into the outside pocket of the specimen biohazard bag and returned to the [facility] lab (basement floor of [facility]) within 3 days of collection. Please make sure to give the specimen to a staff member at the lab. DO NOT leave the specimen on the counter.  If the specimen date and time (can be found in the upper right boxed portion of the sheet) are not filled out on the E-Req sheet, the test will NOT be performed.   Please follow up sooner if symptoms increase or  worsen  Due to recent changes in healthcare laws, you may see the results of your imaging and laboratory studies on MyChart before your provider has had a chance to review them.  We understand that in some cases there may be results that are confusing or concerning to you. Not all laboratory results come back in the same time frame and the provider may be waiting for multiple results in order to interpret others.  Please give us  48 hours in order for your provider to thoroughly review all the results before contacting the office for clarification of your results.  _______________________________________________________  If your blood pressure at your visit was 140/90 or greater, please contact your primary care physician to follow up on this.  _______________________________________________________  If you are age 74 or older, your body mass index should be between 23-30. Your Body mass index is 25.45 kg/m. If this is out of the aforementioned range listed, please consider follow up with your Primary Care Provider.  If you are age 41 or younger, your body mass index should be between 19-25. Your Body mass index is 25.45 kg/m. If this is out of the aformentioned range listed, please consider follow up with your Primary Care Provider.   ________________________________________________________  The [facility] providers would like to encourage you to use MYCHART to communicate with providers for non-urgent requests or questions.  Due to long hold times on the telephone, sending your provider a message by [facility] may be a faster and more efficient  way to get a response.  Please allow 48 business hours for a response.  Please remember that this is for non-urgent requests.  _______________________________________________________

## 2023-08-02 NOTE — Progress Notes (Signed)
 Noted

## 2023-08-06 DIAGNOSIS — R1013 Epigastric pain: Secondary | ICD-10-CM | POA: Diagnosis not present

## 2023-08-09 ENCOUNTER — Ambulatory Visit (HOSPITAL_COMMUNITY)
Admission: RE | Admit: 2023-08-09 | Discharge: 2023-08-09 | Disposition: A | Discharge status: Home / Self Care | Source: Ambulatory Visit | Attending: Physician Assistant | Admitting: Physician Assistant

## 2023-08-09 DIAGNOSIS — K5730 Diverticulosis of large intestine without perforation or abscess without bleeding: Secondary | ICD-10-CM | POA: Diagnosis not present

## 2023-08-09 DIAGNOSIS — R109 Unspecified abdominal pain: Secondary | ICD-10-CM | POA: Diagnosis not present

## 2023-08-09 DIAGNOSIS — R7989 Other specified abnormal findings of blood chemistry: Secondary | ICD-10-CM | POA: Insufficient documentation

## 2023-08-09 DIAGNOSIS — R1032 Left lower quadrant pain: Secondary | ICD-10-CM | POA: Insufficient documentation

## 2023-08-09 DIAGNOSIS — R160 Hepatomegaly, not elsewhere classified: Secondary | ICD-10-CM | POA: Insufficient documentation

## 2023-08-09 DIAGNOSIS — K7689 Other specified diseases of liver: Secondary | ICD-10-CM | POA: Diagnosis not present

## 2023-08-09 DIAGNOSIS — R1013 Epigastric pain: Secondary | ICD-10-CM | POA: Diagnosis not present

## 2023-08-09 MED ORDER — IOHEXOL 9 MG/ML PO SOLN
500.0000 mL | ORAL | Status: AC
  Administered 2023-08-09 (×2): 500 mL via ORAL

## 2023-08-09 MED ORDER — IOHEXOL 300 MG/ML  SOLN
100.0000 mL | Freq: Once | INTRAMUSCULAR | Status: AC | PRN
  Administered 2023-08-09: 100 mL via INTRAVENOUS

## 2023-08-13 NOTE — Progress Notes (Signed)
 Patient needs Spanish interpreter. Call and notify patient abdominal pelvic CT shows: 1.  No acute abnormality. 2.  Stable benign simple liver cysts.  Not worrisome. 3.  Moderate hepatic steatosis, fatty liver disease.  Recommend low-fat diet. 4.  Previous cholecystectomy (gallbladder removed).  Bile duct is dilated due to previous gallbladder surgery. 5.  Severe diverticulosis but NO evidence of diverticulitis infection. 6.  Benign appearing 15 mm nodule along the transverse colon which appears benign. I recommend continue pantoprazole  40 Mg daily for GERD and MiraLAX  for constipation.  Keep follow-up appointment as scheduled. Ellouise Console, PA-C

## 2023-08-21 ENCOUNTER — Ambulatory Visit: Payer: Medicare Other | Admitting: Allergy and Immunology

## 2023-08-21 VITALS — BP 110/60 | HR 90 | Temp 98.3°F | Resp 14 | Ht 58.27 in | Wt 123.1 lb

## 2023-08-21 DIAGNOSIS — J4550 Severe persistent asthma, uncomplicated: Secondary | ICD-10-CM

## 2023-08-21 DIAGNOSIS — J3089 Other allergic rhinitis: Secondary | ICD-10-CM

## 2023-08-21 DIAGNOSIS — K219 Gastro-esophageal reflux disease without esophagitis: Secondary | ICD-10-CM

## 2023-08-21 DIAGNOSIS — R1013 Epigastric pain: Secondary | ICD-10-CM

## 2023-08-21 DIAGNOSIS — R1310 Dysphagia, unspecified: Secondary | ICD-10-CM | POA: Diagnosis not present

## 2023-08-21 MED ORDER — CETIRIZINE HCL 10 MG PO TABS: 10.0000 mg | ORAL_TABLET | Freq: Every day | ORAL | 1 refills | Status: AC | PRN

## 2023-08-21 MED ORDER — ALBUTEROL SULFATE (2.5 MG/3ML) 0.083% IN NEBU: 2.5000 mg | INHALATION_SOLUTION | RESPIRATORY_TRACT | 1 refills | Status: AC | PRN

## 2023-08-21 MED ORDER — PANTOPRAZOLE SODIUM 40 MG PO TBEC: 40.0000 mg | DELAYED_RELEASE_TABLET | Freq: Two times a day (BID) | ORAL | 1 refills | Status: AC

## 2023-08-21 MED ORDER — ALBUTEROL SULFATE HFA 108 (90 BASE) MCG/ACT IN AERS: 2.0000 | INHALATION_SPRAY | RESPIRATORY_TRACT | 1 refills | Status: AC | PRN

## 2023-08-21 MED ORDER — NEBULIZER MASK ADULT MISC: 1.0000 | 1 refills | Status: AC

## 2023-08-21 NOTE — Patient Instructions (Addendum)
  1. RESTART Benralizumab  injections  - dose today  2. If needed:   A.  albuterol  nebulization or ProAir  HFA 2 puffs every 4-6 hours  B.  OTC cetirizine  10 mg tablet 1 time per day  3. Can treat reflux / swallow issue:   A. Pantoprazole  40 mg - 1 tablet 1-2 times per day  B. OTC antacids if needed  4. Influenza = Tamiflu. Covid = Paxlovid  5. Return to clinic in 6 months or earlier if problem

## 2023-08-21 NOTE — Progress Notes (Unsigned)
 Immunotherapy   Patient Details  Name: Anne Warren MRN: [id number] Date of Birth: April 27, 1940  08/21/2023  Anne Warren Rater started injections for  Fasenra  for the second time.  Following schedule: Every twenty eight days for three doses then every sixty days thereafter.   Frequency:Every four weeks for three doses then every eight weeks thereafter.  Epi-Pen:Not needed.  Consent signed in office and patient instructions given.   Anne Warren 08/21/2023, 11:27 AM

## 2023-08-21 NOTE — Progress Notes (Unsigned)
    SUBJECTIVE:   CHIEF COMPLAINT / HPI:   Check Up ***  PERTINENT  PMH / PSH: PAD, HTN, HLD, GERD, mild cognitive impairment, sensorineural hearing loss, cholecystectomy  OBJECTIVE:   There were no vitals taken for this visit.  General: Awake and Alert in NAD HEENT: NCAT. Sclera anicteric. No rhinorrhea. Cardiovascular: RRR. No M/R/G Respiratory: CTAB, normal WOB on RA. No wheezing, crackles, rhonchi, or diminished breath sounds. Abdomen: Soft, non-tender, non-distended. Bowel sounds normoactive/hypoactive/hyperactive. *** Extremities: Able to move all extremities. No BLE edema, no deformities or significant joint findings. Skin: Warm and dry. No abrasions or rashes noted. Neuro: A&Ox***. No focal neurological deficits.  ASSESSMENT/PLAN:   Assessment & Plan      Kathrine Melena, DO [facility]
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
## 2023-08-22 ENCOUNTER — Ambulatory Visit: Admitting: Family Medicine

## 2023-08-22 ENCOUNTER — Encounter: Payer: Self-pay | Admitting: Allergy and Immunology

## 2023-08-22 VITALS — BP 120/69 | HR 73 | Wt 124.8 lb

## 2023-08-22 DIAGNOSIS — E785 Hyperlipidemia, unspecified: Secondary | ICD-10-CM | POA: Diagnosis not present

## 2023-08-22 DIAGNOSIS — J4550 Severe persistent asthma, uncomplicated: Secondary | ICD-10-CM

## 2023-08-22 NOTE — Patient Instructions (Addendum)
 It was great to see you today! Thank you for choosing [facility] for your primary care. Anne Warren was seen for checkup.  Today we addressed: Asthma: Continue seeing the allergist as recommended for the allergy  shots.  He use albuterol  as needed. Cholesterol: Continue taking pravastatin .  I would recommend ezetimibe  as well for your triglycerides, but lifestyle modifications will be beneficial as well.  Make sure you are eating a healthy diet filled with lean protein (chicken, [place]) and vegetables.  Eat minimal amounts of carbohydrates such as rice or pasta to help with your fatty liver.  You should return to our clinic as needed. Please arrive 15 minutes before your appointment to ensure smooth check in process.  We appreciate your efforts in making this happen.  Thank you for allowing me to participate in your care, Kathrine Melena, DO 08/22/2023, 11:38 AM PGY-2, [facility]

## 2023-08-22 NOTE — Assessment & Plan Note (Signed)
 Lipid panel checked 3 months ago which demonstrated LDL of 128 and triglycerides of 216.  Advised lifestyle modifications such as dietary changes and exercise. - Continue pravastatin , encouraged ezetimibe  however patient politely declined since she does not like to take too many medications, but is willing to make lifestyle modifications as able

## 2023-08-22 NOTE — Assessment & Plan Note (Signed)
 Recently asthma has been exacerbated likely due to the weather.  Patient has been seeing her allergist.  Was started on Fasenra  shots.  - Continue seeing allergist for Faserna shots as scheduled and for further treatment options - Continue albuterol  as needed

## 2023-08-23 ENCOUNTER — Telehealth: Payer: Self-pay | Admitting: *Deleted

## 2023-08-23 NOTE — Telephone Encounter (Signed)
 Called patient and advised still approved for PAP and scheduled nest injection with the help of Alexia

## 2023-08-24 ENCOUNTER — Other Ambulatory Visit: Payer: Self-pay

## 2023-08-24 DIAGNOSIS — E785 Hyperlipidemia, unspecified: Secondary | ICD-10-CM

## 2023-08-24 MED ORDER — PRAVASTATIN SODIUM 20 MG PO TABS: 20.0000 mg | ORAL_TABLET | Freq: Every day | ORAL | 3 refills | Status: AC

## 2023-09-10 ENCOUNTER — Encounter: Payer: Self-pay | Admitting: Physician Assistant

## 2023-09-18 ENCOUNTER — Ambulatory Visit

## 2023-09-18 DIAGNOSIS — J4550 Severe persistent asthma, uncomplicated: Secondary | ICD-10-CM | POA: Diagnosis not present
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
## 2023-09-21 ENCOUNTER — Ambulatory Visit: Payer: Self-pay | Admitting: Physician Assistant

## 2023-09-21 ENCOUNTER — Other Ambulatory Visit

## 2023-09-21 ENCOUNTER — Encounter: Payer: Self-pay | Admitting: Physician Assistant

## 2023-09-21 ENCOUNTER — Ambulatory Visit: Admitting: Physician Assistant

## 2023-09-21 VITALS — BP 112/62 | HR 72 | Ht 59.0 in | Wt 125.6 lb

## 2023-09-21 DIAGNOSIS — K589 Irritable bowel syndrome without diarrhea: Secondary | ICD-10-CM | POA: Diagnosis not present

## 2023-09-21 DIAGNOSIS — K7689 Other specified diseases of liver: Secondary | ICD-10-CM

## 2023-09-21 DIAGNOSIS — R748 Abnormal levels of other serum enzymes: Secondary | ICD-10-CM

## 2023-09-21 DIAGNOSIS — K5730 Diverticulosis of large intestine without perforation or abscess without bleeding: Secondary | ICD-10-CM

## 2023-09-21 DIAGNOSIS — K219 Gastro-esophageal reflux disease without esophagitis: Secondary | ICD-10-CM

## 2023-09-21 DIAGNOSIS — K760 Fatty (change of) liver, not elsewhere classified: Secondary | ICD-10-CM | POA: Diagnosis not present

## 2023-09-21 DIAGNOSIS — K5900 Constipation, unspecified: Secondary | ICD-10-CM

## 2023-09-21 DIAGNOSIS — Z9049 Acquired absence of other specified parts of digestive tract: Secondary | ICD-10-CM

## 2023-09-21 DIAGNOSIS — K838 Other specified diseases of biliary tract: Secondary | ICD-10-CM | POA: Diagnosis not present

## 2023-09-21 DIAGNOSIS — R1084 Generalized abdominal pain: Secondary | ICD-10-CM

## 2023-09-21 LAB — LIPASE: Lipase: 75 U/L — ABNORMAL HIGH (ref 11.0–59.0)

## 2023-09-21 NOTE — Progress Notes (Signed)
 Call and notify patient lipase pancreas test has improved from 88 down to 75, yet still slightly elevated.  Normal less than 59.  Continue with plan for abdominal MRI/MRCP as scheduled. Ellouise Console, PA-C

## 2023-09-21 NOTE — Progress Notes (Signed)
 Noted

## 2023-09-21 NOTE — Patient Instructions (Signed)
 Your provider has requested that you go to the basement level for lab work before leaving today. Press B on the elevator. The lab is located at the first door on the left as you exit the elevator.  You have been scheduled for an MRI at [facility] (Radiology 1st floor) on 09/23/23. Your appointment time is 8:00am. Please arrive to admitting (at main entrance of the hospital) 30 minutes prior to your appointment time for registration purposes. Please make certain not to have anything to eat or drink 6 hours prior to your test. In addition, if you have any metal in your body, have a pacemaker or defibrillator, please be sure to let your ordering physician know. This test typically takes 45 minutes to 1 hour to complete. Should you need to reschedule, please call [phone] to do so.  We have sent the following medications to your pharmacy for you to pick up at your convenience: Hyoscyamine 0.125mg  twice daily as needed   Please follow up sooner if symptoms increase or worsen  Due to recent changes in healthcare laws, you may see the results of your imaging and laboratory studies on MyChart before your provider has had a chance to review them.  We understand that in some cases there may be results that are confusing or concerning to you. Not all laboratory results come back in the same time frame and the provider may be waiting for multiple results in order to interpret others.  Please give us  48 hours in order for your provider to thoroughly review all the results before contacting the office for clarification of your results.   Thank you for trusting me with your gastrointestinal care!   Ellouise Console, PA-C _______________________________________________________  If your blood pressure at your visit was 140/90 or greater, please contact your primary care physician to follow up on this.  _______________________________________________________  If you are age 73 or older, your body mass  index should be between 23-30. Your Body mass index is 25.37 kg/m. If this is out of the aforementioned range listed, please consider follow up with your Primary Care Provider.  If you are age 25 or younger, your body mass index should be between 19-25. Your Body mass index is 25.37 kg/m. If this is out of the aformentioned range listed, please consider follow up with your Primary Care Provider.   ________________________________________________________  The [facility] providers would like to encourage you to use MYCHART to communicate with providers for non-urgent requests or questions.  Due to long hold times on the telephone, sending your provider a message by [facility] may be a faster and more efficient way to get a response.  Please allow 48 business hours for a response.  Please remember that this is for non-urgent requests.  _______________________________________________________

## 2023-09-23 ENCOUNTER — Ambulatory Visit (HOSPITAL_COMMUNITY)
Admission: RE | Admit: 2023-09-23 | Discharge: 2023-09-23 | Disposition: A | Discharge status: Home / Self Care | Source: Ambulatory Visit | Attending: Physician Assistant | Admitting: Physician Assistant

## 2023-09-23 ENCOUNTER — Other Ambulatory Visit: Payer: Self-pay | Admitting: Physician Assistant

## 2023-09-23 ENCOUNTER — Encounter (HOSPITAL_COMMUNITY): Payer: Self-pay

## 2023-09-23 DIAGNOSIS — R109 Unspecified abdominal pain: Secondary | ICD-10-CM | POA: Diagnosis not present

## 2023-09-23 DIAGNOSIS — K838 Other specified diseases of biliary tract: Secondary | ICD-10-CM | POA: Diagnosis not present

## 2023-09-23 DIAGNOSIS — R1084 Generalized abdominal pain: Secondary | ICD-10-CM

## 2023-09-23 DIAGNOSIS — K760 Fatty (change of) liver, not elsewhere classified: Secondary | ICD-10-CM | POA: Diagnosis not present

## 2023-09-23 DIAGNOSIS — Z9049 Acquired absence of other specified parts of digestive tract: Secondary | ICD-10-CM | POA: Diagnosis not present

## 2023-09-23 MED ORDER — GADOBUTROL 1 MMOL/ML IV SOLN
6.0000 mL | Freq: Once | INTRAVENOUS | Status: AC | PRN
  Administered 2023-09-23: 6 mL via INTRAVENOUS

## 2023-09-24 ENCOUNTER — Ambulatory Visit: Payer: Self-pay | Admitting: Physician Assistant

## 2023-09-24 MED ORDER — HYOSCYAMINE SULFATE 0.125 MG SL SUBL: 0.1250 mg | SUBLINGUAL_TABLET | Freq: Two times a day (BID) | SUBLINGUAL | 3 refills | Status: AC

## 2023-09-24 NOTE — Progress Notes (Signed)
 Patient needs Engineer, structural.  She does speak some [place]. Call and notify patient abdominal MRI with and without contrast shows: 1.  Mildly enlarged liver with diffuse hepatic steatosis, fatty liver.  I recommend a ow-fat diet. 2.  No worrisome liver lesions or masses. 3.  Bile ducts are dilated due to previous gallbladder surgery.  There are no bile duct stones or worrisome masses. 4. Small 5 mm x 9 mm IPMN in the pancreas.  This is benign.  Recommend optional repeat abdominal MRI/MRCP in 2 years to ensure stability.  No suspicious masses or lesions. Follow-up visit in 6 months for GERD and constipation. Ellouise Console, PA-C

## 2023-09-25 ENCOUNTER — Other Ambulatory Visit (HOSPITAL_COMMUNITY): Payer: Self-pay

## 2023-09-25 ENCOUNTER — Telehealth: Payer: Self-pay

## 2023-09-25 NOTE — Telephone Encounter (Signed)
 Pharmacy Patient Advocate Encounter   Received notification from CoverMyMeds that prior authorization for Hyoscyamine  0.125 MG SL tablets is required/requested.   Insurance verification completed.   The patient is insured through [employer].   Per test claim: DESI drugs not covered. Insurance will not pay.

## 2023-10-16 ENCOUNTER — Ambulatory Visit

## 2023-10-16 DIAGNOSIS — J4550 Severe persistent asthma, uncomplicated: Secondary | ICD-10-CM

## 2023-10-17 DIAGNOSIS — H353132 Nonexudative age-related macular degeneration, bilateral, intermediate dry stage: Secondary | ICD-10-CM | POA: Diagnosis not present

## 2023-11-02 ENCOUNTER — Telehealth: Payer: Self-pay | Admitting: Pharmacist

## 2023-11-02 NOTE — Telephone Encounter (Signed)
 This patient is appearing on a report for being at risk of failing the adherence measure for cholesterol (statin) medications this calendar year.   Medication: Pravastatin  Last fill date: 10/03/23 - unable to verify in Dr. Annemarie or Epic but report lists fill date.  Attempted to contact patient to clarify.  No answer.    Insurance report was not up to date. No action needed at this time.

## 2023-11-13 ENCOUNTER — Ambulatory Visit

## 2023-11-13 DIAGNOSIS — J4550 Severe persistent asthma, uncomplicated: Secondary | ICD-10-CM

## 2023-12-11 ENCOUNTER — Ambulatory Visit

## 2023-12-11 DIAGNOSIS — J4550 Severe persistent asthma, uncomplicated: Secondary | ICD-10-CM | POA: Diagnosis not present

## 2024-01-04 ENCOUNTER — Encounter: Payer: Self-pay | Admitting: Pharmacist

## 2024-01-04 NOTE — Progress Notes (Signed)
 This patient is appearing on a report for being at risk of failing the adherence measure for cholesterol (statin) medications this calendar year.   Medication: pravastatin   Last fill date: 05/2023 for 90 day supply  At PCP visit, patient decision to stop statin due to NOT liking to take too many pills.  Fail QI metric for 2025  Unlikely to restart statin per documentation from 08/22/2023 visit.

## 2024-01-08 ENCOUNTER — Other Ambulatory Visit: Payer: Self-pay | Admitting: *Deleted

## 2024-01-08 ENCOUNTER — Ambulatory Visit

## 2024-01-08 MED ORDER — FASENRA 30 MG/ML ~~LOC~~ SOSY: 30.0000 mg | PREFILLED_SYRINGE | SUBCUTANEOUS | 6 refills | Status: AC

## 2024-02-05 ENCOUNTER — Ambulatory Visit (INDEPENDENT_AMBULATORY_CARE_PROVIDER_SITE_OTHER)

## 2024-02-05 DIAGNOSIS — J4550 Severe persistent asthma, uncomplicated: Secondary | ICD-10-CM

## 2024-02-18 ENCOUNTER — Ambulatory Visit: Admitting: Family Medicine

## 2024-04-01 ENCOUNTER — Ambulatory Visit
